# Patient Record
Sex: Male | Born: 1935
Health system: Southern US, Community
[De-identification: ages and names within clinical notes are randomized; demographics above are authoritative.]

## PROBLEM LIST (undated history)

## (undated) ENCOUNTER — Emergency Department (HOSPITAL_COMMUNITY): Payer: Medicare Other | Source: Home / Self Care

## (undated) DIAGNOSIS — R42 Dizziness and giddiness: Secondary | ICD-10-CM

## (undated) DIAGNOSIS — I2510 Atherosclerotic heart disease of native coronary artery without angina pectoris: Secondary | ICD-10-CM

## (undated) DIAGNOSIS — K219 Gastro-esophageal reflux disease without esophagitis: Secondary | ICD-10-CM

## (undated) DIAGNOSIS — R0600 Dyspnea, unspecified: Secondary | ICD-10-CM

## (undated) DIAGNOSIS — N529 Male erectile dysfunction, unspecified: Secondary | ICD-10-CM

## (undated) DIAGNOSIS — T4145XA Adverse effect of unspecified anesthetic, initial encounter: Secondary | ICD-10-CM

## (undated) DIAGNOSIS — Z86718 Personal history of other venous thrombosis and embolism: Secondary | ICD-10-CM

## (undated) DIAGNOSIS — I2699 Other pulmonary embolism without acute cor pulmonale: Secondary | ICD-10-CM

## (undated) DIAGNOSIS — Z9289 Personal history of other medical treatment: Secondary | ICD-10-CM

## (undated) DIAGNOSIS — J449 Chronic obstructive pulmonary disease, unspecified: Secondary | ICD-10-CM

## (undated) DIAGNOSIS — M1990 Unspecified osteoarthritis, unspecified site: Secondary | ICD-10-CM

## (undated) DIAGNOSIS — I714 Abdominal aortic aneurysm, without rupture, unspecified: Secondary | ICD-10-CM

## (undated) DIAGNOSIS — J189 Pneumonia, unspecified organism: Secondary | ICD-10-CM

## (undated) DIAGNOSIS — E785 Hyperlipidemia, unspecified: Secondary | ICD-10-CM

## (undated) DIAGNOSIS — T8859XA Other complications of anesthesia, initial encounter: Secondary | ICD-10-CM

## (undated) DIAGNOSIS — I219 Acute myocardial infarction, unspecified: Secondary | ICD-10-CM

## (undated) DIAGNOSIS — I10 Essential (primary) hypertension: Secondary | ICD-10-CM

## (undated) DIAGNOSIS — M549 Dorsalgia, unspecified: Secondary | ICD-10-CM

## (undated) HISTORY — DX: Other pulmonary embolism without acute cor pulmonale: I26.99

## (undated) HISTORY — DX: Hyperlipidemia, unspecified: E78.5

## (undated) HISTORY — DX: Dorsalgia, unspecified: M54.9

## (undated) HISTORY — DX: Male erectile dysfunction, unspecified: N52.9

## (undated) HISTORY — PX: OTHER SURGICAL HISTORY: SHX169

## (undated) HISTORY — DX: Personal history of other medical treatment: Z92.89

## (undated) HISTORY — DX: Atherosclerotic heart disease of native coronary artery without angina pectoris: I25.10

## (undated) HISTORY — DX: Abdominal aortic aneurysm, without rupture: I71.4

## (undated) HISTORY — DX: Personal history of other venous thrombosis and embolism: Z86.718

## (undated) HISTORY — PX: TONSILLECTOMY: SUR1361

## (undated) HISTORY — DX: Essential (primary) hypertension: I10

## (undated) HISTORY — DX: Chronic obstructive pulmonary disease, unspecified: J44.9

## (undated) HISTORY — DX: Abdominal aortic aneurysm, without rupture, unspecified: I71.40

---

## 1996-02-25 HISTORY — PX: CORONARY ARTERY BYPASS GRAFT: SHX141

## 1997-08-24 ENCOUNTER — Ambulatory Visit (HOSPITAL_COMMUNITY): Admission: RE | Admit: 1997-08-24 | Discharge: 1997-08-24 | Payer: Self-pay | Admitting: *Deleted

## 1998-07-19 ENCOUNTER — Ambulatory Visit (HOSPITAL_BASED_OUTPATIENT_CLINIC_OR_DEPARTMENT_OTHER): Admission: RE | Admit: 1998-07-19 | Discharge: 1998-07-19 | Payer: Self-pay | Admitting: General Surgery

## 1999-05-27 ENCOUNTER — Emergency Department (HOSPITAL_COMMUNITY): Admission: EM | Admit: 1999-05-27 | Discharge: 1999-05-27 | Payer: Self-pay | Admitting: Internal Medicine

## 2002-06-27 ENCOUNTER — Encounter: Payer: Self-pay | Admitting: Family Medicine

## 2002-06-27 ENCOUNTER — Ambulatory Visit (HOSPITAL_COMMUNITY): Admission: RE | Admit: 2002-06-27 | Discharge: 2002-06-27 | Payer: Self-pay | Admitting: Family Medicine

## 2003-09-07 ENCOUNTER — Ambulatory Visit (HOSPITAL_COMMUNITY): Admission: RE | Admit: 2003-09-07 | Discharge: 2003-09-07 | Payer: Self-pay | Admitting: Gastroenterology

## 2003-09-07 ENCOUNTER — Encounter (INDEPENDENT_AMBULATORY_CARE_PROVIDER_SITE_OTHER): Payer: Self-pay | Admitting: Specialist

## 2005-04-28 ENCOUNTER — Encounter: Payer: Self-pay | Admitting: Cardiology

## 2005-04-28 HISTORY — PX: CARDIOVASCULAR STRESS TEST: SHX262

## 2007-05-20 ENCOUNTER — Ambulatory Visit: Payer: Self-pay | Admitting: Surgery

## 2008-07-14 ENCOUNTER — Emergency Department (HOSPITAL_COMMUNITY): Admission: EM | Admit: 2008-07-14 | Discharge: 2008-07-14 | Payer: Self-pay | Admitting: Emergency Medicine

## 2008-10-19 ENCOUNTER — Encounter: Admission: RE | Admit: 2008-10-19 | Discharge: 2008-11-23 | Payer: Self-pay | Admitting: Orthopedic Surgery

## 2009-10-16 ENCOUNTER — Ambulatory Visit: Payer: Self-pay | Admitting: Cardiovascular Disease

## 2010-03-05 ENCOUNTER — Ambulatory Visit: Payer: Self-pay | Admitting: Cardiovascular Disease

## 2010-06-03 ENCOUNTER — Telehealth: Payer: Self-pay | Admitting: Cardiovascular Disease

## 2010-06-03 NOTE — Telephone Encounter (Signed)
Called for samples, samples provided of micardis hct 80mg /12.5mg .Alfonso Ramus RN
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
## 2010-06-04 LAB — BASIC METABOLIC PANEL
BUN: 23 mg/dL (ref 6–23)
Calcium: 8.8 mg/dL (ref 8.4–10.5)
GFR calc non Af Amer: 52 mL/min — ABNORMAL LOW (ref >60)
Potassium: 3.8 mEq/L (ref 3.5–5.1)
Sodium: 137 mEq/L (ref 135–145)

## 2010-06-04 LAB — DIFFERENTIAL
Eosinophils Absolute: 0.2 10*3/uL (ref 0.0–0.7)
Lymphocytes Relative: 26 % (ref 12–46)
Lymphs Abs: 1.4 10*3/uL (ref 0.7–4.0)
Monocytes Relative: 8 % (ref 3–12)
Neutrophils Relative %: 61 % (ref 43–77)

## 2010-06-04 LAB — CBC
HCT: 40.2 % (ref 39.0–52.0)
Hemoglobin: 13.1 g/dL (ref 13.0–17.0)
Platelets: 212 10*3/uL (ref 150–400)
RDW: 13.4 % (ref 11.5–15.5)
WBC: 5.4 10*3/uL (ref 4.0–10.5)

## 2010-07-09 NOTE — Procedures (Signed)
LOWER EXTREMITY ARTERIAL EVALUATION-EXERCISE WORKSHEET   INDICATION:  Bilateral claudication left greater than right.   HISTORY:  Diabetes:  No.  Cardiac:  CABG in 1998.  Hypertension:  Yes.  Smoking:  No, quit in 1979.  Previous Surgery:   RESTING SYSTOLIC PRESSURES: (ABI)                                RIGHT       LEFT  Brachial:                     140         140  Thigh:  Anterior tibial at ankle:     144         134  Posterior tibial at ankle:    160 (>1.0)  140 (1.0)  Peroneal at ankle:  DOPPLER WAVEFORM ANALYSIS:  Common femoral:  Popliteal:  Anterior tibial:              Triphasic   Triphasic  Posterior tibial:             Triphasic   Triphasic  Peroneal:   EXERCISE  Walking Time:  5 minutes  Speed:  Incline:  Ambulated In Hallway:  X   SYMPTOMS:  Mild bilateral groin discomfort at 4 minutes  IMPRESSION:  1.  No evidence of lower extremity arterial occlusive  disease at rest or with exercise on the right.  2.  Mild drop in left ankle pressures that returns to baseline within 2  minutes, suggesting possible mild left iliac arterial occlusive disease.   A preliminary copy was faxed to Dr. Harvie Bridge office.   POST-EXERCISE  RIGHT                      LEFT  164        immediately     124             2 min.          138             4 min.             6 min.             8 min.             10 min.     ___________________________________________  Di Kindle. Edilia Bo, M.D.   DP/MEDQ  D:  05/20/2007  T:  05/20/2007  Job:  [id number]

## 2010-07-12 NOTE — Op Note (Signed)
NAME:  Brent [name], Brent [name]                           ACCOUNT NO.:  [id number]   MEDICAL RECORD NO.:  [id number]                   PATIENT TYPE:  AMB   LOCATION:  [facility]                                 FACILITY:  [facility]   PHYSICIAN:  Graylin Shiver, M.D.                DATE OF BIRTH:  [birth year]   DATE OF PROCEDURE:  09/07/2003  DATE OF DISCHARGE:                                 OPERATIVE REPORT   PROCEDURE:  Colonoscopy with polypectomy and biopsy.,   INDICATIONS:  Rectal bleeding, family history of colon polyps.   Informed consent was obtained after explanation of the risks of bleeding,  infection, and perforation.   PREMEDICATION:  Fentanyl 60 mcg IV, Versed 6 mg IV.   PROCEDURE:  With the patient in the left lateral decubitus position, a  rectal exam was performed.  No masses were felt.  The Olympus colonoscope  was inserted into the rectum and advanced around the colon to the cecum.  Cecal landmarks were identified.  The cecum and ascending colon were normal.  In the transverse colon there was a small 3 mm sessile polyp biopsied off  with cold forceps.  The descending colon looked normal.  The sigmoid  revealed diverticulosis.  Also in the sigmoid was a 5 mm sessile polyp.  This was snared with a mini-snare and removed by snare cautery technique.  The cautery site looked good and the polyp was retrieved.  The rectum looked  normal.  He tolerated the procedure well without complications.   IMPRESSION:  1. Diverticulosis of the sigmoid colon.  2. Two small colon polyps as described above.   PLAN:  The pathology will be checked.                                               Graylin Shiver, M.D.    SFG/MEDQ  D:  09/07/2003  T:  09/07/2003  Job:  [id number]   cc:   Meredith Staggers, M.D.  [address]  [place]  [place] [address]  Fax: [phone]

## 2010-09-24 ENCOUNTER — Other Ambulatory Visit: Payer: Self-pay | Admitting: Surgery

## 2010-10-25 ENCOUNTER — Other Ambulatory Visit: Payer: Self-pay | Admitting: Cardiovascular Disease

## 2010-11-08 ENCOUNTER — Other Ambulatory Visit: Payer: Self-pay | Admitting: Cardiovascular Disease

## 2010-12-04 ENCOUNTER — Other Ambulatory Visit: Payer: Self-pay | Admitting: Family Medicine

## 2010-12-04 DIAGNOSIS — N5089 Other specified disorders of the male genital organs: Secondary | ICD-10-CM

## 2010-12-16 ENCOUNTER — Ambulatory Visit
Admission: RE | Admit: 2010-12-16 | Discharge: 2010-12-16 | Disposition: A | Discharge status: Home / Self Care | Payer: Medicare Other | Source: Ambulatory Visit | Attending: Family Medicine | Admitting: Family Medicine

## 2010-12-16 DIAGNOSIS — N5089 Other specified disorders of the male genital organs: Secondary | ICD-10-CM

## 2011-01-24 ENCOUNTER — Other Ambulatory Visit: Payer: Self-pay | Admitting: Cardiovascular Disease

## 2011-02-11 ENCOUNTER — Telehealth: Payer: Self-pay | Admitting: Cardiovascular Disease

## 2011-02-11 NOTE — Telephone Encounter (Signed)
Called requesting samples of Crestor 20 mg. He is in donut hole. Have available. Will leave at front desk.

## 2011-02-11 NOTE — Telephone Encounter (Signed)
New message:  Pt needs samples of Crestor to get him thru the doughnut hole.  Please call her and let her know if there are any samples.  [phone]

## 2011-02-26 ENCOUNTER — Other Ambulatory Visit: Payer: Self-pay | Admitting: *Deleted

## 2011-02-26 NOTE — Telephone Encounter (Signed)
Opened in Error.

## 2011-03-10 ENCOUNTER — Encounter: Payer: Self-pay | Admitting: Cardiovascular Disease

## 2011-03-10 ENCOUNTER — Encounter: Payer: Self-pay | Admitting: *Deleted

## 2011-03-17 ENCOUNTER — Ambulatory Visit (INDEPENDENT_AMBULATORY_CARE_PROVIDER_SITE_OTHER): Payer: Medicare Other | Admitting: Cardiovascular Disease

## 2011-03-17 ENCOUNTER — Encounter: Payer: Self-pay | Admitting: Cardiovascular Disease

## 2011-03-17 DIAGNOSIS — I10 Essential (primary) hypertension: Secondary | ICD-10-CM

## 2011-03-17 DIAGNOSIS — E785 Hyperlipidemia, unspecified: Secondary | ICD-10-CM

## 2011-03-17 DIAGNOSIS — I2510 Atherosclerotic heart disease of native coronary artery without angina pectoris: Secondary | ICD-10-CM | POA: Insufficient documentation

## 2011-03-17 LAB — BASIC METABOLIC PANEL
CO2: 27 mEq/L (ref 19–32)
Calcium: 8.9 mg/dL (ref 8.4–10.5)
Chloride: 102 mEq/L (ref 96–112)
Glucose, Bld: 92 mg/dL (ref 70–99)
Sodium: 139 mEq/L (ref 135–145)

## 2011-03-17 LAB — HEPATIC FUNCTION PANEL
Albumin: 3.8 g/dL (ref 3.5–5.2)
Total Bilirubin: 0.5 mg/dL (ref 0.3–1.2)

## 2011-03-17 LAB — LIPID PANEL
HDL: 47 mg/dL (ref >39.00)
LDL Cholesterol: 98 mg/dL (ref 0–99)
Total CHOL/HDL Ratio: 3
VLDL: 15.6 mg/dL (ref 0.0–40.0)

## 2011-03-17 MED ORDER — NITROGLYCERIN 0.4 MG SL SUBL: 0.4000 mg | SUBLINGUAL_TABLET | SUBLINGUAL | Status: DC | PRN

## 2011-03-17 NOTE — Assessment & Plan Note (Signed)
Old Inferior wall MI and severe 3 vessel CAD diagnosed 06/17/1996 by cath.  He had coronary artery bypass grafting shortly after that.  He is done very well. He's not had any episodes of chest pain or shortness of breath. I've asked him to continue to get regular exercise. CABG

## 2011-03-17 NOTE — Progress Notes (Signed)
Brent [name] Date of Birth  January 31, 1932 [facility]     [place] Office  [address]    [address]   [address] [place], [place]  [address]    [place], [place]  [address] [phone]  Fax  [phone]  [phone]  Fax [phone]   History of Present Illness:  Brent [name] is 78 y.o. gentleman with a hx of CAD/CABG, hyperlipidemia, HTN.  He has not had any chest pain.  He has been having lots of left hip pain. He had a steroid injection which helped quite a bit but it appears that the injection has now worn off.  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  Current Outpatient Prescriptions on File Prior to Visit  Medication Sig Dispense Refill  . Acetaminophen (TYLENOL PO) Take by mouth as needed.       Marland Kitchen amLODipine (NORVASC) 5 MG tablet TAKE ONE (1) TABLET EACH DAY  30 tablet  4  . Ascorbic Acid (VITAMIN C PO) Take by mouth daily.      . ASPIRIN PO Take by mouth daily.      . famotidine (PEPCID) 10 MG tablet Take 10 mg by mouth daily.      . Glucosamine-Chondroitin (COSAMIN DS PO) Take by mouth daily.      Marland Kitchen MICARDIS HCT 80-25 MG per tablet TAKE 1 TABLET BY MOUTH ONCE DAILY  30 tablet  1  . Multiple Vitamins-Minerals (MULTIVITAMIN PO) Take by mouth daily.      . Omega-3 Fatty Acids (FISH OIL PO) Take by mouth 2 (two) times daily.      . rosuvastatin (CRESTOR) 20 MG tablet Take 20 mg by mouth daily.          Allergies  Allergen Reactions  . Morphine And Related     Itch     Past Medical History  Diagnosis Date  . HTN (hypertension)   . CAD (coronary artery disease)     Status post CABG  . Hyperlipidemia   . Back pain   . Erectile dysfunction   . History of blood clots     to the lungs   . Pulmonary embolism     Past Surgical History  Procedure Date  . Coronary artery bypass graft 1998    there are sequential 90% stenosis in the proximal LAD, and the mid and distal LAD is a moderate sized vessel. The mid and first diagonal branch has a  50-60% stenosis.   . Cardiovascular stress test 04-28-2005    EF 53%  . Broken jaw     20+ years ago  . Broken ankle     History  Smoking status  . Former Smoker  Smokeless tobacco  . Not on file    History  Alcohol Use     No family history on file.  Reviw of Systems:  Reviewed in the HPI.  All other systems are negative.  Physical Exam: BP 141/79  Pulse 61  Ht 6\' 2"  (1.88 m)  Wt 253 lb 12.8 oz (115.123 kg)  BMI 32.59 kg/m2 The patient is alert and oriented x 3.  The mood and affect are normal.   Skin: warm and dry.  Color is normal.    HEENT:   Normocephalic/atraumatic. His normal carotids. There is no JVD. His neck is supple.  Lungs: Lungs are clear.   Heart: Regular rate S1-S2    Abdomen: Good bowel sounds. No hepatosplenomegaly  Extremities:  No clubbing cyanosis  or edema.  Neuro:  Neuro exam is nonfocal.    ECG: Sinus bradycardia. He has a first degree AV block. There is an old inferior wall myocardial infarction.    Assessment / Plan:

## 2011-03-17 NOTE — Patient Instructions (Signed)
Please have fasting lab work today.  The current medical regimen is effective;  continue present plan and medications.  Follow up in 6 months with Dr Elease Hashimoto.  You will receive a letter in the mail 2 months before you are due.  Please call us when you receive this letter to schedule your follow up appointment.  Your physician recommends that you return for lab work in: 6 months (fasting Lipid, hepatic and BMP)

## 2011-03-17 NOTE — Assessment & Plan Note (Signed)
BP has been fairly well controlled.  

## 2011-03-17 NOTE — Assessment & Plan Note (Signed)
We will draw fasting labs today. I'll see him again in 6 months and we'll draw fasting labs at that time as well.

## 2011-03-18 ENCOUNTER — Telehealth: Payer: Self-pay | Admitting: *Deleted

## 2011-03-18 DIAGNOSIS — E785 Hyperlipidemia, unspecified: Secondary | ICD-10-CM

## 2011-03-18 MED ORDER — ROSUVASTATIN CALCIUM 40 MG PO TABS: 40.0000 mg | ORAL_TABLET | Freq: Every day | ORAL | Status: DC

## 2011-03-18 NOTE — Telephone Encounter (Signed)
Message copied by Antony Odea on Tue Mar 18, 2011  2:51 PM ------      Message from: Vesta Mixer      Created: Mon Mar 17, 2011  5:09 PM       His LDL is too high - goal is < 70.  Please have him increase crestor to 40 a day.            Creatinine is slightly higher, have him drink more water

## 2011-03-18 NOTE — Telephone Encounter (Signed)
Patient called with lab results. meds increased, 3 month fasting lab date set, Pt verbalized understanding. Alfonso Ramus RN

## 2011-04-25 ENCOUNTER — Other Ambulatory Visit: Payer: Self-pay | Admitting: Cardiovascular Disease

## 2011-04-25 NOTE — Telephone Encounter (Signed)
Fax Received. Refill Completed. Brent [name] (R.M.A)   

## 2011-05-28 ENCOUNTER — Other Ambulatory Visit: Payer: Self-pay | Admitting: Family Medicine

## 2011-05-28 ENCOUNTER — Ambulatory Visit
Admission: RE | Admit: 2011-05-28 | Discharge: 2011-05-28 | Disposition: A | Discharge status: Home / Self Care | Payer: Medicare Other | Source: Ambulatory Visit | Attending: Family Medicine | Admitting: Family Medicine

## 2011-05-30 ENCOUNTER — Encounter: Payer: Self-pay | Admitting: *Deleted

## 2011-06-17 ENCOUNTER — Telehealth: Payer: Self-pay | Admitting: *Deleted

## 2011-06-17 ENCOUNTER — Other Ambulatory Visit (INDEPENDENT_AMBULATORY_CARE_PROVIDER_SITE_OTHER): Payer: Medicare Other

## 2011-06-17 ENCOUNTER — Encounter: Payer: Self-pay | Admitting: *Deleted

## 2011-06-17 DIAGNOSIS — E785 Hyperlipidemia, unspecified: Secondary | ICD-10-CM

## 2011-06-17 LAB — LIPID PANEL
HDL: 49.9 mg/dL (ref >39.00)
Total CHOL/HDL Ratio: 3
Triglycerides: 89 mg/dL (ref 0.0–149.0)
VLDL: 17.8 mg/dL (ref 0.0–40.0)

## 2011-06-17 LAB — BASIC METABOLIC PANEL
CO2: 27 mEq/L (ref 19–32)
Chloride: 105 mEq/L (ref 96–112)
Glucose, Bld: 101 mg/dL — ABNORMAL HIGH (ref 70–99)
Potassium: 4.2 mEq/L (ref 3.5–5.1)
Sodium: 140 mEq/L (ref 135–145)

## 2011-06-17 LAB — HEPATIC FUNCTION PANEL
Albumin: 4 g/dL (ref 3.5–5.2)
Total Bilirubin: 0.5 mg/dL (ref 0.3–1.2)

## 2011-06-17 NOTE — Telephone Encounter (Signed)
Brent [name] was seen in January for follow up of CAD.  He has not had any angina.  He should be at low risk for his upcoming endoscopic procedure.  I would prefer that he continue aspirin 81 mg a day if that is possible.    Vesta Mixer, Montez Hageman., MD, [facility] 06/17/2011, 11:24 AM

## 2011-06-17 NOTE — Telephone Encounter (Signed)
Noted, pt informed, letter sent.

## 2011-06-17 NOTE — Telephone Encounter (Signed)
Pt walked in, needs cardiac clearance for general anesthesia/ over night stay for Endoscopic Zenkers diverticulectomy. Dr Pollyann Kennedy needs the clearance. Does he need an app, last ov 03/17/11, please review/advise.

## 2011-06-23 ENCOUNTER — Encounter (HOSPITAL_COMMUNITY): Payer: Self-pay | Admitting: Pharmacy Technician

## 2011-06-24 ENCOUNTER — Encounter (HOSPITAL_COMMUNITY)
Admission: RE | Admit: 2011-06-24 | Discharge: 2011-06-24 | Disposition: A | Discharge status: Home / Self Care | Payer: Medicare Other | Source: Ambulatory Visit | Attending: Otolaryngology | Admitting: Otolaryngology

## 2011-06-24 ENCOUNTER — Encounter (HOSPITAL_COMMUNITY): Payer: Self-pay

## 2011-06-24 ENCOUNTER — Encounter (HOSPITAL_COMMUNITY)
Admission: RE | Admit: 2011-06-24 | Discharge: 2011-06-24 | Disposition: A | Discharge status: Home / Self Care | Payer: Medicare Other | Source: Ambulatory Visit | Attending: Anesthesiology | Admitting: Anesthesiology

## 2011-06-24 LAB — CBC
HCT: 39.3 % (ref 39.0–52.0)
Platelets: 195 10*3/uL (ref 150–400)
RBC: 4.27 MIL/uL (ref 4.22–5.81)
RDW: 13.1 % (ref 11.5–15.5)
WBC: 5.3 10*3/uL (ref 4.0–10.5)

## 2011-06-24 LAB — SURGICAL PCR SCREEN
MRSA, PCR: NEGATIVE
Staphylococcus aureus: NEGATIVE

## 2011-06-24 LAB — BASIC METABOLIC PANEL
CO2: 24 mEq/L (ref 19–32)
Chloride: 102 mEq/L (ref 96–112)
Creatinine, Ser: 1.52 mg/dL — ABNORMAL HIGH (ref 0.50–1.35)
GFR calc Af Amer: 49 mL/min — ABNORMAL LOW (ref >90)
Sodium: 139 mEq/L (ref 135–145)

## 2011-06-24 MED ORDER — CEFAZOLIN SODIUM 1-5 GM-% IV SOLN: 1.0000 g | INTRAVENOUS | Status: DC

## 2011-06-24 MED ORDER — CEFAZOLIN SODIUM-DEXTROSE 2-3 GM-% IV SOLR
2.0000 g | INTRAVENOUS | Status: AC
  Administered 2011-06-25: 2 g via INTRAVENOUS
  Filled 2011-06-24: qty 50

## 2011-06-24 NOTE — H&P (Signed)
  EJ PINSON is an 78 y.o. male.   Chief Complaint: Dysphagia HPI: One year history of dysphagia, Zenker's found on barium swallow.  Past Medical History  Diagnosis Date  . HTN (hypertension)   . CAD (coronary artery disease)     Status post CABG  . Hyperlipidemia   . Back pain   . Erectile dysfunction   . History of blood clots     to the lungs   . Pulmonary embolism     Past Surgical History  Procedure Date  . Coronary artery bypass graft 1998    there are sequential 90% stenosis in the proximal LAD, and the mid and distal LAD is a moderate sized vessel. The mid and first diagonal branch has a 50-60% stenosis.   . Cardiovascular stress test 04-28-2005    EF 53%  . Broken jaw     20+ years ago  . Broken ankle     No family history on file. Social History:  reports that he has quit smoking. He does not have any smokeless tobacco history on file. His alcohol and drug histories not on file.  Allergies:  Allergies  Allergen Reactions  . Morphine And Related     Itch     No prescriptions prior to admission    No results found for this or any previous visit (from the past 48 hour(s)). No results found.  ROS: otherwise negative  There were no vitals taken for this visit.  PHYSICAL EXAM: Overall appearance:  Healthy appearing, in no distress Head:  Normocephalic, atraumatic. Ears: External auditory canals are clear; tympanic membranes are intact and the middle ears are free of any effusion. Nose: External nose is healthy in appearance. Internal nasal exam free of any lesions or obstruction. Oral Cavity:  There are no mucosal lesions or masses identified. Small mouth, slightly retrognathic. Oral Pharynx/Hypopharynx/Larynx: no signs of any mucosal lesions or masses identified. Vocal cords move normally. Neuro:  No identifiable neurologic deficits. Neck: No palpable neck masses.  Studies Reviewed: none    Assessment/Plan Endoscopic Zenker's  diverticulectomy.  Miaa Latterell 06/24/2011, 9:01 AM

## 2011-06-25 ENCOUNTER — Encounter (HOSPITAL_COMMUNITY): Payer: Self-pay | Admitting: Anesthesiology

## 2011-06-25 ENCOUNTER — Ambulatory Visit (HOSPITAL_COMMUNITY): Payer: Medicare Other | Admitting: Anesthesiology

## 2011-06-25 ENCOUNTER — Encounter (HOSPITAL_COMMUNITY)
Admission: RE | Disposition: A | Discharge status: Home / Self Care | Payer: Self-pay | Source: Ambulatory Visit | Attending: Otolaryngology

## 2011-06-25 ENCOUNTER — Ambulatory Visit (HOSPITAL_COMMUNITY)
Admission: RE | Admit: 2011-06-25 | Discharge: 2011-06-25 | Disposition: A | Discharge status: Home / Self Care | Payer: Medicare Other | Source: Ambulatory Visit | Attending: Otolaryngology | Admitting: Otolaryngology

## 2011-06-25 ENCOUNTER — Encounter (HOSPITAL_COMMUNITY): Payer: Self-pay | Admitting: *Deleted

## 2011-06-25 DIAGNOSIS — R0602 Shortness of breath: Secondary | ICD-10-CM | POA: Insufficient documentation

## 2011-06-25 DIAGNOSIS — Z01812 Encounter for preprocedural laboratory examination: Secondary | ICD-10-CM | POA: Insufficient documentation

## 2011-06-25 DIAGNOSIS — I2510 Atherosclerotic heart disease of native coronary artery without angina pectoris: Secondary | ICD-10-CM | POA: Insufficient documentation

## 2011-06-25 DIAGNOSIS — Z5309 Procedure and treatment not carried out because of other contraindication: Secondary | ICD-10-CM | POA: Insufficient documentation

## 2011-06-25 DIAGNOSIS — K225 Diverticulum of esophagus, acquired: Secondary | ICD-10-CM

## 2011-06-25 DIAGNOSIS — E119 Type 2 diabetes mellitus without complications: Secondary | ICD-10-CM | POA: Insufficient documentation

## 2011-06-25 DIAGNOSIS — I10 Essential (primary) hypertension: Secondary | ICD-10-CM | POA: Insufficient documentation

## 2011-06-25 DIAGNOSIS — N289 Disorder of kidney and ureter, unspecified: Secondary | ICD-10-CM | POA: Insufficient documentation

## 2011-06-25 HISTORY — PX: ESOPHAGOSCOPY: SHX5534

## 2011-06-25 SURGERY — ESOPHAGOSCOPY
Anesthesia: General | Site: Mouth | Wound class: Clean Contaminated

## 2011-06-25 MED ORDER — NEOSTIGMINE METHYLSULFATE 1 MG/ML IJ SOLN
INTRAMUSCULAR | Status: DC | PRN
  Administered 2011-06-25: 5 mg via INTRAVENOUS

## 2011-06-25 MED ORDER — PROPOFOL 10 MG/ML IV EMUL
INTRAVENOUS | Status: DC | PRN
  Administered 2011-06-25: 30 mg via INTRAVENOUS
  Administered 2011-06-25: 150 mg via INTRAVENOUS

## 2011-06-25 MED ORDER — GLYCOPYRROLATE 0.2 MG/ML IJ SOLN
INTRAMUSCULAR | Status: DC | PRN
  Administered 2011-06-25: .8 mg via INTRAVENOUS

## 2011-06-25 MED ORDER — DROPERIDOL 2.5 MG/ML IJ SOLN: 0.6250 mg | INTRAMUSCULAR | Status: DC | PRN

## 2011-06-25 MED ORDER — ROCURONIUM BROMIDE 100 MG/10ML IV SOLN
INTRAVENOUS | Status: DC | PRN
  Administered 2011-06-25: 10 mg via INTRAVENOUS
  Administered 2011-06-25: 50 mg via INTRAVENOUS

## 2011-06-25 MED ORDER — ONDANSETRON HCL 4 MG/2ML IJ SOLN
INTRAMUSCULAR | Status: DC | PRN
  Administered 2011-06-25: 4 mg via INTRAVENOUS

## 2011-06-25 MED ORDER — HYDROMORPHONE HCL PF 1 MG/ML IJ SOLN: 0.2500 mg | INTRAMUSCULAR | Status: DC | PRN

## 2011-06-25 MED ORDER — SUCCINYLCHOLINE CHLORIDE 20 MG/ML IJ SOLN
INTRAMUSCULAR | Status: DC | PRN
  Administered 2011-06-25: 120 mg via INTRAVENOUS

## 2011-06-25 MED ORDER — MIDAZOLAM HCL 5 MG/5ML IJ SOLN
INTRAMUSCULAR | Status: DC | PRN
  Administered 2011-06-25: 1 mg via INTRAVENOUS

## 2011-06-25 MED ORDER — LACTATED RINGERS IV SOLN
INTRAVENOUS | Status: DC | PRN
  Administered 2011-06-25: 08:00:00 via INTRAVENOUS

## 2011-06-25 MED ORDER — FENTANYL CITRATE 0.05 MG/ML IJ SOLN
INTRAMUSCULAR | Status: DC | PRN
  Administered 2011-06-25 (×3): 50 ug via INTRAVENOUS

## 2011-06-25 MED ORDER — EPHEDRINE SULFATE 50 MG/ML IJ SOLN
INTRAMUSCULAR | Status: DC | PRN
  Administered 2011-06-25 (×2): 5 mg via INTRAVENOUS

## 2011-06-25 MED ORDER — 0.9 % SODIUM CHLORIDE (POUR BTL) OPTIME
TOPICAL | Status: DC | PRN
  Administered 2011-06-25: 1000 mL

## 2011-06-25 SURGICAL SUPPLY — 26 items
CATH ROBINSON RED A/P 18FR (CATHETERS) ×2 IMPLANT
CLOTH BEACON ORANGE TIMEOUT ST (SAFETY) ×3 IMPLANT
COVER SURGICAL LIGHT HANDLE (MISCELLANEOUS) ×3 IMPLANT
COVER TABLE BACK 60X90 (DRAPES) ×3 IMPLANT
CUTTER LINEAR ENDO 35 ETS (STAPLE) ×1 IMPLANT
DRAPE PROXIMA HALF (DRAPES) ×3 IMPLANT
ELECT PAD GROUND ADT 9 (MISCELLANEOUS) ×2 IMPLANT
GLOVE BIO SURGEON STRL SZ 6.5 (GLOVE) ×2 IMPLANT
GLOVE BIOGEL PI IND STRL 6.5 (GLOVE) ×2 IMPLANT
GLOVE BIOGEL PI IND STRL 8 (GLOVE) ×1 IMPLANT
GLOVE BIOGEL PI INDICATOR 6.5 (GLOVE) ×1
GLOVE BIOGEL PI INDICATOR 8 (GLOVE) ×1
GLOVE ECLIPSE 7.5 STRL STRAW (GLOVE) ×3 IMPLANT
GOWN STRL NON-REIN LRG LVL3 (GOWN DISPOSABLE) ×8 IMPLANT
GUARD TEETH (MISCELLANEOUS) ×5 IMPLANT
KIT ROOM TURNOVER OR (KITS) ×3 IMPLANT
NS IRRIG 1000ML POUR BTL (IV SOLUTION) ×3 IMPLANT
PAD ARMBOARD 7.5X6 YLW CONV (MISCELLANEOUS) ×6 IMPLANT
RELOAD CUTTER ETS 35MM STAND (ENDOMECHANICALS) IMPLANT
SOLUTION ANTI FOG 6CC (MISCELLANEOUS) ×3 IMPLANT
SPONGE GAUZE 4X4 12PLY (GAUZE/BANDAGES/DRESSINGS) ×3 IMPLANT
SURGILUBE 2OZ TUBE FLIPTOP (MISCELLANEOUS) ×3 IMPLANT
TOWEL OR 17X24 6PK STRL BLUE (TOWEL DISPOSABLE) ×3 IMPLANT
TOWEL OR 17X26 10 PK STRL BLUE (TOWEL DISPOSABLE) ×2 IMPLANT
TUBE CONNECTING 12X1/4 (SUCTIONS) ×3 IMPLANT
WATER STERILE IRR 1000ML POUR (IV SOLUTION) ×3 IMPLANT

## 2011-06-25 NOTE — Anesthesia Postprocedure Evaluation (Signed)
  Anesthesia Post-op Note  Patient: Brent [name]  Procedure(s) Performed: Procedure(s) (LRB): ESOPHAGOSCOPY (N/A)  Patient Location: PACU  Anesthesia Type: General  Level of Consciousness: awake, alert  and oriented  Airway and Oxygen Therapy: Patient Spontanous Breathing  Post-op Pain: none  Post-op Assessment: Post-op Vital signs reviewed, Patient's Cardiovascular Status Stable, Respiratory Function Stable, Patent Airway, No signs of Nausea or vomiting, Adequate PO intake, Pain level controlled and No headache  Post-op Vital Signs: Reviewed and stable  Complications: No apparent anesthesia complications

## 2011-06-25 NOTE — Interval H&P Note (Signed)
History and Physical Interval Note:  06/25/2011 8:25 AM  Brent [name]  has presented today for surgery, with the diagnosis of ZINKER'S DIVERTICULUM  The various methods of treatment have been discussed with the patient and family. After consideration of risks, benefits and other options for treatment, the patient has consented to  Procedure(s) (LRB): ZENKER'S DIVERTICULECTOMY ENDOSCOPIC (N/A) as a surgical intervention .  The patients' history has been reviewed, patient examined, no change in status, stable for surgery.  I have reviewed the patients' chart and labs.  Questions were answered to the patient's satisfaction.     Selen Smucker

## 2011-06-25 NOTE — Transfer of Care (Signed)
Immediate Anesthesia Transfer of Care Note  Patient: Brent [name]  Procedure(s) Performed: Procedure(s) (LRB): ESOPHAGOSCOPY (N/A)  Patient Location: PACU  Anesthesia Type: General  Level of Consciousness: awake  Airway & Oxygen Therapy: Patient Spontanous Breathing and Patient connected to nasal cannula oxygen  Post-op Assessment: Report given to PACU RN and Post -op Vital signs reviewed and stable  Post vital signs: Reviewed and stable  Complications: No apparent anesthesia complications

## 2011-06-25 NOTE — Preoperative (Signed)
Beta Blockers   Reason not to administer Beta Blockers:Not Applicable 

## 2011-06-25 NOTE — Addendum Note (Signed)
Addendum  created 06/25/11 1059 by Remonia Richter, MD   Modules edited:Anesthesia Attestations

## 2011-06-25 NOTE — Op Note (Signed)
OPERATIVE REPORT  DATE OF SURGERY: 06/25/2011  PATIENT:  Brent [name],  78 y.o. male  PRE-OPERATIVE DIAGNOSIS:  ZINKER'S DIVERTICULUM  POST-OPERATIVE DIAGNOSIS:  ZINKER'S DIVERTICULUM  PROCEDURE:  Procedure(s): ZENKER'S DIVERTICULECTOMY ENDOSCOPIC  SURGEON:  Susy Frizzle, MD  ASSISTANTS: none   ANESTHESIA:   general  EBL:  0 ml  DRAINS: none   LOCAL MEDICATIONS USED:  NONE  SPECIMEN:  No Specimen  COUNTS:  YES  PROCEDURE DETAILS: The patient was taken to the operating room and placed on the operating table in the supine position. Following induction of general endotracheal anesthesia, the table was turned 90. The patient was draped in a standard fashion with head drape and sterile drapes. Maxillary and mandibular tooth protector were used throughout the case. The Wierda diverticuloscope was entered into the oral cavity and multiple attempts were made to position the blades of the scope around the cricopharyngeus. The patient had a small mouth, full dentition, with slightly retrognathic, and neck mobility was limited, thus making it very difficult to position the scope properly to visualize the diverticulum and the cricopharyngeus. Attempts were made with a red rubber catheter, 18-gauge, passing down the esophagus, as well as using a Jako laryngoscope and a cervical esophagoscope to help confirm the positioning of the esophagus, but all these efforts were still unsuccessful. The case was aborted. The scope was removed and the patient was awakened, extubated and transferred to recovery in stable condition.   PATIENT DISPOSITION:  PACU - hemodynamically stable.

## 2011-06-25 NOTE — Anesthesia Preprocedure Evaluation (Addendum)
Anesthesia Evaluation  Patient identified by MRN, date of birth, ID band Patient awake    Reviewed: Allergy & Precautions, H&P , NPO status , Patient's Chart, lab work & pertinent test results  Airway Mallampati: II TM Distance: >3 FB     Dental  (+) Dental Advisory Given   Pulmonary shortness of breath and with exertion,  breath sounds clear to auscultation  Pulmonary exam normal       Cardiovascular hypertension, Pt. on medications + CAD Rhythm:Regular Rate:Normal  S/p CABG 13 years ago denies chest pain does not use Nitro saw cardiologist last month    Neuro/Psych    GI/Hepatic negative GI ROS, Neg liver ROS, Zenkers Diverticulum    Endo/Other  Diabetes mellitus-, Oral Hypoglycemic Agents  Renal/GU Renal InsufficiencyRenal disease     Musculoskeletal   Abdominal   Peds  Hematology   Anesthesia Other Findings   Reproductive/Obstetrics                         Anesthesia Physical Anesthesia Plan  ASA: III  Anesthesia Plan: General   Post-op Pain Management:    Induction: Intravenous  Airway Management Planned: Oral ETT  Additional Equipment:   Intra-op Plan:   Post-operative Plan: Extubation in OR  Informed Consent:   Dental advisory given  Plan Discussed with: Anesthesiologist, CRNA and Surgeon  Anesthesia Plan Comments:        Anesthesia Quick Evaluation

## 2011-06-25 NOTE — Anesthesia Procedure Notes (Signed)
Procedure Name: Intubation Date/Time: 06/25/2011 8:34 AM Performed by: Sherie Don Pre-anesthesia Checklist: Patient identified, Emergency Drugs available, Suction available, Patient being monitored and Timeout performed Patient Re-evaluated:Patient Re-evaluated prior to inductionOxygen Delivery Method: Circle system utilized Preoxygenation: Pre-oxygenation with 100% oxygen Intubation Type: Rapid sequence, Cricoid Pressure applied and IV induction Laryngoscope Size: Miller and 2 Grade View: Grade III Tube type: Oral Tube size: 7.5 mm Number of attempts: 1 Airway Equipment and Method: Stylet Placement Confirmation: ETT inserted through vocal cords under direct vision,  positive ETCO2 and breath sounds checked- equal and bilateral Secured at: 24 cm Tube secured with: Tape Dental Injury: Teeth and Oropharynx as per pre-operative assessment and Injury to lip

## 2011-06-25 NOTE — Discharge Instructions (Signed)
Resume a regular diet as tolerated. I will make arrangements for consultation at one of the local universities.

## 2011-06-26 ENCOUNTER — Encounter (HOSPITAL_COMMUNITY): Payer: Self-pay | Admitting: Otolaryngology

## 2011-08-20 ENCOUNTER — Other Ambulatory Visit: Payer: Self-pay | Admitting: *Deleted

## 2011-08-20 MED ORDER — AMLODIPINE BESYLATE 5 MG PO TABS: 5.0000 mg | ORAL_TABLET | Freq: Every day | ORAL | Status: DC

## 2011-08-20 NOTE — Telephone Encounter (Signed)
Opened in Error.

## 2011-08-20 NOTE — Telephone Encounter (Signed)
Fax Received. Refill Completed. Dameon Soltis Chowoe (R.M.A)   

## 2011-09-18 ENCOUNTER — Other Ambulatory Visit: Payer: Self-pay | Admitting: Cardiovascular Disease

## 2011-09-18 NOTE — Telephone Encounter (Signed)
..   Requested Prescriptions   Pending Prescriptions Disp Refills  . CRESTOR 40 MG tablet [Pharmacy Med Name: rosuvastatin (CRESTOR) 40 MG tablet] 30 tablet 3    Sig: TAKE 1 TABLET BY MOUTH DAILY  Please call the office to make appointment

## 2011-10-22 ENCOUNTER — Other Ambulatory Visit: Payer: Self-pay | Admitting: Cardiovascular Disease

## 2011-10-22 MED ORDER — TELMISARTAN 80 MG PO TABS: 80.0000 mg | ORAL_TABLET | Freq: Every day | ORAL | Status: DC

## 2011-10-22 NOTE — Telephone Encounter (Signed)
Fax Received. Refill Completed. Brent [name] (R.M.A)   

## 2011-11-26 ENCOUNTER — Encounter: Payer: Self-pay | Admitting: Cardiovascular Disease

## 2012-01-20 ENCOUNTER — Other Ambulatory Visit: Payer: Self-pay | Admitting: *Deleted

## 2012-01-20 MED ORDER — ROSUVASTATIN CALCIUM 40 MG PO TABS: 40.0000 mg | ORAL_TABLET | Freq: Every day | ORAL | Status: DC

## 2012-01-20 NOTE — Telephone Encounter (Signed)
Pt needs appointment then refill can be made Fax Received. Refill Completed. Epiphany Seltzer Chowoe (R.M.A)   

## 2012-03-17 ENCOUNTER — Other Ambulatory Visit: Payer: Self-pay | Admitting: *Deleted

## 2012-03-17 MED ORDER — AMLODIPINE BESYLATE 5 MG PO TABS: 5.0000 mg | ORAL_TABLET | Freq: Every day | ORAL | Status: DC

## 2012-03-17 NOTE — Telephone Encounter (Signed)
Fax Received. Refill Completed. Trystin Hargrove Chowoe (R.M.A)   

## 2012-04-01 ENCOUNTER — Telehealth: Payer: Self-pay | Admitting: Cardiovascular Disease

## 2012-04-01 DIAGNOSIS — E785 Hyperlipidemia, unspecified: Secondary | ICD-10-CM

## 2012-04-01 NOTE — Telephone Encounter (Signed)
LAB DATE SET/ ORDERS SENT

## 2012-04-01 NOTE — Telephone Encounter (Signed)
Pt wants to know if he needs blood work at next visit? pls call  [phone]

## 2012-04-16 ENCOUNTER — Other Ambulatory Visit (INDEPENDENT_AMBULATORY_CARE_PROVIDER_SITE_OTHER): Payer: Medicare Other

## 2012-04-16 DIAGNOSIS — E785 Hyperlipidemia, unspecified: Secondary | ICD-10-CM

## 2012-04-16 LAB — BASIC METABOLIC PANEL
CO2: 27 mEq/L (ref 19–32)
Calcium: 9.1 mg/dL (ref 8.4–10.5)
Creatinine, Ser: 1.5 mg/dL (ref 0.4–1.5)
GFR: 49.97 mL/min — ABNORMAL LOW (ref >60.00)

## 2012-04-16 LAB — LIPID PANEL
Cholesterol: 146 mg/dL (ref 0–200)
Total CHOL/HDL Ratio: 3
Triglycerides: 107 mg/dL (ref 0.0–149.0)

## 2012-04-16 LAB — HEPATIC FUNCTION PANEL
AST: 23 U/L (ref 0–37)
Albumin: 3.7 g/dL (ref 3.5–5.2)
Alkaline Phosphatase: 76 U/L (ref 39–117)

## 2012-04-19 ENCOUNTER — Ambulatory Visit (INDEPENDENT_AMBULATORY_CARE_PROVIDER_SITE_OTHER): Payer: Medicare Other | Admitting: Cardiovascular Disease

## 2012-04-19 ENCOUNTER — Encounter: Payer: Self-pay | Admitting: Cardiovascular Disease

## 2012-04-19 VITALS — BP 142/84 | HR 61 | Ht 74.0 in | Wt 256.4 lb

## 2012-04-19 DIAGNOSIS — I2510 Atherosclerotic heart disease of native coronary artery without angina pectoris: Secondary | ICD-10-CM

## 2012-04-19 DIAGNOSIS — R0609 Other forms of dyspnea: Secondary | ICD-10-CM

## 2012-04-19 DIAGNOSIS — R0600 Dyspnea, unspecified: Secondary | ICD-10-CM

## 2012-04-19 DIAGNOSIS — Z951 Presence of aortocoronary bypass graft: Secondary | ICD-10-CM

## 2012-04-19 MED ORDER — POTASSIUM CHLORIDE ER 10 MEQ PO TBCR: EXTENDED_RELEASE_TABLET | ORAL | Status: DC

## 2012-04-19 MED ORDER — FUROSEMIDE 40 MG PO TABS: ORAL_TABLET | ORAL | Status: DC

## 2012-04-19 NOTE — Progress Notes (Signed)
Brent [name] Date of Birth  November 24, 1931 [facility]     [place] Office  [address]    [address]   [address] [place], [place]  [address]    [place], [place]  [address] [phone]  Fax  [phone]  [phone]  Fax [phone]   Problem List 1. CAD / CABG - 2000 2. HTN 3. Hyperlipidemia  History of Present Illness:  Brent [name] is 79 y.o. gentleman with a hx of CAD/CABG, hyperlipidemia, HTN.  He has not had any chest pain.  He has been having lots of left hip pain. He had a steroid injection which helped quite a bit but it appears that the injection has now worn off.  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  Feb. 24, 2014:  He has been having some dyspnea with exercise.  He has lots of orthopedic problems.  He is exercising some some but not as much as he would like.   Current Outpatient Prescriptions on File Prior to Visit  Medication Sig Dispense Refill  . amLODipine (NORVASC) 5 MG tablet Take 1 tablet (5 mg total) by mouth daily.  30 tablet  5  . aspirin EC 81 MG tablet Take 81 mg by mouth daily.      . diphenhydrAMINE (ALLERGY) 25 MG tablet Take 25 mg by mouth 2 (two) times daily.      . Glucosamine-Chondroit-Vit C-Mn (GLUCOSAMINE 1500 COMPLEX PO) Take 1 tablet by mouth 2 (two) times daily.      . meclizine (ANTIVERT) 25 MG tablet Take 25 mg by mouth every morning.      . Multiple Vitamins-Minerals (MENS 50+ ADVANCED PO) Take 1 tablet by mouth daily.      . nitroGLYCERIN (NITROSTAT) 0.4 MG SL tablet Place 0.4 mg under the tongue every 5 (five) minutes as needed. For chest pain      . Omega-3 Fatty Acids (FISH OIL PO) Take 1 capsule by mouth 2 (two) times daily.       Marland Kitchen omeprazole (PRILOSEC) 20 MG capsule Take 20 mg by mouth 2 (two) times daily.      . rosuvastatin (CRESTOR) 40 MG tablet Take 1 tablet (40 mg total) by mouth daily.  30 tablet  3  . telmisartan (MICARDIS) 80 MG tablet Take 1 tablet (80 mg total) by mouth daily.  30 tablet  5   . traMADol (ULTRAM) 50 MG tablet Take 50 mg by mouth every 6 (six) hours as needed. pain      . vitamin C (ASCORBIC ACID) 500 MG tablet Take 1,000 mg by mouth daily.       No current facility-administered medications on file prior to visit.    Allergies  Allergen Reactions  . Morphine And Related     Itch     Past Medical History  Diagnosis Date  . HTN (hypertension)   . CAD (coronary artery disease)     Status post CABG  . Hyperlipidemia   . Back pain   . Erectile dysfunction   . History of blood clots     to the lungs   . Pulmonary embolism     Past Surgical History  Procedure Laterality Date  . Coronary artery bypass graft  1998    there are sequential 90% stenosis in the proximal LAD, and the mid and distal LAD is a moderate sized vessel. The mid and first diagonal branch has a 50-60% stenosis.   . Cardiovascular stress test  04-28-2005    EF 53%  . Broken jaw      20+ years ago  . Broken ankle    . Esophagoscopy  06/25/2011    Procedure: ESOPHAGOSCOPY;  Surgeon: Serena Colonel, MD;  Location: [facility] OR;  Service: ENT;  Laterality: N/A;  Direct Esophagoscopy    History  Smoking status  . Former Smoker  Smokeless tobacco  . Not on file    History  Alcohol Use  . 0.6 oz/week  . 1 Glasses of wine per week    No family history on file.  Reviw of Systems:  Reviewed in the HPI.  All other systems are negative.  Physical Exam: BP 142/84  Pulse 61  Ht 6\' 2"  (1.88 m)  Wt 256 lb 6.4 oz (116.302 kg)  BMI 32.91 kg/m2 The patient is alert and oriented x 3.  The mood and affect are normal.   Skin: warm and dry.  Color is normal.    HEENT:   Normocephalic/atraumatic. His normal carotids. There is no JVD. His neck is supple.  Lungs: fine rales in the bases bilaterally   Heart: Regular rate S1-S2    Abdomen: Good bowel sounds. No hepatosplenomegaly  Extremities:  No clubbing cyanosis or edema.  Neuro:  Neuro exam is nonfocal.    ECG: Feb. 24, 2014:  Sinus  bradycardia at 53.  Marland Kitchen He has a first degree AV block. There is an old inferior wall myocardial infarction.    Assessment / Plan:

## 2012-04-19 NOTE — Assessment & Plan Note (Signed)
Brent [name] presents today for followup visit. He has been having some shortness breath particularly with exertion. He also has bilateral rales on exam.  His original presenting symptoms approximately 15 years ago included shortness breath. He never had any significant episodes of chest pain. I worry that this may be an angina equivalent.  Will get an echocardiogram for further evaluation of his left ventricular function. We will also schedule him for a stress Myoview study to rule out ischemia. He has had an inferior wall myocardial infarction so we may expect an inferior scar on Myoview study.  Will start him on Lasix 40 mg and potassium chloride 10 mEq on Mondays, Wednesdays, and Fridays. I'll see him back in 3 months for followup visit. We'll check a basic metabolic profile on him during that time.

## 2012-04-19 NOTE — Patient Instructions (Addendum)
Your physician has requested that you have en exercise stress myoview. For further information please visit [email or web address] Please follow instruction sheet, as given.  Your physician has requested that you have an echocardiogram. Echocardiography is a painless test that uses sound waves to create images of your heart. It provides your doctor with information about the size and shape of your heart and how well your heart's chambers and valves are working. This procedure takes approximately one hour. There are no restrictions for this procedure.  Your physician recommends that you schedule a follow-up appointment in: 3 MONTH WITH BMP   Your physician has recommended you make the following change in your medication:   START LASIX 40 MG ON Monday, Wednesday, Friday MORNING START POTASSIUM (K-DUR) 10 MEQ ON Monday, Wednesday, Friday MORNING

## 2012-04-27 ENCOUNTER — Other Ambulatory Visit (HOSPITAL_COMMUNITY): Payer: Medicare Other

## 2012-04-29 ENCOUNTER — Ambulatory Visit (HOSPITAL_COMMUNITY): Payer: Medicare Other | Attending: Cardiology | Admitting: Radiology

## 2012-04-29 ENCOUNTER — Other Ambulatory Visit: Payer: Self-pay | Admitting: *Deleted

## 2012-04-29 VITALS — Ht 74.0 in | Wt 252.0 lb

## 2012-04-29 DIAGNOSIS — R0989 Other specified symptoms and signs involving the circulatory and respiratory systems: Secondary | ICD-10-CM | POA: Insufficient documentation

## 2012-04-29 DIAGNOSIS — R0600 Dyspnea, unspecified: Secondary | ICD-10-CM

## 2012-04-29 DIAGNOSIS — R0602 Shortness of breath: Secondary | ICD-10-CM

## 2012-04-29 DIAGNOSIS — Z951 Presence of aortocoronary bypass graft: Secondary | ICD-10-CM

## 2012-04-29 DIAGNOSIS — I2510 Atherosclerotic heart disease of native coronary artery without angina pectoris: Secondary | ICD-10-CM | POA: Insufficient documentation

## 2012-04-29 DIAGNOSIS — R0609 Other forms of dyspnea: Secondary | ICD-10-CM | POA: Insufficient documentation

## 2012-04-29 MED ORDER — TECHNETIUM TC 99M SESTAMIBI GENERIC - CARDIOLITE
11.0000 | Freq: Once | INTRAVENOUS | Status: AC | PRN
  Administered 2012-04-29: 11 via INTRAVENOUS

## 2012-04-29 MED ORDER — TELMISARTAN 80 MG PO TABS: 80.0000 mg | ORAL_TABLET | Freq: Every day | ORAL | Status: DC

## 2012-04-29 MED ORDER — TECHNETIUM TC 99M SESTAMIBI GENERIC - CARDIOLITE
33.0000 | Freq: Once | INTRAVENOUS | Status: AC | PRN
  Administered 2012-04-29: 33 via INTRAVENOUS

## 2012-04-29 NOTE — Telephone Encounter (Signed)
Fax Received. Refill Completed. Marvie Brevik Chowoe (R.M.A)   

## 2012-04-29 NOTE — Progress Notes (Signed)
[facility] SITE 3 NUCLEAR MED [address] [place], [place] [address] [phone]    Cardiology Nuclear Med Study  Brent [name] is a 79 y.o. male     MRN : [id number]     DOB: 03-19-1931  Procedure Date: 04/29/2012  Nuclear Med Background Indication for Stress Test:  Evaluation for Ischemia and Graft Patency History:  '98 Heart Catheterization> CABG, 03-07 Myocardial Perfusion Study-No ischemia, inferior scar EF=53% Cardiac Risk Factors: Family History - CAD, History of Smoking, Hypertension and Lipids  Symptoms:  DOE, Fatigue, Fatigue with Exertion and SOB   Nuclear Pre-Procedure Caffeine/Decaff Intake:  None NPO After: 7:30pm   Lungs:  clear O2 Sat: 95% on room air. IV 0.9% NS with Angio Cath:  22g  IV Site: R Hand  IV Started by:  Bonnita Levan, RN  Chest Size (in):  48 Cup Size: n/a  Height: 6\' 2"  (1.88 m)  Weight:  252 lb (114.306 kg)  BMI:  Body mass index is 32.34 kg/(m^2). Tech Comments:  N/A    Nuclear Med Study 1 or 2 day study: 1 day  Stress Test Type:  Stress  Reading MD: Cassell Clement, MD  Order Authorizing Provider:  Kristeen Miss, MD  Resting Radionuclide: Technetium 104m Sestamibi  Resting Radionuclide Dose: 11.0 mCi   Stress Radionuclide:  Technetium 1m Sestamibi  Stress Radionuclide Dose: 33.0 mCi           Stress Protocol Rest HR: 58 Stress HR: 134  Rest BP: 142/79 Stress BP: 210/65  Exercise Time (min): 5:16 METS: 7.0   Predicted Max HR: 142 bpm % Max HR: 94.37 bpm Rate Pressure Product: 21308   Dose of Adenosine (mg):  n/a Dose of Lexiscan: n/a mg  Dose of Atropine (mg): n/a Dose of Dobutamine: n/a mcg/kg/min (at max HR)  Stress Test Technologist: Irean Hong, RN  Nuclear Technologist:  Domenic Polite, CNMT     Rest Procedure:  Myocardial perfusion imaging was performed at rest 45 minutes following the intravenous administration of Technetium 77m Sestamibi. Rest ECG: NSR with non-specific ST-T wave changes  Stress  Procedure:  The patient exercised on the treadmill utilizing the Bruce Protocol for 5:16 minutes, RPE=15. The patient stopped due to DOE,(L) hip pain10/10, and denied any chest pain. The patient had a hypertensive response to exercise. Technetium 73m Sestamibi was injected at peak exercise and myocardial perfusion imaging was performed after a brief delay. Stress ECG: No significant change from baseline ECG  QPS Raw Data Images:  Normal; no motion artifact; normal heart/lung ratio. Stress Images:  Normal homogeneous uptake in all areas of the myocardium. Normal apical thinning. Rest Images:  Normal homogeneous uptake in all areas of the myocardium. Subtraction (SDS):  No evidence of ischemia. Transient Ischemic Dilatation (Normal <1.22):  1.00 Lung/Heart Ratio (Normal <0.45):  0.47  Quantitative Gated Spect Images QGS EDV:  145 ml QGS ESV:  62 ml  Impression Exercise Capacity:  Good exercise capacity. BP Response:  Normal blood pressure response. Clinical Symptoms:  No chest pain. ECG Impression:  No significant ST segment change suggestive of ischemia. Comparison with Prior Nuclear Study: No images to compare  Overall Impression:  Normal stress nuclear study.  LV Ejection Fraction: 57%.  LV Wall Motion:  NL LV Function; NL Wall Motion  [employer]

## 2012-05-05 ENCOUNTER — Ambulatory Visit (HOSPITAL_COMMUNITY): Payer: Medicare Other | Attending: Cardiovascular Disease | Admitting: Radiology

## 2012-05-05 DIAGNOSIS — I079 Rheumatic tricuspid valve disease, unspecified: Secondary | ICD-10-CM | POA: Insufficient documentation

## 2012-05-05 DIAGNOSIS — E669 Obesity, unspecified: Secondary | ICD-10-CM | POA: Insufficient documentation

## 2012-05-05 DIAGNOSIS — R0989 Other specified symptoms and signs involving the circulatory and respiratory systems: Secondary | ICD-10-CM | POA: Insufficient documentation

## 2012-05-05 DIAGNOSIS — R0600 Dyspnea, unspecified: Secondary | ICD-10-CM

## 2012-05-05 DIAGNOSIS — R0609 Other forms of dyspnea: Secondary | ICD-10-CM | POA: Insufficient documentation

## 2012-05-05 DIAGNOSIS — E785 Hyperlipidemia, unspecified: Secondary | ICD-10-CM | POA: Insufficient documentation

## 2012-05-05 DIAGNOSIS — I2510 Atherosclerotic heart disease of native coronary artery without angina pectoris: Secondary | ICD-10-CM

## 2012-05-05 DIAGNOSIS — Z951 Presence of aortocoronary bypass graft: Secondary | ICD-10-CM

## 2012-05-05 NOTE — Progress Notes (Signed)
Echocardiogram performed.  

## 2012-05-07 ENCOUNTER — Telehealth: Payer: Self-pay | Admitting: Cardiovascular Disease

## 2012-05-07 NOTE — Telephone Encounter (Signed)
done

## 2012-05-07 NOTE — Telephone Encounter (Signed)
New Problem:     Patient called in wanting to know the results of his latest ECHO/ Stress Test.  Please call back.

## 2012-05-31 MED ORDER — ROSUVASTATIN CALCIUM 40 MG PO TABS: 40.0000 mg | ORAL_TABLET | Freq: Every day | ORAL | Status: DC

## 2012-05-31 NOTE — Telephone Encounter (Signed)
msg left that the refill was sent to pharmacy and can contact them for pick up, told to call with concerns,

## 2012-05-31 NOTE — Telephone Encounter (Signed)
New problem    Pt is going out of town and will be gone for several days pt only has 2 Crestor pills left with no refills pt want to know what is the fastest option  if they can pick up 1 pack of Crestor for the week or if a prescription was called in can they pick it up today by 5pm. Please call pt concerning this matter.

## 2012-07-16 ENCOUNTER — Other Ambulatory Visit: Payer: Medicare Other

## 2012-07-20 ENCOUNTER — Ambulatory Visit: Payer: Medicare Other | Admitting: Cardiovascular Disease

## 2012-08-18 ENCOUNTER — Other Ambulatory Visit: Payer: Self-pay | Admitting: Otolaryngology

## 2012-08-18 DIAGNOSIS — R1310 Dysphagia, unspecified: Secondary | ICD-10-CM

## 2012-08-25 ENCOUNTER — Ambulatory Visit
Admission: RE | Admit: 2012-08-25 | Discharge: 2012-08-25 | Disposition: A | Discharge status: Home / Self Care | Payer: Medicare Other | Source: Ambulatory Visit | Attending: Otolaryngology | Admitting: Otolaryngology

## 2012-08-25 DIAGNOSIS — R1310 Dysphagia, unspecified: Secondary | ICD-10-CM

## 2012-09-20 ENCOUNTER — Other Ambulatory Visit: Payer: Self-pay | Admitting: *Deleted

## 2012-09-20 MED ORDER — AMLODIPINE BESYLATE 5 MG PO TABS: 5.0000 mg | ORAL_TABLET | Freq: Every day | ORAL | Status: DC

## 2012-09-24 ENCOUNTER — Other Ambulatory Visit: Payer: Self-pay | Admitting: *Deleted

## 2012-09-24 MED ORDER — TELMISARTAN 80 MG PO TABS: 80.0000 mg | ORAL_TABLET | Freq: Every day | ORAL | Status: DC

## 2012-10-29 ENCOUNTER — Ambulatory Visit: Payer: Medicare Other | Admitting: Cardiovascular Disease

## 2012-11-08 ENCOUNTER — Telehealth: Payer: Self-pay | Admitting: Cardiovascular Disease

## 2012-11-08 NOTE — Telephone Encounter (Signed)
New problem   Pt is having a Endoscopy procedure  and need to know if he can be off of Ecotrin for 10 days and he need to know by tomorrow.

## 2012-11-08 NOTE — Telephone Encounter (Signed)
Pt was told he can be off asa for 5-7 days, asked him to have his Surgeon send  Request if anything further is needed. He agreed to plan.

## 2012-11-09 NOTE — Telephone Encounter (Signed)
New problem   Brent [name]/Dr Kristine Garbe  Pt will be having sx on 11/16/12 and they need cardiac clearance and how long can pt stop Asprin. Please fax to [phone]

## 2012-11-09 NOTE — Telephone Encounter (Signed)
LM for Brent [name] [phone] pt was told he could hold ASA 5-7 days prior to procedure, she can fax request for cardiac clearance form to Dr Elease Hashimoto [phone] if more information needed.

## 2012-11-16 ENCOUNTER — Telehealth: Payer: Self-pay | Admitting: *Deleted

## 2012-11-16 NOTE — Telephone Encounter (Signed)
Received request from [facility] // Dr Kristine Garbe for cardiac clearance, left a msg on their triage line requesting to know what procedure he is having and if done under general. Requested a call back.

## 2012-11-26 ENCOUNTER — Other Ambulatory Visit: Payer: Self-pay

## 2012-11-26 MED ORDER — AMLODIPINE BESYLATE 5 MG PO TABS: 5.0000 mg | ORAL_TABLET | Freq: Every day | ORAL | Status: DC

## 2012-12-30 ENCOUNTER — Ambulatory Visit (INDEPENDENT_AMBULATORY_CARE_PROVIDER_SITE_OTHER): Payer: Medicare Other | Admitting: Cardiovascular Disease

## 2012-12-30 ENCOUNTER — Encounter: Payer: Self-pay | Admitting: Cardiovascular Disease

## 2012-12-30 VITALS — BP 110/70 | HR 62 | Ht 74.0 in | Wt 243.0 lb

## 2012-12-30 DIAGNOSIS — I2510 Atherosclerotic heart disease of native coronary artery without angina pectoris: Secondary | ICD-10-CM

## 2012-12-30 DIAGNOSIS — E785 Hyperlipidemia, unspecified: Secondary | ICD-10-CM

## 2012-12-30 NOTE — Assessment & Plan Note (Signed)
His blood pressure stable. Continue same medications.

## 2012-12-30 NOTE — Progress Notes (Signed)
Brent [name] Date of Birth  1931/06/20 [facility]     [place] Office  [address]    [address]   [address] [place], [place]  [address]    [place], [place]  [address] [phone]  Fax  [phone]  [phone]  Fax [phone]   Problem List 1. CAD / CABG - 2000 2. HTN 3. Hyperlipidemia  History of Present Illness:  Brent [name] is 79 y.o. gentleman with a hx of CAD/CABG, hyperlipidemia, HTN.  He has not had any chest pain.  He has been having lots of left hip pain. He had a steroid injection which helped quite a bit but it appears that the injection has now worn off.  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  Feb. 24, 2014:  He has been having some dyspnea with exercise.  He has lots of orthopedic problems.  He is exercising some some but not as much as he would like.  Nov. 6 ,2014:  Brent [name] is doing well.  He recently had a laser treatment for an esophageal issue.  No cardiac issues.    Feeling well.   Still working on a 1942 car.   He has lost 10 lbs.     Current Outpatient Prescriptions on File Prior to Visit  Medication Sig Dispense Refill  . amLODipine (NORVASC) 5 MG tablet Take 1 tablet (5 mg total) by mouth daily.  30 tablet  1  . aspirin EC 81 MG tablet Take 81 mg by mouth daily.      . diphenhydrAMINE (ALLERGY) 25 MG tablet Take 25 mg by mouth 2 (two) times daily.      . furosemide (LASIX) 40 MG tablet 1 TABLET ON Monday, Wednesday, FRIDAY  15 tablet  6  . Glucosamine-Chondroit-Vit C-Mn (GLUCOSAMINE 1500 COMPLEX PO) Take 1 tablet by mouth 2 (two) times daily.      . meclizine (ANTIVERT) 25 MG tablet Take 25 mg by mouth every morning.      . Multiple Vitamins-Minerals (MENS 50+ ADVANCED PO) Take 1 tablet by mouth daily.      . nitroGLYCERIN (NITROSTAT) 0.4 MG SL tablet Place 0.4 mg under the tongue every 5 (five) minutes as needed. For chest pain      . Omega-3 Fatty Acids (FISH OIL PO) Take 1 capsule by mouth 2 (two) times daily.        Marland Kitchen omeprazole (PRILOSEC) 20 MG capsule Take 20 mg by mouth 2 (two) times daily.      . potassium chloride (K-DUR) 10 MEQ tablet 1 TABLET Monday, Wednesday, FRIDAY  15 tablet  6  . rosuvastatin (CRESTOR) 40 MG tablet Take 1 tablet (40 mg total) by mouth daily.  30 tablet  11  . telmisartan (MICARDIS) 80 MG tablet Take 1 tablet (80 mg total) by mouth daily.  30 tablet  5  . traMADol (ULTRAM) 50 MG tablet Take 50 mg by mouth every 6 (six) hours as needed. pain      . vitamin C (ASCORBIC ACID) 500 MG tablet Take 1,000 mg by mouth daily.       No current facility-administered medications on file prior to visit.    Allergies  Allergen Reactions  . Morphine And Related     Itch     Past Medical History  Diagnosis Date  . HTN (hypertension)   . CAD (coronary artery disease)     Status post CABG  . Hyperlipidemia   . Back pain   .  Erectile dysfunction   . History of blood clots     to the lungs   . Pulmonary embolism     Past Surgical History  Procedure Laterality Date  . Coronary artery bypass graft  1998    there are sequential 90% stenosis in the proximal LAD, and the mid and distal LAD is a moderate sized vessel. The mid and first diagonal branch has a 50-60% stenosis.   . Cardiovascular stress test  04-28-2005    EF 53%  . Broken jaw      20+ years ago  . Broken ankle    . Esophagoscopy  06/25/2011    Procedure: ESOPHAGOSCOPY;  Surgeon: Serena Colonel, MD;  Location: [facility] OR;  Service: ENT;  Laterality: N/A;  Direct Esophagoscopy    History  Smoking status  . Former Smoker  Smokeless tobacco  . Not on file    History  Alcohol Use  . 0.6 oz/week  . 1 Glasses of wine per week    No family history on file.  Reviw of Systems:  Reviewed in the HPI.  All other systems are negative.  Physical Exam: BP 110/70  Pulse 62  Ht 6\' 2"  (1.88 m)  Wt 243 lb (110.224 kg)  BMI 31.19 kg/m2 The patient is alert and oriented x 3.  The mood and affect are normal.   Skin: warm and dry.   Color is normal.    HEENT:   Normocephalic/atraumatic. His normal carotids. There is no JVD. His neck is supple.  Lungs: fine rales in the bases bilaterally   Heart: Regular rate S1-S2    Abdomen: Good bowel sounds. No hepatosplenomegaly  Extremities:  No clubbing cyanosis or edema.  Neuro:  Neuro exam is nonfocal.    ECG: Feb. 24, 2014:  Sinus bradycardia at 53.  Marland Kitchen He has a first degree AV block. There is an old inferior wall myocardial infarction.    Assessment / Plan:

## 2012-12-30 NOTE — Patient Instructions (Signed)
Your physician wants you to follow-up in: 6 months with EKG  You will receive a reminder letter in the mail two months in advance. If you don't receive a letter, please call our office to schedule the follow-up appointment.   Your physician recommends that you return for a FASTING lipid profile: today

## 2012-12-30 NOTE — Assessment & Plan Note (Signed)
Brent [name] is doing well. He's not having episodes of chest pain. He has been walking more recently. He's lost 10 pounds. We'll check his fasting lipids / liver/ bmp today. I'll see him again in 6 months for followup office visit, EKG, and fasting labs.

## 2012-12-31 LAB — LIPID PANEL
HDL: 38.3 mg/dL — ABNORMAL LOW (ref >39.00)
Total CHOL/HDL Ratio: 4

## 2012-12-31 LAB — BASIC METABOLIC PANEL
BUN: 20 mg/dL (ref 6–23)
CO2: 26 mEq/L (ref 19–32)
Calcium: 9 mg/dL (ref 8.4–10.5)
Creatinine, Ser: 1.3 mg/dL (ref 0.4–1.5)
GFR: 57.6 mL/min — ABNORMAL LOW (ref >60.00)
Glucose, Bld: 99 mg/dL (ref 70–99)

## 2012-12-31 LAB — HEPATIC FUNCTION PANEL
AST: 27 U/L (ref 0–37)
Alkaline Phosphatase: 80 U/L (ref 39–117)
Bilirubin, Direct: 0.1 mg/dL (ref 0.0–0.3)
Total Bilirubin: 0.5 mg/dL (ref 0.3–1.2)

## 2013-01-28 ENCOUNTER — Other Ambulatory Visit: Payer: Self-pay

## 2013-01-28 DIAGNOSIS — I2510 Atherosclerotic heart disease of native coronary artery without angina pectoris: Secondary | ICD-10-CM

## 2013-01-28 DIAGNOSIS — Z951 Presence of aortocoronary bypass graft: Secondary | ICD-10-CM

## 2013-01-28 DIAGNOSIS — R0600 Dyspnea, unspecified: Secondary | ICD-10-CM

## 2013-01-28 MED ORDER — FUROSEMIDE 40 MG PO TABS: ORAL_TABLET | ORAL | Status: DC

## 2013-01-28 MED ORDER — POTASSIUM CHLORIDE ER 10 MEQ PO TBCR: EXTENDED_RELEASE_TABLET | ORAL | Status: DC

## 2013-01-28 MED ORDER — AMLODIPINE BESYLATE 5 MG PO TABS: 5.0000 mg | ORAL_TABLET | Freq: Every day | ORAL | Status: DC

## 2013-04-29 ENCOUNTER — Other Ambulatory Visit: Payer: Self-pay

## 2013-04-29 MED ORDER — TELMISARTAN 80 MG PO TABS: 80.0000 mg | ORAL_TABLET | Freq: Every day | ORAL | Status: DC

## 2013-05-17 ENCOUNTER — Telehealth: Payer: Self-pay | Admitting: Cardiovascular Disease

## 2013-05-17 NOTE — Telephone Encounter (Signed)
New message     FYI Dr Onnie Graham will be faxing to [place] a clearance for shoulder replacement.  Surgery is pending Dr [name] clearance

## 2013-05-17 NOTE — Telephone Encounter (Signed)
Noted/// will watch for clearance request.

## 2013-05-18 ENCOUNTER — Telehealth: Payer: Self-pay | Admitting: Cardiovascular Disease

## 2013-05-18 NOTE — Telephone Encounter (Signed)
ARRIVED/ TAKEN TO MR TO BE FAXED.

## 2013-05-18 NOTE — Telephone Encounter (Signed)
Received request from Nurse fax box, documents faxed for surgical clearance. To: [employer] Fax number: [phone] Attention: [phone]/[phone]

## 2013-05-20 ENCOUNTER — Encounter (HOSPITAL_COMMUNITY): Payer: Self-pay | Admitting: Pharmacy Technician

## 2013-05-25 NOTE — Pre-Procedure Instructions (Signed)
Brent [name]  05/25/2013   Your procedure is scheduled on:  Thurs, April 9 @ 7:30 AM  Report to [facility]  at 5:30 AM.  Call this number if you have problems the morning of surgery: [phone]   Remember:   Do not eat food or drink liquids after midnight.   Take these medicines the morning of surgery with A SIP OF WATER: Amlodipine(Norvasc),Antivert(Meclizine),Omeprazole(Prilosec),and Tramadol(Ultram-if needed)               Stop taking your Aspirin and Fish Oil. No Goody's,BC's,Aleve,Ibuprofen,or any Herbal Medications   Do not wear jewelry  Do not wear lotions, powders, or colognes You may wear deodorant.  Men may shave face and neck.  Do not bring valuables to the hospital.  [facility] is not responsible                  for any belongings or valuables.               Contacts, dentures or bridgework may not be worn into surgery.  Leave suitcase in the car. After surgery it may be brought to your room.  For patients admitted to the hospital, discharge time is determined by your                treatment team.                Special Instructions:  [place] - Preparing for Surgery  Before surgery, you can play an important role.  Because skin is not sterile, your skin needs to be as free of germs as possible.  You can reduce the number of germs on you skin by washing with CHG (chlorahexidine gluconate) soap before surgery.  CHG is an antiseptic cleaner which kills germs and bonds with the skin to continue killing germs even after washing.  Please DO NOT use if you have an allergy to CHG or antibacterial soaps.  If your skin becomes reddened/irritated stop using the CHG and inform your nurse when you arrive at Short Stay.  Do not shave (including legs and underarms) for at least 48 hours prior to the first CHG shower.  You may shave your face.  Please follow these instructions carefully:   1.  Shower with CHG Soap the night before surgery and the                                 morning of Surgery.  2.  If you choose to wash your hair, wash your hair first as usual with your       normal shampoo.  3.  After you shampoo, rinse your hair and body thoroughly to remove the                      Shampoo.  4.  Use CHG as you would any other liquid soap.  You can apply chg directly       to the skin and wash gently with scrungie or a clean washcloth.  5.  Apply the CHG Soap to your body ONLY FROM THE NECK DOWN.        Do not use on open wounds or open sores.  Avoid contact with your eyes,       ears, mouth and genitals (private parts).  Wash genitals (private parts)       with your normal soap.  6.  Wash thoroughly, paying special attention to the area where your surgery        will be performed.  7.  Thoroughly rinse your body with warm water from the neck down.  8.  DO NOT shower/wash with your normal soap after using and rinsing off       the CHG Soap.  9.  Pat yourself dry with a clean towel.            10.  Wear clean pajamas.            11.  Place clean sheets on your bed the night of your first shower and do not        sleep with pets.  Day of Surgery  Do not apply any lotions/deoderants the morning of surgery.  Please wear clean clothes to the hospital/surgery center.     Please read over the following fact sheets that you were given: Pain Booklet, Coughing and Deep Breathing, Blood Transfusion Information and Surgical Site Infection Prevention

## 2013-05-26 ENCOUNTER — Encounter (HOSPITAL_COMMUNITY): Payer: Self-pay

## 2013-05-26 ENCOUNTER — Inpatient Hospital Stay (HOSPITAL_COMMUNITY)
Admission: RE | Admit: 2013-05-26 | Discharge: 2013-05-26 | Disposition: A | Discharge status: Home / Self Care | Payer: Medicare Other | Source: Ambulatory Visit

## 2013-05-26 HISTORY — DX: Dizziness and giddiness: R42

## 2013-05-26 HISTORY — DX: Gastro-esophageal reflux disease without esophagitis: K21.9

## 2013-05-26 NOTE — Progress Notes (Addendum)
Cardiologist is Dr.Nahser-clearance in hard chart    Echo in epic from 2014  Stress test in epic from 2007 and 2014

## 2013-05-27 ENCOUNTER — Other Ambulatory Visit: Payer: Self-pay

## 2013-05-27 MED ORDER — ROSUVASTATIN CALCIUM 40 MG PO TABS: 40.0000 mg | ORAL_TABLET | Freq: Every day | ORAL | Status: DC

## 2013-05-31 ENCOUNTER — Encounter (HOSPITAL_COMMUNITY)
Admission: RE | Admit: 2013-05-31 | Discharge: 2013-05-31 | Disposition: A | Discharge status: Home / Self Care | Payer: Medicare HMO | Source: Ambulatory Visit | Attending: Orthopedic Surgery | Admitting: Orthopedic Surgery

## 2013-05-31 ENCOUNTER — Encounter (HOSPITAL_COMMUNITY)
Admission: RE | Admit: 2013-05-31 | Discharge: 2013-05-31 | Disposition: A | Discharge status: Home / Self Care | Payer: Medicare HMO | Source: Ambulatory Visit | Attending: Anesthesiology | Admitting: Anesthesiology

## 2013-05-31 ENCOUNTER — Encounter (HOSPITAL_COMMUNITY): Payer: Self-pay

## 2013-05-31 HISTORY — DX: Unspecified osteoarthritis, unspecified site: M19.90

## 2013-05-31 HISTORY — DX: Acute myocardial infarction, unspecified: I21.9

## 2013-05-31 LAB — COMPREHENSIVE METABOLIC PANEL
ALT: 12 U/L (ref 0–53)
AST: 19 U/L (ref 0–37)
Albumin: 3.6 g/dL (ref 3.5–5.2)
Alkaline Phosphatase: 88 U/L (ref 39–117)
BUN: 24 mg/dL — AB (ref 6–23)
CALCIUM: 9.2 mg/dL (ref 8.4–10.5)
CO2: 27 mEq/L (ref 19–32)
Chloride: 103 mEq/L (ref 96–112)
Creatinine, Ser: 1.31 mg/dL (ref 0.50–1.35)
GFR calc non Af Amer: 50 mL/min — ABNORMAL LOW (ref >90)
GFR, EST AFRICAN AMERICAN: 58 mL/min — AB (ref >90)
GLUCOSE: 106 mg/dL — AB (ref 70–99)
Potassium: 4.6 mEq/L (ref 3.7–5.3)
Sodium: 144 mEq/L (ref 137–147)
TOTAL PROTEIN: 7.4 g/dL (ref 6.0–8.3)
Total Bilirubin: 0.4 mg/dL (ref 0.3–1.2)

## 2013-05-31 LAB — CBC WITH DIFFERENTIAL/PLATELET
Basophils Absolute: 0 10*3/uL (ref 0.0–0.1)
Basophils Relative: 1 % (ref 0–1)
EOS ABS: 0.3 10*3/uL (ref 0.0–0.7)
EOS PCT: 5 % (ref 0–5)
HCT: 40.4 % (ref 39.0–52.0)
HEMOGLOBIN: 13.2 g/dL (ref 13.0–17.0)
LYMPHS ABS: 2.2 10*3/uL (ref 0.7–4.0)
Lymphocytes Relative: 34 % (ref 12–46)
MCH: 30.4 pg (ref 26.0–34.0)
MCHC: 32.7 g/dL (ref 30.0–36.0)
MCV: 93.1 fL (ref 78.0–100.0)
MONOS PCT: 7 % (ref 3–12)
Monocytes Absolute: 0.5 10*3/uL (ref 0.1–1.0)
Neutro Abs: 3.6 10*3/uL (ref 1.7–7.7)
Neutrophils Relative %: 53 % (ref 43–77)
Platelets: 242 10*3/uL (ref 150–400)
RBC: 4.34 MIL/uL (ref 4.22–5.81)
RDW: 13.5 % (ref 11.5–15.5)
WBC: 6.6 10*3/uL (ref 4.0–10.5)

## 2013-05-31 LAB — PROTIME-INR
INR: 1.06 (ref 0.00–1.49)
Prothrombin Time: 13.6 seconds (ref 11.6–15.2)

## 2013-05-31 LAB — APTT: aPTT: 25 seconds (ref 24–37)

## 2013-05-31 LAB — ABO/RH: ABO/RH(D): A NEG

## 2013-05-31 NOTE — Progress Notes (Signed)
Anesthesia Chart Review:  Patient is a 80 year old males scheduled for right total shoulder on 06/02/13 by Dr. Onnie Graham.  History includes former smoker, PE (date unknown, but prior to 05/2011), HTN, GERD, CAD/MI s/p CABG '98, arthritis, tonsillectomy, ED, jaw fracture > 20 years ago, post-operative N/V. BMI is 33.3 consistent with obesity. PCP is listed as [name]. Cardiologist is Dr. Acie Fredrickson who cleared him with low for this procedure. He did recommend that patient continue ASA.   Echo on 05/05/12 showed: - Left ventricle: The cavity size was mildly dilated. Wall thickness was increased in a pattern of mild LVH. Systolic function was mildly to moderately reduced. The estimated ejection fraction was in the range of 40% to 45%. - Left atrium: The atrium was moderately dilated. - Right ventricle: Systolic function was mildly reduced. - Right atrium: The atrium was mildly to moderately dilated. - Pulmonary arteries: PA peak pressure: 46mm Hg (S). - Tricuspid valve: Trivial regurgitation.  Nuclear stress test on 04/29/12 showed: Overall Impression: Normal stress nuclear study. LV Ejection Fraction: 57%. LV Wall Motion: NL LV Function; NL Wall Motion.  EKG on 05/31/13 showed SR with first degree AVB, inferior infarct (age undetermined), cannot rule out anterior infarct (age undetermined).   CXR on 05/31/13 showed: Cardiac shadow is at the upper limits of normal in size. Postsurgical changes are again seen. Mild interstitial changes are noted throughout both lungs without focal confluent infiltrate or sizable effusion. No acute bony abnormality is seen. IMPRESSION: Chronic changes without acute abnormality.  Preoperative labs noted.  If no acute changes then I anticipate that he can proceed as planned.  [facility] Short Stay Center/Anesthesiology Phone [phone] 05/31/2013 4:13 PM

## 2013-05-31 NOTE — Pre-Procedure Instructions (Addendum)
Brent [name]  05/31/2013   Your procedure is scheduled on:  Thurs, April 9 @ 7:30 AM  Report to [facility]  at 5:30 AM.  Call this number if you have problems the morning of surgery: [phone]   Remember:   Do not eat food or drink liquids after midnight.   Take these medicines the morning of surgery with A SIP OF WATER: Amlodipine(Norvasc),Antivert(Meclizine if needed),Omeprazole(Prilosec),and Tramadol ,allergy med, nitro if needed                Stop taking your   Fish Oil. No Goody's,BC's,Aleve,Ibuprofen,or any Herbal Medications,glucosamine,vitamins   Do not wear jewelry  Do not wear lotions, powders, or colognes You may wear deodorant.  Men may shave face and neck.  Do not bring valuables to the hospital.  [facility] is not responsible                  for any belongings or valuables.               Contacts, dentures or bridgework may not be worn into surgery.  Leave suitcase in the car. After surgery it may be brought to your room.  For patients admitted to the hospital, discharge time is determined by your                treatment team.                Special Instructions:  [place] - Preparing for Surgery  Before surgery, you can play an important role.  Because skin is not sterile, your skin needs to be as free of germs as possible.  You can reduce the number of germs on you skin by washing with CHG (chlorahexidine gluconate) soap before surgery.  CHG is an antiseptic cleaner which kills germs and bonds with the skin to continue killing germs even after washing.  Please DO NOT use if you have an allergy to CHG or antibacterial soaps.  If your skin becomes reddened/irritated stop using the CHG and inform your nurse when you arrive at Short Stay.  Do not shave (including legs and underarms) for at least 48 hours prior to the first CHG shower.  You may shave your face.  Please follow these instructions carefully:   1.  Shower with CHG Soap the night before  surgery and the                                morning of Surgery.  2.  If you choose to wash your hair, wash your hair first as usual with your       normal shampoo.  3.  After you shampoo, rinse your hair and body thoroughly to remove the                      Shampoo.  4.  Use CHG as you would any other liquid soap.  You can apply chg directly       to the skin and wash gently with scrungie or a clean washcloth.  5.  Apply the CHG Soap to your body ONLY FROM THE NECK DOWN.        Do not use on open wounds or open sores.  Avoid contact with your eyes,       ears, mouth and genitals (private parts).  Wash genitals (private parts)  with your normal soap.  6.  Wash thoroughly, paying special attention to the area where your surgery        will be performed.  7.  Thoroughly rinse your body with warm water from the neck down.  8.  DO NOT shower/wash with your normal soap after using and rinsing off       the CHG Soap.  9.  Pat yourself dry with a clean towel.            10.  Wear clean pajamas.            11.  Place clean sheets on your bed the night of your first shower and do not        sleep with pets.  Day of Surgery  Do not apply any lotions/deoderants the morning of surgery.  Please wear clean clothes to the hospital/surgery center.     Please read over the following fact sheets that you were given: Pain Booklet, Coughing and Deep Breathing, Blood Transfusion Information and Surgical Site Infection Prevention

## 2013-06-01 LAB — TYPE AND SCREEN
ABO/RH(D): A NEG
ANTIBODY SCREEN: NEGATIVE

## 2013-06-01 MED ORDER — CEFAZOLIN SODIUM-DEXTROSE 2-3 GM-% IV SOLR
2.0000 g | INTRAVENOUS | Status: AC
  Administered 2013-06-02: 2 g via INTRAVENOUS
  Filled 2013-06-01: qty 50

## 2013-06-01 MED ORDER — CHLORHEXIDINE GLUCONATE 4 % EX LIQD
60.0000 mL | Freq: Once | CUTANEOUS | Status: DC
  Filled 2013-06-01: qty 60

## 2013-06-02 ENCOUNTER — Inpatient Hospital Stay (HOSPITAL_COMMUNITY): Payer: Medicare HMO | Admitting: Certified Registered Nurse Anesthetist

## 2013-06-02 ENCOUNTER — Inpatient Hospital Stay (HOSPITAL_COMMUNITY): Payer: Medicare HMO

## 2013-06-02 ENCOUNTER — Encounter (HOSPITAL_COMMUNITY): Payer: Medicare HMO | Admitting: Vascular Surgery

## 2013-06-02 ENCOUNTER — Encounter (HOSPITAL_COMMUNITY)
Admission: RE | Disposition: A | Discharge status: Home / Self Care | Payer: Self-pay | Source: Ambulatory Visit | Attending: Orthopedic Surgery

## 2013-06-02 ENCOUNTER — Inpatient Hospital Stay (HOSPITAL_COMMUNITY)
Admission: RE | Admit: 2013-06-02 | Discharge: 2013-06-03 | DRG: 483 | Disposition: A | Discharge status: Home / Self Care | Payer: Medicare HMO | Source: Ambulatory Visit | Attending: Orthopedic Surgery | Admitting: Orthopedic Surgery

## 2013-06-02 ENCOUNTER — Encounter (HOSPITAL_COMMUNITY): Payer: Self-pay | Admitting: *Deleted

## 2013-06-02 DIAGNOSIS — Z951 Presence of aortocoronary bypass graft: Secondary | ICD-10-CM

## 2013-06-02 DIAGNOSIS — K219 Gastro-esophageal reflux disease without esophagitis: Secondary | ICD-10-CM | POA: Diagnosis present

## 2013-06-02 DIAGNOSIS — Z01810 Encounter for preprocedural cardiovascular examination: Secondary | ICD-10-CM

## 2013-06-02 DIAGNOSIS — Z96619 Presence of unspecified artificial shoulder joint: Secondary | ICD-10-CM

## 2013-06-02 DIAGNOSIS — Z01812 Encounter for preprocedural laboratory examination: Secondary | ICD-10-CM

## 2013-06-02 DIAGNOSIS — Z7982 Long term (current) use of aspirin: Secondary | ICD-10-CM

## 2013-06-02 DIAGNOSIS — E785 Hyperlipidemia, unspecified: Secondary | ICD-10-CM | POA: Diagnosis present

## 2013-06-02 DIAGNOSIS — Z23 Encounter for immunization: Secondary | ICD-10-CM

## 2013-06-02 DIAGNOSIS — Z79899 Other long term (current) drug therapy: Secondary | ICD-10-CM

## 2013-06-02 DIAGNOSIS — Z86711 Personal history of pulmonary embolism: Secondary | ICD-10-CM

## 2013-06-02 DIAGNOSIS — M19019 Primary osteoarthritis, unspecified shoulder: Principal | ICD-10-CM | POA: Diagnosis present

## 2013-06-02 DIAGNOSIS — I2510 Atherosclerotic heart disease of native coronary artery without angina pectoris: Secondary | ICD-10-CM | POA: Diagnosis present

## 2013-06-02 DIAGNOSIS — I10 Essential (primary) hypertension: Secondary | ICD-10-CM | POA: Diagnosis present

## 2013-06-02 DIAGNOSIS — Z87891 Personal history of nicotine dependence: Secondary | ICD-10-CM

## 2013-06-02 DIAGNOSIS — Z01818 Encounter for other preprocedural examination: Secondary | ICD-10-CM

## 2013-06-02 DIAGNOSIS — I252 Old myocardial infarction: Secondary | ICD-10-CM

## 2013-06-02 HISTORY — PX: TOTAL SHOULDER ARTHROPLASTY: SHX126

## 2013-06-02 SURGERY — ARTHROPLASTY, SHOULDER, TOTAL
Anesthesia: Regional | Site: Shoulder | Laterality: Right

## 2013-06-02 MED ORDER — EPHEDRINE SULFATE 50 MG/ML IJ SOLN
INTRAMUSCULAR | Status: DC | PRN
  Administered 2013-06-02: 10 mg via INTRAVENOUS

## 2013-06-02 MED ORDER — DEXAMETHASONE SODIUM PHOSPHATE 4 MG/ML IJ SOLN
INTRAMUSCULAR | Status: DC | PRN
  Administered 2013-06-02: 8 mg via INTRAVENOUS

## 2013-06-02 MED ORDER — ALBUTEROL SULFATE HFA 108 (90 BASE) MCG/ACT IN AERS
INHALATION_SPRAY | RESPIRATORY_TRACT | Status: DC | PRN
  Administered 2013-06-02 (×2): 2 via RESPIRATORY_TRACT

## 2013-06-02 MED ORDER — FENTANYL CITRATE 0.05 MG/ML IJ SOLN
INTRAMUSCULAR | Status: AC
  Filled 2013-06-02: qty 5

## 2013-06-02 MED ORDER — NITROGLYCERIN 0.4 MG SL SUBL: 0.4000 mg | SUBLINGUAL_TABLET | SUBLINGUAL | Status: DC | PRN

## 2013-06-02 MED ORDER — ALUM & MAG HYDROXIDE-SIMETH 200-200-20 MG/5ML PO SUSP: 30.0000 mL | ORAL | Status: DC | PRN

## 2013-06-02 MED ORDER — METOCLOPRAMIDE HCL 10 MG PO TABS
5.0000 mg | ORAL_TABLET | Freq: Three times a day (TID) | ORAL | Status: DC | PRN
Start: 2013-06-02 — End: 2013-06-03

## 2013-06-02 MED ORDER — BUPIVACAINE-EPINEPHRINE PF 0.5-1:200000 % IJ SOLN: INTRAMUSCULAR | Status: DC | PRN

## 2013-06-02 MED ORDER — SUCCINYLCHOLINE CHLORIDE 20 MG/ML IJ SOLN
INTRAMUSCULAR | Status: AC
  Filled 2013-06-02: qty 1

## 2013-06-02 MED ORDER — HYDROMORPHONE HCL PF 1 MG/ML IJ SOLN: 0.2500 mg | INTRAMUSCULAR | Status: DC | PRN

## 2013-06-02 MED ORDER — FENTANYL CITRATE 0.05 MG/ML IJ SOLN
INTRAMUSCULAR | Status: AC
  Filled 2013-06-02: qty 2

## 2013-06-02 MED ORDER — KETOROLAC TROMETHAMINE 30 MG/ML IJ SOLN
INTRAMUSCULAR | Status: AC
  Administered 2013-06-02: 15 mg
  Filled 2013-06-02: qty 1

## 2013-06-02 MED ORDER — FUROSEMIDE 40 MG PO TABS
40.0000 mg | ORAL_TABLET | ORAL | Status: DC
  Administered 2013-06-03: 40 mg via ORAL
  Filled 2013-06-02: qty 1

## 2013-06-02 MED ORDER — SODIUM CHLORIDE 0.9 % IR SOLN
Status: DC | PRN
  Administered 2013-06-02: 1

## 2013-06-02 MED ORDER — MENTHOL 3 MG MT LOZG: 1.0000 | LOZENGE | OROMUCOSAL | Status: DC | PRN

## 2013-06-02 MED ORDER — HYDROCHLOROTHIAZIDE 25 MG PO TABS
25.0000 mg | ORAL_TABLET | Freq: Every day | ORAL | Status: DC
  Administered 2013-06-03: 25 mg via ORAL
  Filled 2013-06-02: qty 1

## 2013-06-02 MED ORDER — LIDOCAINE HCL (CARDIAC) 20 MG/ML IV SOLN
INTRAVENOUS | Status: AC
  Filled 2013-06-02: qty 5

## 2013-06-02 MED ORDER — GLYCOPYRROLATE 0.2 MG/ML IJ SOLN
INTRAMUSCULAR | Status: AC
  Filled 2013-06-02: qty 4

## 2013-06-02 MED ORDER — ATORVASTATIN CALCIUM 80 MG PO TABS
80.0000 mg | ORAL_TABLET | Freq: Every day | ORAL | Status: DC
Start: 2013-06-03 — End: 2013-06-03
  Filled 2013-06-02: qty 1

## 2013-06-02 MED ORDER — PROPOFOL 10 MG/ML IV BOLUS
INTRAVENOUS | Status: DC | PRN
  Administered 2013-06-02: 30 mg via INTRAVENOUS
  Administered 2013-06-02: 120 mg via INTRAVENOUS

## 2013-06-02 MED ORDER — ARTIFICIAL TEARS OP OINT
TOPICAL_OINTMENT | OPHTHALMIC | Status: AC
  Filled 2013-06-02: qty 3.5

## 2013-06-02 MED ORDER — ONDANSETRON HCL 4 MG/2ML IJ SOLN: 4.0000 mg | Freq: Four times a day (QID) | INTRAMUSCULAR | Status: DC | PRN

## 2013-06-02 MED ORDER — ASPIRIN EC 81 MG PO TBEC
81.0000 mg | DELAYED_RELEASE_TABLET | Freq: Every day | ORAL | Status: DC
  Administered 2013-06-02 – 2013-06-03 (×2): 81 mg via ORAL
  Filled 2013-06-02 (×2): qty 1

## 2013-06-02 MED ORDER — LACTATED RINGERS IV SOLN
INTRAVENOUS | Status: DC
  Administered 2013-06-02: 15:00:00 via INTRAVENOUS

## 2013-06-02 MED ORDER — PHENYLEPHRINE HCL 10 MG/ML IJ SOLN
10.0000 mg | INTRAVENOUS | Status: DC | PRN
  Administered 2013-06-02: 20 ug/min via INTRAVENOUS

## 2013-06-02 MED ORDER — LACTATED RINGERS IV SOLN
INTRAVENOUS | Status: DC | PRN
  Administered 2013-06-02: 07:00:00 via INTRAVENOUS

## 2013-06-02 MED ORDER — ROCURONIUM BROMIDE 100 MG/10ML IV SOLN
INTRAVENOUS | Status: DC | PRN
  Administered 2013-06-02: 50 mg via INTRAVENOUS

## 2013-06-02 MED ORDER — STERILE WATER FOR INJECTION IJ SOLN
INTRAMUSCULAR | Status: AC
  Filled 2013-06-02: qty 10

## 2013-06-02 MED ORDER — ONDANSETRON HCL 4 MG/2ML IJ SOLN
INTRAMUSCULAR | Status: AC
  Filled 2013-06-02: qty 2

## 2013-06-02 MED ORDER — ALBUTEROL SULFATE HFA 108 (90 BASE) MCG/ACT IN AERS
INHALATION_SPRAY | RESPIRATORY_TRACT | Status: AC
  Filled 2013-06-02: qty 6.7

## 2013-06-02 MED ORDER — DIPHENHYDRAMINE HCL 25 MG PO TABS
25.0000 mg | ORAL_TABLET | Freq: Two times a day (BID) | ORAL | Status: DC
  Administered 2013-06-02 – 2013-06-03 (×2): 25 mg via ORAL
  Filled 2013-06-02 (×4): qty 1

## 2013-06-02 MED ORDER — PANTOPRAZOLE SODIUM 40 MG PO TBEC
40.0000 mg | DELAYED_RELEASE_TABLET | Freq: Every day | ORAL | Status: DC
  Administered 2013-06-02 – 2013-06-03 (×2): 40 mg via ORAL
  Filled 2013-06-02 (×2): qty 1

## 2013-06-02 MED ORDER — TELMISARTAN-HCTZ 80-25 MG PO TABS: 1.0000 | ORAL_TABLET | Freq: Every day | ORAL | Status: DC

## 2013-06-02 MED ORDER — PNEUMOCOCCAL VAC POLYVALENT 25 MCG/0.5ML IJ INJ
0.5000 mL | INJECTION | INTRAMUSCULAR | Status: AC
  Administered 2013-06-03: 0.5 mL via INTRAMUSCULAR
  Filled 2013-06-02: qty 0.5

## 2013-06-02 MED ORDER — POLYETHYLENE GLYCOL 3350 17 G PO PACK: 17.0000 g | PACK | Freq: Every day | ORAL | Status: DC | PRN

## 2013-06-02 MED ORDER — FLEET ENEMA 7-19 GM/118ML RE ENEM: 1.0000 | ENEMA | Freq: Once | RECTAL | Status: AC | PRN

## 2013-06-02 MED ORDER — GLYCOPYRROLATE 0.2 MG/ML IJ SOLN
INTRAMUSCULAR | Status: DC | PRN
  Administered 2013-06-02: 0.2 mg via INTRAVENOUS
  Administered 2013-06-02: .6 mg via INTRAVENOUS

## 2013-06-02 MED ORDER — PHENOL 1.4 % MT LIQD: 1.0000 | OROMUCOSAL | Status: DC | PRN

## 2013-06-02 MED ORDER — LIDOCAINE HCL (CARDIAC) 20 MG/ML IV SOLN
INTRAVENOUS | Status: DC | PRN
  Administered 2013-06-02: 80 mg via INTRAVENOUS

## 2013-06-02 MED ORDER — OXYCODONE HCL 5 MG PO TABS
ORAL_TABLET | ORAL | Status: AC
  Filled 2013-06-02: qty 1

## 2013-06-02 MED ORDER — PROPOFOL 10 MG/ML IV BOLUS
INTRAVENOUS | Status: AC
  Filled 2013-06-02: qty 20

## 2013-06-02 MED ORDER — EPHEDRINE SULFATE 50 MG/ML IJ SOLN
INTRAMUSCULAR | Status: AC
  Filled 2013-06-02: qty 1

## 2013-06-02 MED ORDER — AMLODIPINE BESYLATE 5 MG PO TABS
5.0000 mg | ORAL_TABLET | Freq: Every day | ORAL | Status: DC
  Administered 2013-06-03: 5 mg via ORAL
  Filled 2013-06-02: qty 1

## 2013-06-02 MED ORDER — GLYCOPYRROLATE 0.2 MG/ML IJ SOLN
INTRAMUSCULAR | Status: AC
  Filled 2013-06-02: qty 1

## 2013-06-02 MED ORDER — OXYCODONE-ACETAMINOPHEN 5-325 MG PO TABS
1.0000 | ORAL_TABLET | ORAL | Status: DC | PRN
  Administered 2013-06-03: 2 via ORAL
  Filled 2013-06-02: qty 2

## 2013-06-02 MED ORDER — ONDANSETRON HCL 4 MG/2ML IJ SOLN
INTRAMUSCULAR | Status: DC | PRN
  Administered 2013-06-02: 4 mg via INTRAVENOUS

## 2013-06-02 MED ORDER — NEOSTIGMINE METHYLSULFATE 1 MG/ML IJ SOLN
INTRAMUSCULAR | Status: DC | PRN
  Administered 2013-06-02: 4 mg via INTRAVENOUS

## 2013-06-02 MED ORDER — KETOROLAC TROMETHAMINE 15 MG/ML IJ SOLN
15.0000 mg | Freq: Four times a day (QID) | INTRAMUSCULAR | Status: DC
  Administered 2013-06-02 – 2013-06-03 (×4): 15 mg via INTRAVENOUS
  Filled 2013-06-02 (×9): qty 1

## 2013-06-02 MED ORDER — MIDAZOLAM HCL 5 MG/5ML IJ SOLN
INTRAMUSCULAR | Status: DC | PRN
  Administered 2013-06-02 (×2): 1 mg via INTRAVENOUS

## 2013-06-02 MED ORDER — POTASSIUM CHLORIDE ER 10 MEQ PO TBCR
10.0000 meq | EXTENDED_RELEASE_TABLET | ORAL | Status: DC
  Administered 2013-06-03: 10 meq via ORAL
  Filled 2013-06-02: qty 1

## 2013-06-02 MED ORDER — MIDAZOLAM HCL 2 MG/2ML IJ SOLN
INTRAMUSCULAR | Status: AC
  Filled 2013-06-02: qty 2

## 2013-06-02 MED ORDER — BUPIVACAINE-EPINEPHRINE PF 0.5-1:200000 % IJ SOLN
INTRAMUSCULAR | Status: DC | PRN
  Administered 2013-06-02: 30 mL via PERINEURAL

## 2013-06-02 MED ORDER — NEOSTIGMINE METHYLSULFATE 1 MG/ML IJ SOLN
INTRAMUSCULAR | Status: AC
  Filled 2013-06-02: qty 20

## 2013-06-02 MED ORDER — LACTATED RINGERS IV SOLN: INTRAVENOUS | Status: DC

## 2013-06-02 MED ORDER — FENTANYL CITRATE 0.05 MG/ML IJ SOLN: 25.0000 ug | INTRAMUSCULAR | Status: DC | PRN

## 2013-06-02 MED ORDER — ACETAMINOPHEN 325 MG PO TABS: 650.0000 mg | ORAL_TABLET | Freq: Four times a day (QID) | ORAL | Status: DC | PRN

## 2013-06-02 MED ORDER — OXYCODONE HCL 5 MG PO TABS
5.0000 mg | ORAL_TABLET | Freq: Once | ORAL | Status: AC
  Administered 2013-06-02: 5 mg via ORAL

## 2013-06-02 MED ORDER — BISACODYL 5 MG PO TBEC: 5.0000 mg | DELAYED_RELEASE_TABLET | Freq: Every day | ORAL | Status: DC | PRN

## 2013-06-02 MED ORDER — DEXAMETHASONE SODIUM PHOSPHATE 4 MG/ML IJ SOLN
INTRAMUSCULAR | Status: AC
  Filled 2013-06-02: qty 2

## 2013-06-02 MED ORDER — DOCUSATE SODIUM 100 MG PO CAPS
100.0000 mg | ORAL_CAPSULE | Freq: Two times a day (BID) | ORAL | Status: DC
  Administered 2013-06-02 – 2013-06-03 (×3): 100 mg via ORAL
  Filled 2013-06-02 (×4): qty 1

## 2013-06-02 MED ORDER — ALBUTEROL SULFATE (2.5 MG/3ML) 0.083% IN NEBU
2.5000 mg | INHALATION_SOLUTION | Freq: Four times a day (QID) | RESPIRATORY_TRACT | Status: DC | PRN
  Filled 2013-06-02: qty 3

## 2013-06-02 MED ORDER — METOCLOPRAMIDE HCL 5 MG/ML IJ SOLN
5.0000 mg | Freq: Three times a day (TID) | INTRAMUSCULAR | Status: DC | PRN
Start: 2013-06-02 — End: 2013-06-03

## 2013-06-02 MED ORDER — ACETAMINOPHEN 650 MG RE SUPP: 650.0000 mg | Freq: Four times a day (QID) | RECTAL | Status: DC | PRN

## 2013-06-02 MED ORDER — IRBESARTAN 300 MG PO TABS
300.0000 mg | ORAL_TABLET | Freq: Every day | ORAL | Status: DC
  Administered 2013-06-03: 300 mg via ORAL
  Filled 2013-06-02: qty 1

## 2013-06-02 MED ORDER — DIAZEPAM 5 MG PO TABS: 5.0000 mg | ORAL_TABLET | Freq: Four times a day (QID) | ORAL | Status: DC | PRN

## 2013-06-02 MED ORDER — MECLIZINE HCL 25 MG PO TABS
25.0000 mg | ORAL_TABLET | Freq: Every morning | ORAL | Status: DC
  Administered 2013-06-02 – 2013-06-03 (×2): 25 mg via ORAL
  Filled 2013-06-02 (×2): qty 1

## 2013-06-02 MED ORDER — PHENYLEPHRINE HCL 10 MG/ML IJ SOLN
INTRAMUSCULAR | Status: DC | PRN
  Administered 2013-06-02 (×4): 80 ug via INTRAVENOUS

## 2013-06-02 MED ORDER — DIPHENHYDRAMINE HCL 12.5 MG/5ML PO ELIX: 12.5000 mg | ORAL_SOLUTION | ORAL | Status: DC | PRN

## 2013-06-02 MED ORDER — ROCURONIUM BROMIDE 50 MG/5ML IV SOLN
INTRAVENOUS | Status: AC
  Filled 2013-06-02: qty 1

## 2013-06-02 MED ORDER — NEOSTIGMINE METHYLSULFATE 1 MG/ML IJ SOLN
INTRAMUSCULAR | Status: AC
  Filled 2013-06-02: qty 10

## 2013-06-02 MED ORDER — PROMETHAZINE HCL 25 MG/ML IJ SOLN: 6.2500 mg | INTRAMUSCULAR | Status: DC | PRN

## 2013-06-02 MED ORDER — ONDANSETRON HCL 4 MG PO TABS: 4.0000 mg | ORAL_TABLET | Freq: Four times a day (QID) | ORAL | Status: DC | PRN

## 2013-06-02 MED ORDER — MEPERIDINE HCL 25 MG/ML IJ SOLN: 6.2500 mg | INTRAMUSCULAR | Status: DC | PRN

## 2013-06-02 MED ORDER — FENTANYL CITRATE 0.05 MG/ML IJ SOLN
INTRAMUSCULAR | Status: DC | PRN
  Administered 2013-06-02: 50 ug via INTRAVENOUS
  Administered 2013-06-02: 100 ug via INTRAVENOUS

## 2013-06-02 MED ORDER — CEFAZOLIN SODIUM-DEXTROSE 2-3 GM-% IV SOLR
2.0000 g | Freq: Four times a day (QID) | INTRAVENOUS | Status: AC
  Administered 2013-06-02 – 2013-06-03 (×3): 2 g via INTRAVENOUS
  Filled 2013-06-02 (×3): qty 50

## 2013-06-02 SURGICAL SUPPLY — 68 items
BLADE SAW SGTL 83.5X18.5 (BLADE) ×3 IMPLANT
CEMENT BONE DEPUY (Cement) ×2 IMPLANT
CLOSURE STERI-STRIP 1/2X4 (GAUZE/BANDAGES/DRESSINGS) ×1
CLOSURE WOUND 1/2 X4 (GAUZE/BANDAGES/DRESSINGS) ×1
CLSR STERI-STRIP ANTIMIC 1/2X4 (GAUZE/BANDAGES/DRESSINGS) ×1 IMPLANT
COVER SURGICAL LIGHT HANDLE (MISCELLANEOUS) ×3 IMPLANT
DRAPE INCISE IOBAN 66X45 STRL (DRAPES) ×3 IMPLANT
DRAPE SURG 17X11 SM STRL (DRAPES) ×3 IMPLANT
DRAPE SURG 17X23 STRL (DRAPES) ×3 IMPLANT
DRAPE U-SHAPE 47X51 STRL (DRAPES) ×3 IMPLANT
DRILL BIT 7/64X5 (BIT) IMPLANT
DRSG AQUACEL AG ADV 3.5X10 (GAUZE/BANDAGES/DRESSINGS) ×3 IMPLANT
DRSG MEPILEX BORDER 4X4 (GAUZE/BANDAGES/DRESSINGS) ×3 IMPLANT
DRSG MEPILEX BORDER 4X8 (GAUZE/BANDAGES/DRESSINGS) ×3 IMPLANT
DURAPREP 26ML APPLICATOR (WOUND CARE) ×6 IMPLANT
ELECT BLADE 4.0 EZ CLEAN MEGAD (MISCELLANEOUS) ×3
ELECT CAUTERY BLADE 6.4 (BLADE) ×3 IMPLANT
ELECT REM PT RETURN 9FT ADLT (ELECTROSURGICAL) ×3
ELECTRODE BLDE 4.0 EZ CLN MEGD (MISCELLANEOUS) ×1 IMPLANT
ELECTRODE REM PT RTRN 9FT ADLT (ELECTROSURGICAL) ×1 IMPLANT
FACESHIELD STD STERILE (MASK) ×2 IMPLANT
FACESHIELD WRAPAROUND (MASK) ×9 IMPLANT
FACESHIELD WRAPAROUND OR TEAM (MASK) ×3 IMPLANT
GLENOID ANCHOR PEG CROSSLK 48 (Orthopedic Implant) ×2 IMPLANT
GLOVE BIO SURGEON STRL SZ7.5 (GLOVE) ×3 IMPLANT
GLOVE BIO SURGEON STRL SZ8 (GLOVE) ×3 IMPLANT
GLOVE EUDERMIC 7 POWDERFREE (GLOVE) ×3 IMPLANT
GLOVE SS BIOGEL STRL SZ 7.5 (GLOVE) ×1 IMPLANT
GLOVE SUPERSENSE BIOGEL SZ 7.5 (GLOVE) ×2
GOWN STRL REUS W/ TWL LRG LVL3 (GOWN DISPOSABLE) ×1 IMPLANT
GOWN STRL REUS W/ TWL XL LVL3 (GOWN DISPOSABLE) ×2 IMPLANT
GOWN STRL REUS W/TWL LRG LVL3 (GOWN DISPOSABLE) ×3
GOWN STRL REUS W/TWL XL LVL3 (GOWN DISPOSABLE) ×6
HEAD HUMERAL 52MMX18MM (Trauma) ×1 IMPLANT
HUMERAL HEAD 52MMX18MM (Trauma) ×3 IMPLANT
HUMERAL STEM 14MM (Trauma) ×2 IMPLANT
KIT BASIN OR (CUSTOM PROCEDURE TRAY) ×3 IMPLANT
KIT ROOM TURNOVER OR (KITS) ×3 IMPLANT
MANIFOLD NEPTUNE II (INSTRUMENTS) ×3 IMPLANT
NDL HYPO 25GX1X1/2 BEV (NEEDLE) IMPLANT
NDL SUT 6 .5 CRC .975X.05 MAYO (NEEDLE) ×1 IMPLANT
NEEDLE HYPO 25GX1X1/2 BEV (NEEDLE) ×3 IMPLANT
NEEDLE MAYO TAPER (NEEDLE) ×3
NS IRRIG 1000ML POUR BTL (IV SOLUTION) ×3 IMPLANT
PACK SHOULDER (CUSTOM PROCEDURE TRAY) ×3 IMPLANT
PAD ARMBOARD 7.5X6 YLW CONV (MISCELLANEOUS) ×6 IMPLANT
PASSER SUT SWANSON 36MM LOOP (INSTRUMENTS) ×2 IMPLANT
PIN METAGLENE 2.5 (PIN) ×2 IMPLANT
SLING ARM LRG ADULT FOAM STRAP (SOFTGOODS) IMPLANT
SLING ARM XL FOAM STRAP (SOFTGOODS) ×3 IMPLANT
SMARTMIX MINI TOWER (MISCELLANEOUS) ×3
SPONGE LAP 18X18 X RAY DECT (DISPOSABLE) ×3 IMPLANT
SPONGE LAP 4X18 X RAY DECT (DISPOSABLE) ×3 IMPLANT
STRIP CLOSURE SKIN 1/2X4 (GAUZE/BANDAGES/DRESSINGS) ×2 IMPLANT
SUCTION FRAZIER TIP 10 FR DISP (SUCTIONS) ×3 IMPLANT
SUT BONE WAX W31G (SUTURE) ×3 IMPLANT
SUT FIBERWIRE #2 38 T-5 BLUE (SUTURE) ×9
SUT MNCRL AB 3-0 PS2 18 (SUTURE) ×3 IMPLANT
SUT VIC AB 1 CT1 27 (SUTURE) ×9
SUT VIC AB 1 CT1 27XBRD ANBCTR (SUTURE) ×3 IMPLANT
SUT VIC AB 2-0 CT1 27 (SUTURE) ×6
SUT VIC AB 2-0 CT1 TAPERPNT 27 (SUTURE) ×2 IMPLANT
SUTURE FIBERWR #2 38 T-5 BLUE (SUTURE) ×2 IMPLANT
SYR CONTROL 10ML LL (SYRINGE) IMPLANT
TOWEL OR 17X24 6PK STRL BLUE (TOWEL DISPOSABLE) ×3 IMPLANT
TOWEL OR 17X26 10 PK STRL BLUE (TOWEL DISPOSABLE) ×3 IMPLANT
TOWER SMARTMIX MINI (MISCELLANEOUS) ×1 IMPLANT
WATER STERILE IRR 1000ML POUR (IV SOLUTION) ×3 IMPLANT

## 2013-06-02 NOTE — Op Note (Signed)
Brent [name], Brent [name]                 ACCOUNT NO.:  [id number]  MEDICAL RECORD NO.:  [id number]  LOCATION:  5N28C                        FACILITY:  [facility]  PHYSICIAN:  Metta Clines. Loring Liskey, M.D.  DATE OF BIRTH:  Jul 19, 1931  DATE OF PROCEDURE:  06/02/2013 DATE OF DISCHARGE:                              OPERATIVE REPORT   PREOPERATIVE DIAGNOSIS:  End-stage osteoarthritis of the right shoulder.  POSTOPERATIVE DIAGNOSIS:  End-stage osteoarthritis of the right shoulder.  PROCEDURE:  Right total shoulder arthroplasty utilizing a Press-Fit size 14, DePuy global stem with a 52 x 18 eccentric head and a cemented pegged 48 glenoid.  SURGEON:  Metta Clines. Negin Hegg, M.D.  Terrence DupontOlivia Mackie A. Shuford, P.A.-C.  ANESTHESIA:  General endotracheal as well as an interscalene block.  ESTIMATED BLOOD LOSS:  150 mL.  DRAINS:  None.  HISTORY:  Brent [name] is a 81 year old gentleman who has had chronic and progressive increasing right shoulder pain related to end-stage osteoarthritis.  Due to his increasing functional limitations, pain and deteriorated quality of life, he was brought to the operating room at this time for planned right total shoulder arthroplasty.  Preoperatively, I counseled Brent [name] regarding treatment options and risks versus benefits thereof.  Possible surgical complications were all reviewed including potential for bleeding, infection, neurovascular injury, persistent pain, loss of motion, failure of the implant and potential for anesthetic complication, possible need for additional surgery.  He understands and accepts and agrees with our planned procedure.  PROCEDURE IN DETAIL:  After undergoing routine preop evaluation, the patient received prophylactic antibiotics.  Interscalene block was established in the holding area by the Anesthesia Department.  The patient was brought to the operating room, placed supine on the operating table.  At this point, the patient was noted to have  developed some wheezing and some reactive airway disease.  He was given an albuterol nebulizer treatment and maintained stable hemodynamic status throughout.  At this point, he was intubated, which did require the use of the GlideScope.  Upon in sedation, then it was concerned that he had decreased breath sounds in the left lung fields.  Had also been complaining of some shortness of breath prior to intubation, which was thought to be related to the interscalene block and phrenic nerve paresis.  Once intubated, he did remain hemodynamically stable.  We obtained an intraoperative chest x-ray, which did not show any evidence for pneumothorax or acute change in his overall lung status and remained stable, and after 15 or 20 minutes, indeed, the breath sounds on the left side returned and became equalized and given that he was hemodynamically stable throughout this entire event, it was felt that he perhaps had a mucus plug of some sort and given that his parameters had all normalized.  We elected to proceed with the surgery.  At this point, he was then placed in the beach-chair position with the right shoulder girdle region was sterilely prepped and draped in standard fashion. Time-out was called.  An anterior approach to the right shoulder was made through a 12-cm deltopectoral incision.  Skin flaps were elevated and electrocautery was used for hemostasis.  The deltopectoral interval was then developed from  proximal to distal.  Cephalic vein retracted laterally of the deltoid.  The upper centimeter and half of the pectoralis major was tenotomized to enhance exposure.  The divided adhesions beneath the deltoid and the conjoined tendon was identified, mobilized, and retracted medially with several retaining retractors placed.  The biceps tendon was identified and roofed from the bicipital groove, Tenotomized for later tenodesis.  We then split the rotator cuff along the rotator interval from  the apex of the bicipital groove to the base of the coracoid and then subscapularis was divided away from its lesser tuberosity attachment leaving a cm and a half cuff of tissue for later repair.  The free margin was tagged with a series of FiberWire sutures.  Subscapularis was then reflected medially and the capsular attachments were divided from the anterior-inferior and posterior- inferior aspect of the humeral neck and a large osteophyte was identified.  At this point, the humeral head was delivered through the wound.  The extramedullary guide was then used to outline our proposed humeral head resection, this was then performed with an oscillating saw at approximately 30 degrees of retroversion to protect the rotator cuff. At this point, we then reamed the canal with hand reamers up to size 14. The cookie cutter broach was then utilized to broach the humerus and we then broached it up to size 14 and then carefully removed all osteophytes on the humeral head and neck.  At this point, the metal cap was placed over the metaphyseal surface of the proximal humerus and we exposed the glenoid with combination of Fukuda, pitchfork, and snake tongue retractors.  I performed a circumferential capsular release and removed the labrum from the margins of the glenoid and there was significant osteophytic spurring at the margins of the glenoid particularly anteriorly and inferiorly and these were all removed. Guidepin was then placed in the center of the glenoid and reamed with a 48 reamer to a stable subchondral bony bed.  The central drill hole was then made followed by the peripheral peg holes.  The trial 48 glenoid showed good fit and fixation.  We then completed the removal of residual bone, debride the margins of the glenoid, it was irrigated and dried. Cement was mixed in the appropriate consistency.  We introduced cement into the peripheral peg holes.  The 48 glenoid was then impacted  into position and allowed to harden.  At this point, we then returned our attention to the proximal humerus where we irrigated the canal, we implanted our stem, and used abundant bone graft, harvested from the resected head to bone graft the metaphyseal segment of the stem and the stem was then terminally seated with excellent fit and fixation.  We then performed some trial reductions and ultimately the 52 x 18 eccentric head had the best soft tissue balance with good coverage of the proximal humerus, and 50% posterior translation over the glenoid. At this point, the trial was removed.  The final head was impacted after the [facility] taper was meticulously cleaned and dried.  Final reduction was performed showing excellent fit and fixation with good soft tissue balance and excellent stability.  At this point, the subscapularis was then repaired back to the cuff of tissue on the lesser tuberosity with a #2 Fiber wires and performed some additional oversewing of this repair with #2 Fiber wires inferiorly, allowing excellent reapposition and also closed the rotator interval with a pair of figure-of-eight #2 Fiber wires superiorly.  This allowed easily 30 degrees of  external rotation without excessive tension on the repair.  The wound was then irrigated. Hemostasis was obtained.  The deltopectoral interval was then reapproximated with 0 Vicryl figure-of-eight sutures.  2-0 Vicryl was used for the subcu layer and intracuticular 3-0 Monocryl for the skin followed by Steri-Strips.  Dry dressings were applied.  Right arm was placed in a sling.  The patient was awakened, extubated, and taken to the recovery room in stable condition.  Jenetta Loges, PA-C was used as an Environmental consultant throughout this case essential for help with positioning of the patient, positioning of the extremity, management of the retractors, tissue manipulation, implantation of the prosthesis, wound closer, and  intraoperative decision making.     Metta Clines. Tytan Sandate, M.D.     KMS/MEDQ  D:  06/02/2013  T:  06/02/2013  Job:  [id number]

## 2013-06-02 NOTE — Transfer of Care (Signed)
Immediate Anesthesia Transfer of Care Note  Patient: Brent [name]  Procedure(s) Performed: Procedure(s): RIGHT TOTAL SHOULDER ARTHROPLASTY (Right)  Patient Location: PACU  Anesthesia Type:General and Regional  Level of Consciousness: awake and alert   Airway & Oxygen Therapy: Patient Spontanous Breathing and Patient connected to nasal cannula oxygen  Post-op Assessment: Report given to PACU RN and Post -op Vital signs reviewed and stable  Post vital signs: Reviewed and stable  Complications: No apparent anesthesia complications

## 2013-06-02 NOTE — Anesthesia Preprocedure Evaluation (Addendum)
Anesthesia Evaluation  Patient identified by MRN, date of birth, ID band Patient awake    Reviewed: Allergy & Precautions, H&P , NPO status , Patient's Chart, lab work & pertinent test results  History of Anesthesia Complications (+) PONV and history of anesthetic complications  Airway Mallampati: III TM Distance: >3 FB Neck ROM: Full    Dental  (+) Dental Advisory Given, Teeth Intact   Pulmonary former smoker, PE breath sounds clear to auscultation  Pulmonary exam normal       Cardiovascular hypertension, Pt. on medications + CAD, + Past MI and + CABG Rhythm:Regular Rate:Bradycardia   05/05/12 2D Echo:  Study Conclusions  - Left ventricle: The cavity size was mildly dilated. Wall   thickness was increased in a pattern of mild LVH. Systolic function was mildly to moderately reduced. The estimated ejection fraction was in the range of 40% to 45%. - Left atrium: The atrium was moderately dilated. - Right ventricle: Systolic function was mildly reduced. - Right atrium: The atrium was mildly to moderately dilated. - Pulmonary arteries: PA peak pressure: 47mm Hg (S).   Neuro/Psych negative neurological ROS  negative psych ROS   GI/Hepatic Neg liver ROS, GERD-  Medicated and Controlled,  Endo/Other  Morbid obesityHyperlipidemia  Renal/GU negative Renal ROS  negative genitourinary   Musculoskeletal  (+) Arthritis -,   Abdominal   Peds  Hematology negative hematology ROS (+)   Anesthesia Other Findings   Reproductive/Obstetrics                       Anesthesia Physical Anesthesia Plan  ASA: III  Anesthesia Plan: General and Regional   Post-op Pain Management: MAC Combined w/ Regional for Post-op pain   Induction: Intravenous  Airway Management Planned: Oral ETT  Additional Equipment:   Intra-op Plan:   Post-operative Plan: Extubation in OR  Informed Consent: I have reviewed the  patients History and Physical, chart, labs and discussed the procedure including the risks, benefits and alternatives for the proposed anesthesia with the patient or authorized representative who has indicated his/her understanding and acceptance.   Dental advisory given  Plan Discussed with: CRNA, Anesthesiologist and Surgeon  Anesthesia Plan Comments:        Anesthesia Quick Evaluation

## 2013-06-02 NOTE — Anesthesia Postprocedure Evaluation (Signed)
  Anesthesia Post-op Note  Patient: Brent [name]  Procedure(s) Performed: Procedure(s): RIGHT TOTAL SHOULDER ARTHROPLASTY (Right)  Patient Location: PACU  Anesthesia Type:General and Regional  Level of Consciousness: awake, alert  and oriented  Airway and Oxygen Therapy: Patient Spontanous Breathing and Patient connected to nasal cannula oxygen  Post-op Pain: mild  Post-op Assessment: Post-op Vital signs reviewed, Patient's Cardiovascular Status Stable, Respiratory Function Stable, Patent Airway, No signs of Nausea or vomiting and Pain level controlled  Post-op Vital Signs: Reviewed and stable  Last Vitals:  Filed Vitals:   06/02/13 1145  BP:   Pulse: 55  Temp:   Resp: 14    Complications: No apparent anesthesia complications

## 2013-06-02 NOTE — Discharge Instructions (Signed)
° °  Kevin M. Supple, M.D., F.A.A.O.S. °Orthopaedic Surgery °Specializing in Arthroscopic and Reconstructive °Surgery of the Shoulder and Knee °[phone] °[address] - [place],  [address] - Fax [phone] ° ° °POST-OP TOTAL SHOULDER REPLACEMENT/SHOULDER HEMIARTHROPLASTY INSTRUCTIONS ° °1. Call the office at [phone] to schedule your first post-op appointment 10-14 days from the date of your surgery. ° °2. The bandage over your incision is waterproof. You may begin showering with this dressing on. You may leave this dressing on until first follow up appointment within 2 weeks. If you would like to remove it you may do so after the 5th day. Go slow and tug at the borders gently to break the bond the dressing has with the skin. The steri strips may come off with the dressing. At this point if there is no drainage it is okay to go without a bandage or you may cover it with a light guaze and tape. Leave the steri-strips in place over your incision. You can expect drainage that is bloody or yellow in nature that should gradually decrease from day of surgery. Change your dressing daily until drainage is completely resolved, then you may feel free to go without a bandage. You can also expect significant bruising around your shoulder that will drift down your arm and into your chest wall. This is very normal and should resolve over several days. ° ° 3. Wear your sling/immobilizer at all times except to perform the exercises below or to occasionally let your arm dangle by your side to stretch your elbow. You also need to sleep in your sling immobilizer until instructed otherwise. ° °4. Range of motion to your elbow, wrist, and hand are encouraged 3-5 times daily. Exercise to your hand and fingers helps to reduce swelling you may experience. ° °5. Utilize ice to the shoulder 3-5 times minimum a day and additionally if you are experiencing pain. ° °6. Prescriptions for a pain medication and a muscle  relaxant are provided for you. It is recommended that if you are experiencing pain that you pain medication alone is not controlling, add the muscle relaxant along with the pain medication which can give additional pain relief. The first 1-2 days is generally the most severe of your pain and then should gradually decrease. As your pain lessens it is recommended that you decrease your use of the pain medications to an "as needed basis'" only and to always comply with the recommended dosages of the pain medications. ° °7. Pain medications can produce constipation along with their use. If you experience this, the use of an over the counter stool softener or laxative daily is recommended.  ° °8. For most patients, if insurance allows, home health services to include therapy has been arranged. ° °9. For additional questions or concerns, please do not hesitate to call the office. If after hours there is an answering service to forward your concerns to the physician on call. ° °POST-OP EXERCISES ° °Pendulum Exercises ° °Perform pendulum exercises while standing and bending at the waist. Support your uninvolved arm on a table or chair and allow your operated arm to hang freely. Make sure to do these exercises passively - not using you shoulder muscles. ° °Repeat 20 times. Do 3 sessions per day. ° ° ° ° °

## 2013-06-02 NOTE — Op Note (Signed)
06/02/2013  10:33 AM  PATIENT:   Brent [name]  80 y.o. male  PRE-OPERATIVE DIAGNOSIS:  OA RIGHT SHOULDER  POST-OPERATIVE DIAGNOSIS:  same  PROCEDURE:  R TSA #14 stem, 52x18 eccentric head, 48 glenoid  SURGEON:  Gavriella Hearst, Metta Clines M.D.  ASSISTANTS: Shuford pac   ANESTHESIA:   GET + ISB  EBL: 250  SPECIMEN:  none  Drains: none   PATIENT DISPOSITION:  PACU - hemodynamically stable.    PLAN OF CARE: Admit to inpatient   Dictation# [id number]

## 2013-06-02 NOTE — H&P (Signed)
Brent [name]    Chief Complaint: OA RIGHT SHOULDER HPI: The patient is a 80 y.o. male with end stage right shoulder OA  Past Medical History  Diagnosis Date  . CAD (coronary artery disease)     Status post CABG  . Hyperlipidemia     takes Crestor daily  . Back pain   . Erectile dysfunction   . History of blood clots     to the lungs   . Pulmonary embolism   . GERD (gastroesophageal reflux disease)     takes Omeprazole daily  . Dizziness     takes Antivert daily  . HTN (hypertension)     takes Amlodipine and Micardis daily  . PONV (postoperative nausea and vomiting)     occ  . Myocardial infarction   . Arthritis     Past Surgical History  Procedure Laterality Date  . Coronary artery bypass graft  1998    there are sequential 90% stenosis in the proximal LAD, and the mid and distal LAD is a moderate sized vessel. The mid and first diagonal branch has a 50-60% stenosis.   . Cardiovascular stress test  04-28-2005    EF 53%  . Broken jaw      20+ years ago  . Broken ankle    . Esophagoscopy  06/25/2011    Procedure: ESOPHAGOSCOPY;  Surgeon: Izora Gala, MD;  Location: [facility];  Service: ENT;  Laterality: N/A;  Direct Esophagoscopy  . Tonsillectomy      History reviewed. No pertinent family history.  Social History:  reports that he quit smoking about 30 years ago. His smoking use included Cigarettes. He has a 20 pack-year smoking history. He does not have any smokeless tobacco history on file. He reports that he drinks about .6 ounces of alcohol per week. He reports that he does not use illicit drugs.  Allergies:  Allergies  Allergen Reactions  . Morphine And Related     Itch     Medications Prior to Admission  Medication Sig Dispense Refill  . amLODipine (NORVASC) 5 MG tablet Take 1 tablet (5 mg total) by mouth daily.  30 tablet  4  . aspirin EC 81 MG tablet Take 81 mg by mouth daily.      . diphenhydrAMINE (ALLERGY) 25 MG tablet Take 25 mg by mouth 2 (two) times  daily.      . furosemide (LASIX) 40 MG tablet Take 40 mg by mouth 3 (three) times a week. Monday, Wednesday and friday      . Glucosamine-Chondroit-Vit C-Mn (GLUCOSAMINE 1500 COMPLEX PO) Take 1 tablet by mouth 2 (two) times daily.      . meclizine (ANTIVERT) 25 MG tablet Take 25 mg by mouth every morning.      . Multiple Vitamins-Minerals (MENS 50+ ADVANCED PO) Take 1 tablet by mouth daily.      . Omega-3 Fatty Acids (FISH OIL PO) Take 1 capsule by mouth daily.       Marland Kitchen omeprazole (PRILOSEC) 20 MG capsule Take 20 mg by mouth 2 (two) times daily.      . potassium chloride (K-DUR) 10 MEQ tablet Take 10 mEq by mouth 3 (three) times a week. Monday,wednesday and friday      . rosuvastatin (CRESTOR) 40 MG tablet Take 1 tablet (40 mg total) by mouth daily.  30 tablet  6  . telmisartan-hydrochlorothiazide (MICARDIS HCT) 80-25 MG per tablet Take 1 tablet by mouth daily.      . traMADol Veatrice Bourbon)  50 MG tablet Take 50 mg by mouth every 6 (six) hours as needed. pain      . vitamin C (ASCORBIC ACID) 500 MG tablet Take 1,000 mg by mouth daily.      . nitroGLYCERIN (NITROSTAT) 0.4 MG SL tablet Place 0.4 mg under the tongue every 5 (five) minutes as needed. For chest pain         Physical Exam: right shoulder with painful and restricted motion as noted at recent office visits  Vitals  Temp:  [97.5 F (36.4 C)] 97.5 F (36.4 C) (04/09 0602) Pulse Rate:  [56-62] 58 (04/09 0723) Resp:  [20] 20 (04/09 0602) BP: (158)/(54) 158/54 mmHg (04/09 0602) SpO2:  [99 %] 99 % (04/09 0602) Weight:  [112.946 kg (249 lb)] 112.946 kg (249 lb) (04/09 0602)  Assessment/Plan  Impression: OA RIGHT SHOULDER  Plan of Action: Procedure(s): RIGHT TOTAL SHOULDER ARTHROPLASTY  Brent [name] 06/02/2013, 7:25 AM

## 2013-06-02 NOTE — Anesthesia Procedure Notes (Addendum)
Procedure Name: Intubation Date/Time: 06/02/2013 7:58 AM Performed by: Jacob Moores Pre-anesthesia Checklist: Patient identified, Emergency Drugs available, Suction available and Patient being monitored Patient Re-evaluated:Patient Re-evaluated prior to inductionOxygen Delivery Method: Circle system utilized Preoxygenation: Pre-oxygenation with 100% oxygen Intubation Type: IV induction Ventilation: Mask ventilation without difficulty Laryngoscope Size: Miller and 2 Grade View: Grade III Tube type: Oral Tube size: 7.5 mm Number of attempts: 2 Airway Equipment and Method: Stylet and Video-laryngoscopy Placement Confirmation: ETT inserted through vocal cords under direct vision,  positive ETCO2 and breath sounds checked- equal and bilateral Secured at: 21 cm Tube secured with: Tape Dental Injury: Teeth and Oropharynx as per pre-operative assessment  Difficulty Due To: Difficult Airway- due to reduced neck mobility Comments: DL x 1 with Sabra Heck 2 by CRNA. Grade 3 view obtained. Unable to pass tube. Resumed mask ventilation and intubation attempt x 2 with glidescope to obtain grade 1 view.    Anesthesia Regional Block:  Interscalene brachial plexus block  Pre-Anesthetic Checklist: ,, timeout performed, Correct Patient, Correct Site, Correct Laterality, Correct Procedure, Correct Position, risks and benefits discussed, surgical consent, pre-op evaluation,  At surgeon's request and post-op pain management  Laterality: Right  Prep: chloraprep       Needles:  Injection technique: Single-shot  Needle Type: Echogenic Stimulator Needle          Additional Needles:  Procedures: ultrasound guided (picture in chart) and nerve stimulator Interscalene brachial plexus block  Nerve Stimulator or Paresthesia:  Response: Paresthesia and motor response right shoulder, 0.4 mA, 3.5 cm  Additional Responses:   Narrative:  Start time: 06/02/2013 7:27 AM End time: 06/02/2013 7:38 AM Injection  made incrementally with aspirations every 5 mL.  Performed by: Personally  Anesthesiologist: M. Royce Macadamia, MD, A. Hodierne, MD

## 2013-06-02 NOTE — Progress Notes (Signed)
Utilization review completed.  

## 2013-06-03 MED ORDER — DIAZEPAM 5 MG PO TABS: 2.5000 mg | ORAL_TABLET | Freq: Four times a day (QID) | ORAL | Status: DC | PRN

## 2013-06-03 MED ORDER — OXYCODONE-ACETAMINOPHEN 5-325 MG PO TABS: 1.0000 | ORAL_TABLET | ORAL | Status: DC | PRN

## 2013-06-03 NOTE — Discharge Summary (Signed)
PATIENT ID:      Brent [name]  MRN:     [id number] DOB/AGE:    [birth year] / 80 y.o.     DISCHARGE SUMMARY  ADMISSION DATE:    06/02/2013 DISCHARGE DATE:    ADMISSION DIAGNOSIS: OA RIGHT SHOULDER Past Medical History  Diagnosis Date  . CAD (coronary artery disease)     Status post CABG  . Hyperlipidemia     takes Crestor daily  . Back pain   . Erectile dysfunction   . History of blood clots     to the lungs   . Pulmonary embolism   . GERD (gastroesophageal reflux disease)     takes Omeprazole daily  . Dizziness     takes Antivert daily  . HTN (hypertension)     takes Amlodipine and Micardis daily  . PONV (postoperative nausea and vomiting)     occ  . Myocardial infarction   . Arthritis     DISCHARGE DIAGNOSIS:   Active Problems:   S/P shoulder replacement   PROCEDURE: Procedure(s): RIGHT TOTAL SHOULDER ARTHROPLASTY on 06/02/2013  CONSULTS:   none  HISTORY:  See H&P in chart.  HOSPITAL COURSE:  Brent [name] is a 80 y.o. admitted on 06/02/2013 with a chief complaint of right shoulder pain and dysfunction, and found to have a diagnosis of OA RIGHT SHOULDER.  They were brought to the operating room on 06/02/2013 and underwent Procedure(s): RIGHT TOTAL SHOULDER ARTHROPLASTY.    They were given perioperative antibiotics: Anti-infectives   Start     Dose/Rate Route Frequency Ordered Stop   06/02/13 1400  ceFAZolin (ANCEF) IVPB 2 g/50 mL premix     2 g 100 mL/hr over 30 Minutes Intravenous Every 6 hours 06/02/13 1348 06/03/13 0126   06/02/13 0600  ceFAZolin (ANCEF) IVPB 2 g/50 mL premix     2 g 100 mL/hr over 30 Minutes Intravenous On call to O.R. 06/01/13 1417 06/02/13 0820    .  Patient underwent the above named procedure and tolerated it well. The following day they were hemodynamically stable and pain was controlled on oral analgesics. They were neurovascularly intact to the operative extremity. OT was ordered and worked with patient per protocol. They were medically  and orthopaedically stable for discharge on day 1.  DIAGNOSTIC STUDIES:  RECENT RADIOGRAPHIC STUDIES :  Dg Chest 2 View  05/31/2013   CLINICAL DATA:  Hypertension  EXAM: CHEST  2 VIEW  COMPARISON:  06/24/2011  FINDINGS: Cardiac shadow is at the upper limits of normal in size. Postsurgical changes are again seen. Mild interstitial changes are noted throughout both lungs without focal confluent infiltrate or sizable effusion. No acute bony abnormality is seen.  IMPRESSION: Chronic changes without acute abnormality.   Electronically Signed   By: Inez Catalina M.D.   On: 05/31/2013 10:42   Dg Chest Portable 1 View  06/02/2013   CLINICAL DATA:  Difficulty breathing. Decreased breath sounds on the left.  EXAM: PORTABLE CHEST - 1 VIEW  COMPARISON:  DG CHEST 2 VIEW dated 05/31/2013  FINDINGS: Endotracheal tube as a placed and is 5 cm above the carina. Changes of prior CABG. Decreasing lung volumes with increasing left perihilar and lower lobe airspace opacities. This could reflect atelectasis or infiltrate. Cannot exclude aspiration. No pneumothorax or effusion. Heart is mildly enlarged.  IMPRESSION: Decreasing lung volumes. Increasing left perihilar and lower lobe atelectasis or infiltrate. Cannot exclude aspiration.  Endotracheal tube 5 cm above the carina.  These results were called  by telephone at the time of interpretation on 06/02/2013 at 8:21 AM to Dr. Lennette Bihari SUPPLE , who verbally acknowledged these results.   Electronically Signed   By: Rolm Baptise M.D.   On: 06/02/2013 08:21    RECENT VITAL SIGNS:  Patient Vitals for the past 24 hrs:  BP Temp Temp src Pulse Resp SpO2  06/03/13 0516 122/52 mmHg 97.8 F (36.6 C) Oral 50 18 100 %  06/03/13 0131 144/54 mmHg 98 F (36.7 C) Oral 57 20 97 %  06/02/13 1955 144/52 mmHg 97.8 F (36.6 C) Oral 58 20 98 %  06/02/13 1644 126/52 mmHg 98 F (36.7 C) Oral 58 18 97 %  06/02/13 1340 134/58 mmHg 97.5 F (36.4 C) Oral 53 16 94 %  06/02/13 1315 - 97 F (36.1 C) - 59 17  97 %  06/02/13 1311 130/49 mmHg - - - - -  06/02/13 1230 - - - 51 13 98 %  06/02/13 1226 128/49 mmHg - - - - -  06/02/13 1215 - - - 52 13 98 %  06/02/13 1211 127/53 mmHg - - - - -  06/02/13 1200 - - - 51 14 98 %  06/02/13 1156 122/53 mmHg - - - - -  06/02/13 1145 - - - 55 14 98 %  06/02/13 1141 126/54 mmHg - - - - -  06/02/13 1130 - - - 54 12 98 %  06/02/13 1126 131/53 mmHg - - - - -  06/02/13 1115 - - - 57 15 98 %  06/02/13 1111 122/48 mmHg - - - - -  06/02/13 1100 - - - 56 14 98 %  06/02/13 1056 125/50 mmHg - - - - -  06/02/13 1045 - - - 59 17 98 %  06/02/13 1041 123/46 mmHg 97.6 F (36.4 C) - - - -  .  RECENT EKG RESULTS:    Orders placed during the hospital encounter of 05/31/13  . EKG 12-LEAD  . EKG 12-LEAD    DISCHARGE INSTRUCTIONS:   Future Appointments Provider Department Dept Phone   06/28/2013 2:15 PM Ria Bush, MD [place] [facility] [phone]      DISCHARGE MEDICATIONS:     Medication List         ALLERGY 25 MG tablet  Generic drug:  diphenhydrAMINE  Take 25 mg by mouth 2 (two) times daily.     amLODipine 5 MG tablet  Commonly known as:  NORVASC  Take 1 tablet (5 mg total) by mouth daily.     aspirin EC 81 MG tablet  Take 81 mg by mouth daily.     diazepam 5 MG tablet  Commonly known as:  VALIUM  Take 0.5-1 tablets (2.5-5 mg total) by mouth every 6 (six) hours as needed for muscle spasms or sedation.     FISH OIL PO  Take 1 capsule by mouth daily.     furosemide 40 MG tablet  Commonly known as:  LASIX  Take 40 mg by mouth 3 (three) times a week. Monday, Wednesday and friday     GLUCOSAMINE 1500 COMPLEX PO  Take 1 tablet by mouth 2 (two) times daily.     meclizine 25 MG tablet  Commonly known as:  ANTIVERT  Take 25 mg by mouth every morning.     MENS 50+ ADVANCED PO  Take 1 tablet by mouth daily.     nitroGLYCERIN 0.4 MG SL tablet  Commonly known as:  NITROSTAT  Place 0.4 mg  under the tongue every 5 (five)  minutes as needed. For chest pain     omeprazole 20 MG capsule  Commonly known as:  PRILOSEC  Take 20 mg by mouth 2 (two) times daily.     oxyCODONE-acetaminophen 5-325 MG per tablet  Commonly known as:  PERCOCET  Take 1-2 tablets by mouth every 4 (four) hours as needed.     potassium chloride 10 MEQ tablet  Commonly known as:  K-DUR  Take 10 mEq by mouth 3 (three) times a week. Monday,wednesday and friday     rosuvastatin 40 MG tablet  Commonly known as:  CRESTOR  Take 1 tablet (40 mg total) by mouth daily.     telmisartan-hydrochlorothiazide 80-25 MG per tablet  Commonly known as:  MICARDIS HCT  Take 1 tablet by mouth daily.     traMADol 50 MG tablet  Commonly known as:  ULTRAM  Take 50 mg by mouth every 6 (six) hours as needed. pain     vitamin C 500 MG tablet  Commonly known as:  ASCORBIC ACID  Take 1,000 mg by mouth daily.        FOLLOW UP VISIT:       Follow-up Information   Follow up with Marin Shutter, MD. (call to be seen in 10-14 days)    Specialty:  Orthopedic Surgery   Contact information:   [address] [place] [address] [phone]       DISCHARGE TO: Home   DISPOSITION: Good  DISCHARGE CONDITION:  Thereasa Parkin Amilyah Nack for Dr. Justice Britain 06/03/2013, 8:09 AM

## 2013-06-03 NOTE — Evaluation (Signed)
Occupational Therapy Evaluation Patient Details Name: Brent [name] MRN: [id number] DOB: 23-Mar-1931 Today's Date: 06/03/2013    History of Present Illness  Brent [name] is a 80 y.o. admitted on 06/02/2013 with a chief complaint of right shoulder pain and dysfunction, and found to have a diagnosis of OA RIGHT SHOULDER.  They were brought to the operating room on 06/02/2013 and underwent Procedure(s): R TSA   Clinical Impression   Pt s/p R TSA.  Educated pt on shoulder protocol, ADL compensatory techniques, and HEP. Also provided pt with handout information.  Pt requires min assist for UB ADLs but reports his wife will be present 24/7 and is independent in assisting.  Pt verbalized understanding of all information.  Pt did very well with ROM during RUE exercises.  No further acute OT needs. Recommend progress rehab of shoulder as recommended by MD at f/u visits. OT signing off.     Follow Up Recommendations  No OT follow up;Supervision - Intermittent    Equipment Recommendations  None recommended by OT    Recommendations for Other Services       Precautions / Restrictions Precautions Precautions: Shoulder Type of Shoulder Precautions: Supple protocol for TSA Shoulder Interventions: Shoulder sling/immobilizer;For comfort (when sleeping) Precaution Booklet Issued: Yes (comment) Precaution Comments: Educated pt on shoulder precautions and ADL techniques. Required Braces or Orthoses: Sling Restrictions Weight Bearing Restrictions: Yes RUE Weight Bearing: Non weight bearing      Mobility Bed Mobility                  Transfers Overall transfer level: Modified independent                    Balance                                            ADL Overall ADL's : Needs assistance/impaired     Grooming: Wash/dry hands;Modified independent   Upper Body Bathing: Minimal assitance;Sitting   Lower Body Bathing: Supervison/ safety;Sit to/from stand    Upper Body Dressing : Minimal assistance;Sitting   Lower Body Dressing: Supervision/safety;Sit to/from stand   Toilet Transfer: Modified Independent;Ambulation;Comfort height toilet   Toileting- Clothing Manipulation and Hygiene: Supervision/safety;Sit to/from stand       Functional mobility during ADLs: Modified independent General ADL Comments: Min assist for UB dressing/bathing due to RUE limitations. Pt reports wife is independent in assisting.  Pt able to don undergarment with supervision only to ensure maintenance of R shoulder precautions.  Provided pt with handout information for HEP and shoulder protocol.  Pt reports concern about being able to perform back peri care during toileting since he typically uses RUE (L UE unable to reach behind). Educated pt on use of tongs to complete toileting hygiene tasks and pt verbalized understanding.      Vision                     Perception     Praxis      Pertinent Vitals/Pain Pt reports 1/10 pain. Cold applied following completion of RUE HEP.     Hand Dominance Left (but also uses right hand for many tasks)   Extremity/Trunk Assessment Upper Extremity Assessment Upper Extremity Assessment: RUE deficits/detail RUE Deficits / Details: Elbow, wrist and hand AROM WFL.  ER to 30 degrees, 50 degrees abduction, and 90 degrees FF.  Communication Communication Communication: No difficulties   Cognition Arousal/Alertness: Awake/alert Behavior During Therapy: WFL for tasks assessed/performed Overall Cognitive Status: Within Functional Limits for tasks assessed                     General Comments       Exercises Exercises: Shoulder     Shoulder Instructions Shoulder Instructions Donning/doffing shirt without moving shoulder: Minimal assistance Method for sponge bathing under operated UE: Minimal assistance Donning/doffing sling/immobilizer: Minimal assistance Correct positioning of  sling/immobilizer: Supervision/safety Pendulum exercises (written home exercise program): Supervision/safety ROM for elbow, wrist and digits of operated UE: Independent Sling wearing schedule (on at all times/off for ADL's): Independent Proper positioning of operated UE when showering: Brent [name] expects to be discharged to:: Private residence Living Arrangements: Spouse/significant other Available Help at Discharge: Family;Available 24 hours/day Type of Home: House Home Access: Stairs to enter [employer] of Steps: 4-5         Bathroom Shower/Tub: Teacher, early years/pre: Handicapped height                Prior Functioning/Environment Level of Independence: Independent             OT Diagnosis:     OT Problem List:     OT Treatment/Interventions:      OT Goals(Current goals can be found in the care plan section) Acute Rehab OT Goals Patient Stated Goal: to regain full use of RUE  OT Frequency:     Barriers to D/C:            Co-evaluation              End of Session Nurse Communication: Mobility status  Activity Tolerance: Patient tolerated treatment well Patient left: in chair;with call bell/phone within reach;with nursing/sitter in room   Time: 0925-1010 OT Time Calculation (min): 45 min Charges:  OT General Charges $OT Visit: 1 Procedure OT Evaluation $Initial OT Evaluation Tier I: 1 Procedure OT Treatments $Self Care/Home Management : 23-37 mins $Therapeutic Exercise: 8-22 mins G-Codes:    Luther Bradley 06-14-13, 10:27 AM 06-14-13 Luther Bradley OTR/L Pager [phone] Office [phone]

## 2013-06-06 ENCOUNTER — Encounter (HOSPITAL_COMMUNITY): Payer: Self-pay | Admitting: Orthopedic Surgery

## 2013-06-28 ENCOUNTER — Ambulatory Visit: Payer: Medicare Other | Admitting: Family Medicine

## 2013-06-29 ENCOUNTER — Other Ambulatory Visit: Payer: Self-pay | Admitting: *Deleted

## 2013-06-29 MED ORDER — AMLODIPINE BESYLATE 5 MG PO TABS: 5.0000 mg | ORAL_TABLET | Freq: Every day | ORAL | Status: DC

## 2013-07-13 ENCOUNTER — Other Ambulatory Visit: Payer: Self-pay

## 2013-07-13 MED ORDER — POTASSIUM CHLORIDE ER 10 MEQ PO TBCR: 10.0000 meq | EXTENDED_RELEASE_TABLET | ORAL | Status: DC

## 2013-08-08 ENCOUNTER — Other Ambulatory Visit: Payer: Self-pay

## 2013-08-08 MED ORDER — AMLODIPINE BESYLATE 5 MG PO TABS: 5.0000 mg | ORAL_TABLET | Freq: Every day | ORAL | Status: DC

## 2013-08-08 MED ORDER — FUROSEMIDE 40 MG PO TABS: 40.0000 mg | ORAL_TABLET | ORAL | Status: DC

## 2013-09-16 ENCOUNTER — Other Ambulatory Visit: Payer: Self-pay

## 2013-09-16 MED ORDER — ROSUVASTATIN CALCIUM 40 MG PO TABS: 40.0000 mg | ORAL_TABLET | Freq: Every day | ORAL | Status: DC

## 2013-09-21 ENCOUNTER — Encounter: Payer: Self-pay | Admitting: Cardiovascular Disease

## 2013-09-21 ENCOUNTER — Ambulatory Visit (INDEPENDENT_AMBULATORY_CARE_PROVIDER_SITE_OTHER): Payer: Medicare HMO | Admitting: Cardiovascular Disease

## 2013-09-21 VITALS — BP 109/80 | HR 51 | Ht 72.0 in | Wt 246.0 lb

## 2013-09-21 DIAGNOSIS — I2510 Atherosclerotic heart disease of native coronary artery without angina pectoris: Secondary | ICD-10-CM

## 2013-09-21 DIAGNOSIS — E785 Hyperlipidemia, unspecified: Secondary | ICD-10-CM

## 2013-09-21 DIAGNOSIS — I10 Essential (primary) hypertension: Secondary | ICD-10-CM

## 2013-09-21 LAB — LIPID PANEL
CHOL/HDL RATIO: 3
Cholesterol: 117 mg/dL (ref 0–200)
HDL: 37.7 mg/dL — ABNORMAL LOW (ref >39.00)
LDL Cholesterol: 52 mg/dL (ref 0–99)
NONHDL: 79.3
Triglycerides: 137 mg/dL (ref 0.0–149.0)
VLDL: 27.4 mg/dL (ref 0.0–40.0)

## 2013-09-21 LAB — HEPATIC FUNCTION PANEL
ALT: 15 U/L (ref 0–53)
AST: 22 U/L (ref 0–37)
Albumin: 3.7 g/dL (ref 3.5–5.2)
Alkaline Phosphatase: 81 U/L (ref 39–117)
BILIRUBIN TOTAL: 0.5 mg/dL (ref 0.2–1.2)
Bilirubin, Direct: 0 mg/dL (ref 0.0–0.3)
Total Protein: 7.3 g/dL (ref 6.0–8.3)

## 2013-09-21 LAB — BASIC METABOLIC PANEL
BUN: 27 mg/dL — AB (ref 6–23)
CHLORIDE: 105 meq/L (ref 96–112)
CO2: 31 mEq/L (ref 19–32)
Calcium: 9.1 mg/dL (ref 8.4–10.5)
Creatinine, Ser: 1.5 mg/dL (ref 0.4–1.5)
GFR: 49.79 mL/min — AB (ref >60.00)
Glucose, Bld: 108 mg/dL — ABNORMAL HIGH (ref 70–99)
Potassium: 4.6 mEq/L (ref 3.5–5.1)
Sodium: 138 mEq/L (ref 135–145)

## 2013-09-21 NOTE — Patient Instructions (Signed)
Your physician recommends that you continue on your current medications as directed. Please refer to the Current Medication list given to you today.  Your physician recommends that you have lab work:  TODAY   Your physician wants you to follow-up in: 1 year with Dr. Acie Fredrickson.  You will receive a reminder letter in the mail two months in advance. If you don't receive a letter, please call our office to schedule the follow-up appointment.

## 2013-09-21 NOTE — Assessment & Plan Note (Signed)
Brent [name] is doing very well. He's not had any episodes of angina. We'll check fasting labs today. Continue with current medications. I will again in one year for followup visit. Encouraged him to continue on a good diet and exercise program.

## 2013-09-21 NOTE — Progress Notes (Signed)
Brent [name] Date of Birth  [birth year] [name] [address]    [place]   [place], [place]  [address]    [place], [place]  [address] [phone]  Fax  [phone]  [phone]  Fax [phone]   Problem List 1. CAD / CABG - 2000 2. HTN 3. Hyperlipidemia  History of Present Illness:  Brent [name] is 80 y.o. gentleman with a hx of CAD/CABG, hyperlipidemia, HTN.  He has not had any chest pain.  He has been having lots of left hip pain. He had a steroid injection which helped quite a bit but it appears that the injection has now worn off.  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  Feb. 24, 2014:  He has been having some dyspnea with exercise.  He has lots of orthopedic problems.  He is exercising some some but not as much as he would like.  Nov. 6 ,2014:  Brent [name] is doing well.  He recently had a laser treatment for an esophageal issue.  No cardiac issues.    Feeling well.   Still working on a [name] .   He has lost 10 lbs.    September 21, 2013:  Brent [name] is doing well.  Has had a right shoulder replacement.   Current Outpatient Prescriptions on File Prior to Visit  Medication Sig Dispense Refill  . amLODipine (NORVASC) 5 MG tablet Take 1 tablet (5 mg total) by mouth daily.  30 tablet  3  . aspirin EC 81 MG tablet Take 81 mg by mouth daily.      . diphenhydrAMINE (ALLERGY) 25 MG tablet Take 25 mg by mouth 2 (two) times daily.      . furosemide (LASIX) 40 MG tablet Take 1 tablet (40 mg total) by mouth 3 (three) times a week. Monday, Wednesday and friday  30 tablet  3  . Glucosamine-Chondroit-Vit C-Mn (GLUCOSAMINE 1500 COMPLEX PO) Take 1 tablet by mouth 2 (two) times daily.      . meclizine (ANTIVERT) 25 MG tablet Take 25 mg by mouth every morning.      . Multiple Vitamins-Minerals (MENS 50+ ADVANCED PO) Take 1 tablet by mouth daily.      . nitroGLYCERIN (NITROSTAT) 0.4 MG SL tablet Place 0.4 mg under the tongue every  5 (five) minutes as needed. For chest pain      . Omega-3 Fatty Acids (FISH OIL PO) Take 1 capsule by mouth daily.       Marland Kitchen omeprazole (PRILOSEC) 20 MG capsule Take 20 mg by mouth 2 (two) times daily.      . potassium chloride (K-DUR) 10 MEQ tablet Take 1 tablet (10 mEq total) by mouth 3 (three) times a week. Monday,wednesday and friday  30 tablet  2  . rosuvastatin (CRESTOR) 40 MG tablet Take 1 tablet (40 mg total) by mouth daily.  90 tablet  2  . telmisartan-hydrochlorothiazide (MICARDIS HCT) 80-25 MG per tablet Take 1 tablet by mouth daily.      . traMADol (ULTRAM) 50 MG tablet Take 50 mg by mouth every 6 (six) hours as needed. pain      . vitamin C (ASCORBIC ACID) 500 MG tablet Take 1,000 mg by mouth daily.       No current facility-administered medications on file prior to visit.    Allergies  Allergen Reactions  . Morphine Itching and Hives  . Morphine And Related  Itch     Past Medical History  Diagnosis Date  . CAD (coronary artery disease)     Status post CABG  . Hyperlipidemia     takes Crestor daily  . Back pain   . Erectile dysfunction   . History of blood clots     to the lungs   . Pulmonary embolism   . GERD (gastroesophageal reflux disease)     takes Omeprazole daily  . Dizziness     takes Antivert daily  . HTN (hypertension)     takes Amlodipine and Micardis daily  . PONV (postoperative nausea and vomiting)     occ  . Myocardial infarction   . Arthritis     Past Surgical History  Procedure Laterality Date  . Coronary artery bypass graft  1998    there are sequential 90% stenosis in the proximal LAD, and the mid and distal LAD is a moderate sized vessel. The mid and first diagonal branch has a 50-60% stenosis.   . Cardiovascular stress test  04-28-2005    EF 53%  . Broken jaw      20+ years ago  . Broken ankle    . Esophagoscopy  06/25/2011    Procedure: ESOPHAGOSCOPY;  Surgeon: Izora Gala, MD;  Location: [facility];  Service: ENT;  Laterality: N/A;   Direct Esophagoscopy  . Tonsillectomy    . Total shoulder arthroplasty Right 06/02/2013    Procedure: RIGHT TOTAL SHOULDER ARTHROPLASTY;  Surgeon: Marin Shutter, MD;  Location: [facility];  Service: Orthopedics;  Laterality: Right;    History  Smoking status  . Former Smoker -- 1.00 packs/day for 20 years  . Types: Cigarettes  . Quit date: 06/01/1983  Smokeless tobacco  . Not on file    History  Alcohol Use  . 0.6 oz/week  . 1 Glasses of wine per week    Comment: occ    No family history on file.  Reviw of Systems:  Reviewed in the HPI.  All other systems are negative.  Physical Exam: BP 109/80  Pulse 51  Ht 6' (1.829 m)  Wt 246 lb (111.585 kg)  BMI 33.36 kg/m2 The patient is alert and oriented x 3.  The mood and affect are normal.   Skin: warm and dry.  Color is normal.    HEENT:   Normocephalic/atraumatic. His normal carotids. There is no JVD. His neck is supple.  Lungs: fine rales in the bases bilaterally   Heart: Regular rate S1-S2    Abdomen: Good bowel sounds. No hepatosplenomegaly  Extremities:  No clubbing cyanosis or edema.  Neuro:  Neuro exam is nonfocal.    ECG: September 21, 2013:  Sinus brady at 45 with 1st degree AV block., Inf. MI    Assessment / Plan:

## 2013-11-14 ENCOUNTER — Other Ambulatory Visit: Payer: Self-pay | Admitting: Cardiovascular Disease

## 2013-11-21 ENCOUNTER — Other Ambulatory Visit: Payer: Self-pay

## 2013-11-21 MED ORDER — TELMISARTAN-HCTZ 80-25 MG PO TABS: 1.0000 | ORAL_TABLET | Freq: Every day | ORAL | Status: DC

## 2014-01-30 ENCOUNTER — Other Ambulatory Visit: Payer: Self-pay | Admitting: Physician Assistant

## 2014-01-30 DIAGNOSIS — I714 Abdominal aortic aneurysm, without rupture, unspecified: Secondary | ICD-10-CM

## 2014-01-31 ENCOUNTER — Ambulatory Visit (HOSPITAL_COMMUNITY)
Admission: RE | Admit: 2014-01-31 | Discharge: 2014-01-31 | Disposition: A | Discharge status: Home / Self Care | Payer: Medicare HMO | Source: Ambulatory Visit | Attending: Orthopedic Surgery | Admitting: Orthopedic Surgery

## 2014-01-31 ENCOUNTER — Other Ambulatory Visit (HOSPITAL_COMMUNITY): Payer: Self-pay | Admitting: Orthopedic Surgery

## 2014-01-31 DIAGNOSIS — G8929 Other chronic pain: Secondary | ICD-10-CM

## 2014-01-31 DIAGNOSIS — M545 Low back pain, unspecified: Secondary | ICD-10-CM

## 2014-02-03 ENCOUNTER — Other Ambulatory Visit: Payer: Self-pay | Admitting: Physician Assistant

## 2014-02-03 ENCOUNTER — Ambulatory Visit
Admission: RE | Admit: 2014-02-03 | Discharge: 2014-02-03 | Disposition: A | Discharge status: Home / Self Care | Payer: Medicare HMO | Source: Ambulatory Visit | Attending: Physician Assistant | Admitting: Physician Assistant

## 2014-02-03 DIAGNOSIS — I714 Abdominal aortic aneurysm, without rupture, unspecified: Secondary | ICD-10-CM

## 2014-02-21 ENCOUNTER — Other Ambulatory Visit: Payer: Self-pay | Admitting: Cardiovascular Disease

## 2014-02-21 MED ORDER — POTASSIUM CHLORIDE ER 10 MEQ PO TBCR: 10.0000 meq | EXTENDED_RELEASE_TABLET | ORAL | Status: DC

## 2014-03-20 ENCOUNTER — Encounter: Payer: Self-pay | Admitting: Vascular Surgery

## 2014-03-21 ENCOUNTER — Ambulatory Visit (INDEPENDENT_AMBULATORY_CARE_PROVIDER_SITE_OTHER): Payer: PPO | Admitting: Vascular Surgery

## 2014-03-21 ENCOUNTER — Encounter: Payer: Self-pay | Admitting: Vascular Surgery

## 2014-03-21 VITALS — BP 113/69 | HR 85 | Resp 16 | Ht 73.25 in | Wt 257.6 lb

## 2014-03-21 DIAGNOSIS — I714 Abdominal aortic aneurysm, without rupture, unspecified: Secondary | ICD-10-CM

## 2014-03-21 NOTE — Addendum Note (Signed)
Addended by: Mena Goes on: 03/21/2014 04:58 PM   Modules accepted: Orders

## 2014-03-21 NOTE — Progress Notes (Signed)
Patient name: Brent [name] MRN: [id number] DOB: [birth year] Sex: male   Referred by: Delfino Lovett  Reason for referral:  Chief Complaint  Patient presents with  . New Evaluation    Referred by Maury Dus  (ultrasound at [facility] 02-03-2014)    . AAA    HISTORY OF PRESENT ILLNESS: The patient is seen today for discussion of abdominal aortic aneurysm. He has a very pleasant 81 year old gentleman who underwent plain film of his back for evaluation of back pain. Showed calcification and configuration consistent with aneurysm. He subtotally underwent ultrasound of his aorta on 02/03/2014 and I have reviewed his films of this. This does show a foreign half centimeter infrarenal abdominal aortic aneurysm and also ectasia of both iliac arteries and the 1/2-2 cm range. He had no prior knowledge of this and does not have any history of aneurysm in his past. No family history of aneurysm as well. He does have a history of coronary artery disease undergone prior coronary bypass grafting. He is quite active and in good health for 82.  Past Medical History  Diagnosis Date  . CAD (coronary artery disease)     Status post CABG  . Hyperlipidemia     takes Crestor daily  . Back pain   . Erectile dysfunction   . History of blood clots     to the lungs   . Pulmonary embolism   . GERD (gastroesophageal reflux disease)     takes Omeprazole daily  . Dizziness     takes Antivert daily  . HTN (hypertension)     takes Amlodipine and Micardis daily  . PONV (postoperative nausea and vomiting)     occ  . Myocardial infarction   . Arthritis   . AAA (abdominal aortic aneurysm)     Past Surgical History  Procedure Laterality Date  . Coronary artery bypass graft  1998    there are sequential 90% stenosis in the proximal LAD, and the mid and distal LAD is a moderate sized vessel. The mid and first diagonal branch has a 50-60% stenosis.   . Cardiovascular stress test  04-28-2005    EF 53%  . Broken  jaw      20+ years ago  . Broken ankle    . Esophagoscopy  06/25/2011    Procedure: ESOPHAGOSCOPY;  Surgeon: Izora Gala, MD;  Location: [facility];  Service: ENT;  Laterality: N/A;  Direct Esophagoscopy  . Tonsillectomy    . Total shoulder arthroplasty Right 06/02/2013    Procedure: RIGHT TOTAL SHOULDER ARTHROPLASTY;  Surgeon: Marin Shutter, MD;  Location: [facility];  Service: Orthopedics;  Laterality: Right;    History   Social History  . Marital Status: Married    Spouse Name: N/A    Number of Children: N/A  . Years of Education: N/A   Occupational History  . Not on file.   Social History Main Topics  . Smoking status: Former Smoker -- 1.00 packs/day for 20 years    Types: Cigarettes    Quit date: 02/24/1978  . Smokeless tobacco: Not on file  . Alcohol Use: 0.6 oz/week    1 Glasses of wine per week     Comment: occ  . Drug Use: No  . Sexual Activity: Not on file   Other Topics Concern  . Not on file   Social History Narrative    History reviewed. No pertinent family history.  Allergies as of 03/21/2014 - Review Complete 03/21/2014  Allergen Reaction Noted  . Morphine Itching and Hives 09/21/2013  . Morphine and related  03/10/2011    Current Outpatient Prescriptions on File Prior to Visit  Medication Sig Dispense Refill  . amLODipine (NORVASC) 5 MG tablet TAKE 1 TABLET BY MOUTH DAILY 30 tablet 5  . aspirin EC 81 MG tablet Take 81 mg by mouth daily.    . diphenhydrAMINE (ALLERGY) 25 MG tablet Take 25 mg by mouth 2 (two) times daily.    . furosemide (LASIX) 40 MG tablet Take 1 tablet (40 mg total) by mouth 3 (three) times a week. Monday, Wednesday and friday 30 tablet 3  . Glucosamine-Chondroit-Vit C-Mn (GLUCOSAMINE 1500 COMPLEX PO) Take 1 tablet by mouth 2 (two) times daily.    . meclizine (ANTIVERT) 25 MG tablet Take 25 mg by mouth every morning.    . Multiple Vitamins-Minerals (MENS 50+ ADVANCED PO) Take 1 tablet by mouth daily.    . nitroGLYCERIN (NITROSTAT) 0.4 MG SL  tablet Place 0.4 mg under the tongue every 5 (five) minutes as needed. For chest pain    . Omega-3 Fatty Acids (FISH OIL PO) Take 1 capsule by mouth daily.     Marland Kitchen omeprazole (PRILOSEC) 20 MG capsule Take 20 mg by mouth 2 (two) times daily.    . potassium chloride (K-DUR) 10 MEQ tablet Take 1 tablet (10 mEq total) by mouth 3 (three) times a week. Monday,wednesday and friday 30 tablet 2  . rosuvastatin (CRESTOR) 40 MG tablet Take 1 tablet (40 mg total) by mouth daily. 90 tablet 2  . telmisartan-hydrochlorothiazide (MICARDIS HCT) 80-25 MG per tablet Take 1 tablet by mouth daily. 90 tablet 1  . traMADol (ULTRAM) 50 MG tablet Take 50 mg by mouth every 6 (six) hours as needed. pain    . vitamin C (ASCORBIC ACID) 500 MG tablet Take 1,000 mg by mouth daily.     No current facility-administered medications on file prior to visit.     REVIEW OF SYSTEMS:  Positives indicated with an "X"  CARDIOVASCULAR:  [ ]  chest pain   [ ]  chest pressure   [ ]  palpitations   [ ]  orthopnea   [x ] dyspnea on exertion   [ ]  claudication   [ ]  rest pain   x[ x] DVT   [ ]  phlebitis PULMONARY:   [ ]  productive cough   [ ]  asthma   [x ] wheezing NEUROLOGIC:   [ ]  weakness  [ ]  paresthesias  [ ]  aphasia  [ ]  amaurosis  [ ]  dizziness HEMATOLOGIC:   [ ]  bleeding problems   [ ]  clotting disorders MUSCULOSKELETAL:  [ ]  joint pain   [ ]  joint swelling GASTROINTESTINAL: [ ]   blood in stool  [ ]   hematemesis GENITOURINARY:  [ ]   dysuria  [ ]   hematuria PSYCHIATRIC:  [ ]  history of major depression INTEGUMENTARY:  [ ]  rashes  [ ]  ulcers CONSTITUTIONAL:  [ ]  fever   [ ]  chills  PHYSICAL EXAMINATION:  General: The patient is a well-nourished male, in no acute distress. Vital signs are BP 113/69 mmHg  Pulse 85  Resp 16  Ht 6' 1.25" (1.861 m)  Wt 257 lb 9.6 oz (116.847 kg)  BMI 33.74 kg/m2 Pulmonary: There is a good air exchange bilaterally. He does have some wheezing today. Abdomen: Soft and non-tender with normal pitch  bowel sounds. Moderately obese. No aneurysm palpable. Does have a small umbilical hernia Musculoskeletal: There are no major deformities.  There  is no significant extremity pain. Neurologic: No focal weakness or paresthesias are detected, Skin: There are no ulcer or rashes noted. Psychiatric: The patient has normal affect. Cardiovascular: There is a regular rate and rhythm without significant murmur appreciated. Pulse status: 2+ radial 2+ femoral 2+ popliteal 2+ dorsalis pedis pulses bilaterally with no evidence of peripheral aneurysm Carotid arteries without bruits  Ultrasound at [facility] imaging reviewed 4.5 cm abdominal aortic aneurysm noted  Impression and Plan:  A symptomatic 4.5 cm abdominal aortic aneurysm and moderate iliac ectasia. I discussed this at great length with the patient. I explained the recommendation of observation and serial follow-up. Explained normal size of approximately 2 cm and that we would consider elective repair if he reached the 5-5-1/2 cm range. I did explain symptoms of leaking aneurysm he knows to report immediately to the hospital should this occur. Understands need for routine blood pressure control. We will see him in 6 months with CT scan of his abdomen pelvis that time for further evaluation and assuming this shows no significant change would drop back to 6 month interval ultrasound following this.    EARLY, TODD [facility] Office: [phone]

## 2014-03-28 ENCOUNTER — Ambulatory Visit (INDEPENDENT_AMBULATORY_CARE_PROVIDER_SITE_OTHER): Payer: PPO | Admitting: Physician Assistant

## 2014-03-28 ENCOUNTER — Encounter: Payer: Self-pay | Admitting: Physician Assistant

## 2014-03-28 ENCOUNTER — Telehealth: Payer: Self-pay | Admitting: Cardiovascular Disease

## 2014-03-28 VITALS — BP 110/70 | HR 65 | Ht 73.25 in | Wt 257.0 lb

## 2014-03-28 DIAGNOSIS — I10 Essential (primary) hypertension: Secondary | ICD-10-CM

## 2014-03-28 DIAGNOSIS — R42 Dizziness and giddiness: Secondary | ICD-10-CM

## 2014-03-28 DIAGNOSIS — I2510 Atherosclerotic heart disease of native coronary artery without angina pectoris: Secondary | ICD-10-CM

## 2014-03-28 DIAGNOSIS — R0602 Shortness of breath: Secondary | ICD-10-CM

## 2014-03-28 DIAGNOSIS — E785 Hyperlipidemia, unspecified: Secondary | ICD-10-CM

## 2014-03-28 MED ORDER — AMLODIPINE BESYLATE 2.5 MG PO TABS: 5.0000 mg | ORAL_TABLET | Freq: Every day | ORAL | Status: DC

## 2014-03-28 NOTE — Patient Instructions (Signed)
Increase Lasix.  Take Lasix 40 mg every day for 4 days. Take Potassium every time you take Lasix.  Decrease Norvasc (Amlodipine) to 2.5 mg daily.  A new prescription was sent to your pharmacy.   After 4 days, resume Lasix and Potassium on Monday, Wednesday and Friday.  Go to [facility] on [address] today for a chest Xray.  Labs today:  BMET, BNP, CBC, TSH  Labs in 1 week:  BMET.  Your physician has requested that you have a lexiscan myoview. For further information please visit [email or web address]. Please follow instruction sheet, as given.   Schedule follow up with Dr. Liam Rogers or Richardson Dopp, PA-C in 2 weeks.

## 2014-03-28 NOTE — Progress Notes (Addendum)
Cardiology Office Note   Date:  03/28/2014   ID:  Brent [name], DOB January 11, 1932, MRN [id number]  PCP:  Brent Austria, MD  Cardiologist:  Dr. Liam Rogers     Chief Complaint  Patient presents with  . Shortness of Breath  . Coronary Artery Disease     History of Present Illness: Brent [name] is a 81 y.o. male with a hx of CAD s/p CABG 1998, HTN, HL, AAA.  Lumbar spine xray done to FU on DDD demonstrated a AAA.  Abdominal US in 01/2014 demonstrated AAA 4.5 x 4.1 cm.  He is followed by Dr. Donnetta Hutching.  Plan is to repeat imaging in 6 mos.  Last seen by Dr. Liam Rogers in 08/2013.    Over the last several weeks, he has noticed increasing dyspnea.  He notes DOE with mild to moderate activity. He is NYHA 2b.  He denies orthopnea, PND or edema.  His weight is up almost 10 lbs since last seen.  He denies chest pain.  He has noted some interscapular back pain.  This is mainly positional and improves with positional changes.  He denies any jaw or arm pain.  He denies syncope.  He has experienced dizziness, especially after bending over.  This seemed to be worse yesterday.  He notes he had DOE prior to his CABG.     Studies/Reports Reviewed Today:   - Echocardiogram (04/2012):  Mild LVH, EF 40-45%, mod LAE, mild reduced RVSF, mild to mod RAE, PASP 35 mmHg  - Nuclear Stress Test (04/2012):  Normal stress nuclear study.  EF 57%   - Abdominal US (01/2014):  4.5 x 4.1 cm infrarenal abdominal aortic aneurysm.  Recommend followup by abdomen and pelvis CTA in 6 months, and vascular surgery referral/consultation if not already obtained. This recommendation follows ACR consensus guidelines: White Paper of the ACR Incidental Findings Committee II on Vascular Findings. J Am Coll Radiol 2013; 10:789-794.   Past Medical History  Diagnosis Date  . CAD (coronary artery disease)     Status post CABG  . Hyperlipidemia     takes Crestor daily  . Back pain   . Erectile dysfunction   . History of blood  clots     to the lungs   . Pulmonary embolism   . GERD (gastroesophageal reflux disease)     takes Omeprazole daily  . Dizziness     takes Antivert daily  . HTN (hypertension)     takes Amlodipine and Micardis daily  . PONV (postoperative nausea and vomiting)     occ  . Myocardial infarction   . Arthritis   . AAA (abdominal aortic aneurysm)     Past Surgical History  Procedure Laterality Date  . Coronary artery bypass graft  1998    there are sequential 90% stenosis in the proximal LAD, and the mid and distal LAD is a moderate sized vessel. The mid and first diagonal branch has a 50-60% stenosis.   . Cardiovascular stress test  04-28-2005    EF 53%  . Broken jaw      20+ years ago  . Broken ankle    . Esophagoscopy  06/25/2011    Procedure: ESOPHAGOSCOPY;  Surgeon: Izora Gala, MD;  Location: [facility];  Service: ENT;  Laterality: N/A;  Direct Esophagoscopy  . Tonsillectomy    . Total shoulder arthroplasty Right 06/02/2013    Procedure: RIGHT TOTAL SHOULDER ARTHROPLASTY;  Surgeon: Marin Shutter, MD;  Location: [facility];  Service:  Orthopedics;  Laterality: Right;     Current Outpatient Prescriptions  Medication Sig Dispense Refill  . amLODipine (NORVASC) 5 MG tablet TAKE 1 TABLET BY MOUTH DAILY 30 tablet 5  . aspirin EC 81 MG tablet Take 81 mg by mouth daily.    . diphenhydrAMINE (ALLERGY) 25 MG tablet Take 25 mg by mouth 2 (two) times daily.    . furosemide (LASIX) 40 MG tablet Take 1 tablet (40 mg total) by mouth 3 (three) times a week. Monday, Wednesday and friday 30 tablet 3  . Glucosamine-Chondroit-Vit C-Mn (GLUCOSAMINE 1500 COMPLEX PO) Take 1 tablet by mouth 2 (two) times daily.    . meclizine (ANTIVERT) 25 MG tablet Take 25 mg by mouth every morning.    . Multiple Vitamins-Minerals (MENS 50+ ADVANCED PO) Take 1 tablet by mouth daily.    . nitroGLYCERIN (NITROSTAT) 0.4 MG SL tablet Place 0.4 mg under the tongue every 5 (five) minutes as needed. For chest pain    . Omega-3 Fatty  Acids (FISH OIL PO) Take 1 capsule by mouth daily.     Marland Kitchen omeprazole (PRILOSEC) 20 MG capsule Take 20 mg by mouth 2 (two) times daily.    . potassium chloride (K-DUR) 10 MEQ tablet Take 1 tablet (10 mEq total) by mouth 3 (three) times a week. Monday,wednesday and friday 30 tablet 2  . rosuvastatin (CRESTOR) 40 MG tablet Take 1 tablet (40 mg total) by mouth daily. 90 tablet 2  . telmisartan-hydrochlorothiazide (MICARDIS HCT) 80-25 MG per tablet Take 1 tablet by mouth daily. 90 tablet 1  . traMADol (ULTRAM) 50 MG tablet Take 50 mg by mouth every 6 (six) hours as needed. pain    . vitamin C (ASCORBIC ACID) 500 MG tablet Take 1,000 mg by mouth daily.     No current facility-administered medications for this visit.    Allergies:   Morphine and Morphine and related    Social History:  The patient  reports that he quit smoking about 36 years ago. His smoking use included Cigarettes. He has a 20 pack-year smoking history. He does not have any smokeless tobacco history on file. He reports that he drinks about 0.6 oz of alcohol per week. He reports that he does not use illicit drugs.   Family History:  The patient's family history includes Heart attack in his mother; Stroke in his mother.    ROS:  Please see the history of present illness.   Otherwise, review of systems are positive for fatigue, wheezing, back pain.   All other systems are reviewed and negative.    PHYSICAL EXAM: VS:  BP 110/70 mmHg  Pulse 65  Ht 6' 1.25" (1.861 m)  Wt 257 lb (116.574 kg)  BMI 33.66 kg/m2    Wt Readings from Last 3 Encounters:  03/28/14 257 lb (116.574 kg)  03/21/14 257 lb 9.6 oz (116.847 kg)  09/21/13 246 lb (111.585 kg)     GEN: Well nourished, well developed, in no acute distress HEENT: normal Neck: minimally elevated JVD, no masses Cardiac:  Normal S1/S2, RRR; no murmur, no rubs or gallops, no edema  Respiratory:  Bibasilar crackles, no wheezing. GI: soft, nontender, nondistended, + BS MS: no  deformity or atrophy Skin: warm and dry  Neuro:  CNs II-XII intact, Strength and sensation are intact Psych: Normal affect   EKG:  EKG is ordered today.  It demonstrates:   NSR, HR 65, 1st degree AVB, NSSTTW changes, no change from prior tracing.    Recent Labs:  05/31/2013: Hemoglobin 13.2; Platelets 242 09/21/2013: ALT 15; BUN 27*; Creatinine 1.5; Potassium 4.6; Sodium 138    Lipid Panel    Component Value Date/Time   CHOL 117 09/21/2013 0822   TRIG 137.0 09/21/2013 0822   HDL 37.70* 09/21/2013 0822   CHOLHDL 3 09/21/2013 0822   VLDL 27.4 09/21/2013 0822   LDLCALC 52 09/21/2013 0822      ASSESSMENT AND PLAN:  1.  Dyspnea:  His symptoms are somewhat reminiscent of his prior angina.  However, his weight is up 9 lbs and he has basilar rales on exam.  His EF was 40-45% by echo 2 years ago.  I suspect his symptoms are mainly related to volume excess.  However, I cannot rule out ischemia.    -  Increase Lasix and K+ to QD x 4 days, then resume MWF dosing.    -  Labs today:  BMET, BNP, CBC, TSH.    -  Arrange Lexiscan Myoview (he cannot walk b/c of low back pain and radicular symptoms).  If Myoview abnormal, will need to consider LHC.    -  FU BMET in 1 week. 2.  Coronary Artery Disease s/p CABG:  His anginal equivalent previously was dyspnea.  As noted, a myoview will be obtained. Continue ASA, Amlodipine, Crestor, Micardis. 3.  Hypertension:  He notes his BP is lower recently than it has been in the past.  He has been having some dizziness as well.  Decrease Amlodipine to 2.5 mg QD.  4.  Hyperlipidemia:  LDL optimal 08/2013.  Continue Crestor 40 mg QD.   Current medicines are reviewed at length with the patient today.  The patient has concerns regarding medicines.  The following changes have been made:  Decrease Norvasc to 2.5 mg QD, increase Lasix and K+ to QD x 4 days, then resume MWF dosing.   Labs/ tests ordered today include:   Orders Placed This Encounter  Procedures  . DG  Chest 2 View  . Basic metabolic panel  . Brain natriuretic peptide  . CBC with Differential/Platelet  . TSH  . Basic metabolic panel  . Myocardial Perfusion Imaging  . EKG 12-Lead    Disposition:   FU with me or Dr. Liam Rogers  in 2 weeks.     Signed, Versie Starks, MHS 03/28/2014 3:48 PM    [facility] [address], [place], [place]  [address] Phone: [phone]; Fax: [phone]

## 2014-03-28 NOTE — Telephone Encounter (Signed)
Pt c/o Shortness Of Breath: STAT if SOB developed within the last 24 hours or pt is noticeably SOB on the phone  1. Are you currently SOB (can you hear that pt is SOB on the phone)? No 2. How long have you been experiencing SOB?2-3 weeks  3. Are you SOB when sitting or when up moving around? Depends on what he is doing 4.  Are you currently experiencing any other symptoms? Dizzy and has pain between shoulder pain and in his neck.. Having a hard time bending over and when he raises up he gets very dizzy. When he walks he gets tired very quickly.  Please call to discuss//sr

## 2014-03-28 NOTE — Telephone Encounter (Signed)
Pt called because he has been having SOB with mild activity for a few weeks. Yesterday felt that every time he bend down he felt dizzy, and   was SOB. Pt also felt that  his neck and shoulder were tight.  Today he has been fine he was doing some work  outside , bending, but does not feel dizzy. Pt   Continues  To be  SOB. Pt was seen by a V & V specialist, last week  Because  the artery in his abdomen is getting larger. This was found on a Xray a few weeks ago.  Pt had CABG'S 18 years ago. Pt would like to be seen today if possible. Pt is aware that Dr. Acie Fredrickson has openings  on 04/13/14 . Pt states he can't wait that long. Scott weave PA is aware of pt's symptoms. Pt has an appointment to be see today at 3:00 Pm pt is aware.

## 2014-03-29 ENCOUNTER — Ambulatory Visit
Admission: RE | Admit: 2014-03-29 | Discharge: 2014-03-29 | Disposition: A | Discharge status: Home / Self Care | Payer: PPO | Source: Ambulatory Visit | Attending: Physician Assistant | Admitting: Physician Assistant

## 2014-03-29 ENCOUNTER — Telehealth: Payer: Self-pay | Admitting: *Deleted

## 2014-03-29 DIAGNOSIS — R0602 Shortness of breath: Secondary | ICD-10-CM

## 2014-03-29 DIAGNOSIS — I10 Essential (primary) hypertension: Secondary | ICD-10-CM

## 2014-03-29 LAB — CBC WITH DIFFERENTIAL/PLATELET
BASOS ABS: 0 10*3/uL (ref 0.0–0.1)
Basophils Relative: 0.7 % (ref 0.0–3.0)
Eosinophils Absolute: 0.4 10*3/uL (ref 0.0–0.7)
Eosinophils Relative: 5.6 % — ABNORMAL HIGH (ref 0.0–5.0)
HCT: 37.6 % — ABNORMAL LOW (ref 39.0–52.0)
Hemoglobin: 12.6 g/dL — ABNORMAL LOW (ref 13.0–17.0)
Lymphocytes Relative: 35.3 % (ref 12.0–46.0)
Lymphs Abs: 2.5 10*3/uL (ref 0.7–4.0)
MCHC: 33.6 g/dL (ref 30.0–36.0)
MCV: 90.5 fl (ref 78.0–100.0)
MONO ABS: 0.5 10*3/uL (ref 0.1–1.0)
Monocytes Relative: 7.3 % (ref 3.0–12.0)
NEUTROS PCT: 51.1 % (ref 43.0–77.0)
Neutro Abs: 3.6 10*3/uL (ref 1.4–7.7)
PLATELETS: 285 10*3/uL (ref 150.0–400.0)
RBC: 4.16 Mil/uL — AB (ref 4.22–5.81)
RDW: 13.4 % (ref 11.5–15.5)
WBC: 7 10*3/uL (ref 4.0–10.5)

## 2014-03-29 LAB — BASIC METABOLIC PANEL
BUN: 40 mg/dL — ABNORMAL HIGH (ref 6–23)
CO2: 28 mEq/L (ref 19–32)
CREATININE: 1.69 mg/dL — AB (ref 0.40–1.50)
Calcium: 9.1 mg/dL (ref 8.4–10.5)
Chloride: 101 mEq/L (ref 96–112)
GFR: 41.66 mL/min — ABNORMAL LOW (ref >60.00)
Glucose, Bld: 91 mg/dL (ref 70–99)
POTASSIUM: 4.5 meq/L (ref 3.5–5.1)
Sodium: 136 mEq/L (ref 135–145)

## 2014-03-29 LAB — TSH: TSH: 3.86 u[IU]/mL (ref 0.35–4.50)

## 2014-03-29 LAB — BRAIN NATRIURETIC PEPTIDE: Pro B Natriuretic peptide (BNP): 65 pg/mL (ref 0.0–100.0)

## 2014-03-29 NOTE — Telephone Encounter (Signed)
lmptcb to go over cxr and lab results

## 2014-03-30 MED ORDER — DOXYCYCLINE HYCLATE 100 MG PO CAPS: 100.0000 mg | ORAL_CAPSULE | Freq: Two times a day (BID) | ORAL | Status: DC

## 2014-03-30 MED ORDER — FUROSEMIDE 40 MG PO TABS: 40.0000 mg | ORAL_TABLET | ORAL | Status: DC

## 2014-03-30 NOTE — Telephone Encounter (Signed)
pt notified about cxr and lab results with verbal understanding to plan of care. will HOLD lasix x 3 days, then resume lasix at 2 days a week, w/extra lasix if wt up >3 x 1 day. Doxycycline 100 mg bid sent in as well, pt said ok and thank you.

## 2014-03-30 NOTE — Telephone Encounter (Signed)
Follow Up  Pt returning Carol's phone call; please call back and discuss.

## 2014-04-03 ENCOUNTER — Other Ambulatory Visit (INDEPENDENT_AMBULATORY_CARE_PROVIDER_SITE_OTHER): Payer: PPO | Admitting: *Deleted

## 2014-04-03 ENCOUNTER — Telehealth: Payer: Self-pay | Admitting: *Deleted

## 2014-04-03 ENCOUNTER — Ambulatory Visit (HOSPITAL_COMMUNITY): Payer: PPO | Attending: Cardiovascular Disease | Admitting: Radiology

## 2014-04-03 DIAGNOSIS — R0602 Shortness of breath: Secondary | ICD-10-CM

## 2014-04-03 DIAGNOSIS — I10 Essential (primary) hypertension: Secondary | ICD-10-CM

## 2014-04-03 DIAGNOSIS — I2510 Atherosclerotic heart disease of native coronary artery without angina pectoris: Secondary | ICD-10-CM

## 2014-04-03 LAB — BASIC METABOLIC PANEL
BUN: 26 mg/dL — ABNORMAL HIGH (ref 6–23)
CHLORIDE: 104 meq/L (ref 96–112)
CO2: 29 mEq/L (ref 19–32)
Calcium: 9.6 mg/dL (ref 8.4–10.5)
Creatinine, Ser: 1.51 mg/dL — ABNORMAL HIGH (ref 0.40–1.50)
GFR: 47.45 mL/min — AB (ref >60.00)
GLUCOSE: 111 mg/dL — AB (ref 70–99)
POTASSIUM: 4.6 meq/L (ref 3.5–5.1)
Sodium: 141 mEq/L (ref 135–145)

## 2014-04-03 MED ORDER — TECHNETIUM TC 99M SESTAMIBI GENERIC - CARDIOLITE
30.0000 | Freq: Once | INTRAVENOUS | Status: AC | PRN
  Administered 2014-04-03: 30 via INTRAVENOUS

## 2014-04-03 MED ORDER — REGADENOSON 0.4 MG/5ML IV SOLN
0.4000 mg | Freq: Once | INTRAVENOUS | Status: AC
  Administered 2014-04-03: 0.4 mg via INTRAVENOUS

## 2014-04-03 MED ORDER — AMLODIPINE BESYLATE 2.5 MG PO TABS: 2.5000 mg | ORAL_TABLET | Freq: Every day | ORAL | Status: DC

## 2014-04-03 MED ORDER — TECHNETIUM TC 99M SESTAMIBI GENERIC - CARDIOLITE
10.0000 | Freq: Once | INTRAVENOUS | Status: AC | PRN
  Administered 2014-04-03: 10 via INTRAVENOUS

## 2014-04-03 NOTE — Telephone Encounter (Signed)
pt notified about lab results with verbal understanding  

## 2014-04-03 NOTE — Progress Notes (Signed)
[facility] [address] [place], [place] [address] [phone]    Cardiology Nuclear Med Study  Brent [name] is a 81 y.o. male     MRN : [id number]     DOB: [birth year]  Procedure Date: 04/03/2014  Nuclear Med Background Indication for Stress Test:  Evaluation for Ischemia and Graft Patency History:  CAD, MPI 2014 (normal) EF 57% Cardiac Risk Factors: Hypertension  Symptoms:  DOE   Nuclear Pre-Procedure Caffeine/Decaff Intake:  None> 12 hrs NPO After: 7:00pm   Lungs:  clear O2 Sat: 96% on room air. IV 0.9% NS with Angio Cath:  22g  IV Site: L Antecubital x 1, tolerated well IV Started by:  Irven Baltimore, RN  Chest Size (in):  48 Cup Size: n/a  Height: 6\' 1"  (1.854 m)  Weight:  255 lb (115.667 kg)  BMI:  Body mass index is 33.65 kg/(m^2). Tech Comments:  Patient took morning medications except Lasix per patient. Irven Baltimore, RN.    Nuclear Med Study 1 or 2 day study: 1 day  Stress Test Type:  Lexiscan  Reading MD: N/A  Order Authorizing Provider:  Mertie Moores, MD, and Richardson Dopp, PAC  Resting Radionuclide: Technetium 60m Sestamibi  Resting Radionuclide Dose: 11.0 mCi   Stress Radionuclide:  Technetium 63m Sestamibi  Stress Radionuclide Dose: 33.0 mCi           Stress Protocol Rest HR: 57 Stress HR: 73  Rest BP: 130/66 Stress BP: 123/60  Exercise Time (min): n/a METS: n/a   Predicted Max HR: 140 bpm % Max HR: 52.14 bpm Rate Pressure Product: 10147   Dose of Adenosine (mg):  n/a Dose of Lexiscan: 0.4 mg  Dose of Atropine (mg): n/a Dose of Dobutamine: n/a mcg/kg/min (at max HR)  Stress Test Technologist: Glade Lloyd, BS-ES  Nuclear Technologist:  Earl Many, CNMT     Rest Procedure:  Myocardial perfusion imaging was performed at rest 45 minutes following the intravenous administration of Technetium 20m Sestamibi. Rest ECG: Sinus bradycardia rate 57 with first-degree AV block and nonspecific ST-T wave changes.  Stress  Procedure:  The patient received IV Lexiscan 0.4 mg over 15-seconds.  Technetium 33m Sestamibi injected at 30-seconds.  Quantitative spect images were obtained after a 45 minute delay. Stress ECG: No significant change from baseline ECG  QPS Raw Data Images:  Bowel loop attenuation artifact noted obscuring parts of inferior wall distribution. Stress Images:  There is decreased uptake along the apex, distal lateral wall area cannot exclude old infarct. No ischemia identified. Otherwise, homogeneous radiotracer uptake seen at both rest and stress. There is however bowel loop attenuation affecting basal inferior interpretation of rest images. Rest Images:  As above Subtraction (SDS):  Normal Transient Ischemic Dilatation (Normal <1.22):  1.00 Lung/Heart Ratio (Normal <0.45):  0.42  Quantitative Gated Spect Images QGS EDV:  134 ml QGS ESV:  66 ml  Impression Exercise Capacity:  Lexiscan with no exercise. BP Response:  Normal blood pressure response. Clinical Symptoms:  No significant symptoms noted. ECG Impression:  No significant ST segment change suggestive of ischemia. Comparison with Prior Nuclear Study: No images to compare  Overall Impression:  Low risk stress nuclear study With no ischemia identified. Possible apical infarct pattern. Decreased sensitivity secondary to bowel loop attenuation..  LV Ejection Fraction: 51%.  LV Wall Motion:  Septal wall hypokinesis with otherwise normal LV function  Candee Furbish, MD

## 2014-04-04 ENCOUNTER — Encounter: Payer: Self-pay | Admitting: Physician Assistant

## 2014-04-12 ENCOUNTER — Encounter: Payer: Self-pay | Admitting: Cardiovascular Disease

## 2014-04-12 ENCOUNTER — Ambulatory Visit (INDEPENDENT_AMBULATORY_CARE_PROVIDER_SITE_OTHER): Payer: PPO | Admitting: Cardiovascular Disease

## 2014-04-12 VITALS — BP 120/58 | HR 62 | Ht 73.0 in | Wt 257.6 lb

## 2014-04-12 DIAGNOSIS — R0600 Dyspnea, unspecified: Secondary | ICD-10-CM

## 2014-04-12 DIAGNOSIS — I10 Essential (primary) hypertension: Secondary | ICD-10-CM

## 2014-04-12 DIAGNOSIS — R0609 Other forms of dyspnea: Secondary | ICD-10-CM | POA: Insufficient documentation

## 2014-04-12 DIAGNOSIS — I25810 Atherosclerosis of coronary artery bypass graft(s) without angina pectoris: Secondary | ICD-10-CM

## 2014-04-12 DIAGNOSIS — E785 Hyperlipidemia, unspecified: Secondary | ICD-10-CM

## 2014-04-12 LAB — BASIC METABOLIC PANEL
BUN: 35 mg/dL — ABNORMAL HIGH (ref 6–23)
CALCIUM: 9.7 mg/dL (ref 8.4–10.5)
CO2: 27 mEq/L (ref 19–32)
Chloride: 102 mEq/L (ref 96–112)
Creatinine, Ser: 1.49 mg/dL (ref 0.40–1.50)
GFR: 48.18 mL/min — ABNORMAL LOW (ref >60.00)
GLUCOSE: 101 mg/dL — AB (ref 70–99)
Potassium: 4.6 mEq/L (ref 3.5–5.1)
SODIUM: 137 meq/L (ref 135–145)

## 2014-04-12 NOTE — Progress Notes (Signed)
Cardiology Office Note   Date:  04/12/2014   ID:  Brent [name], DOB 17-Jan-1932, MRN [id number]  PCP:  [name], MD  Cardiologist:   Thayer Headings, MD   Chief Complaint  Patient presents with  . Hypertension   1. CAD / CABG - 2000 2. HTN 3. Hyperlipidemia  History of Present Illness:  Brent [name] is 81 y.o. gentleman with a hx of CAD/CABG, hyperlipidemia, HTN. He has not had any chest pain. He has been having lots of left hip pain. He had a steroid injection which helped quite a bit but it appears that the injection has now worn off.  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  Feb. 24, 2014:  He has been having some dyspnea with exercise. He has lots of orthopedic problems. He is exercising some some but not as much as he would like.  Nov. 6 ,2014:  Brent [name] is doing well. He recently had a laser treatment for an esophageal issue. No cardiac issues. Feeling well. Still working on a [name] . He has lost 10 lbs.   September 21, 2013:  Brent [name] is doing well. Has had a right shoulder replacement.   Feb. 17, 2016:  Brent [name] is a 81 y.o. male who presents for follow up of his dyspnea.  He has been getting more short of breath over the past several weeks.  Was having DOE climbing up an incline.  He was seen by Nicki Reaper in clinic. He was started on Lasix and feels much better.  He has decreased the lasix to twice a week.  .  Follow-up Myoview was low risk .Overall Impression: Low risk stress nuclear study With no ischemia identified. Possible apical infarct pattern. Decreased sensitivity secondary to bowel loop attenuation..  LV Ejection Fraction: 51%. LV Wall Motion: Septal wall hypokinesis with otherwise normal LV function  Not having any chest pain  Past Medical History  Diagnosis Date  . CAD (coronary artery disease)     Status post CABG  . Hyperlipidemia     takes Crestor daily  . Back pain   . Erectile dysfunction   . History  of blood clots     to the lungs   . Pulmonary embolism   . GERD (gastroesophageal reflux disease)     takes Omeprazole daily  . Dizziness     takes Antivert daily  . HTN (hypertension)     takes Amlodipine and Micardis daily  . Myocardial infarction   . Arthritis   . AAA (abdominal aortic aneurysm)   . Hx of cardiovascular stress test     Lexiscan Myoview (2/16):  Possible apical infarct, no ischemia, EF 51%; Low Risk    Past Surgical History  Procedure Laterality Date  . Coronary artery bypass graft  1998    there are sequential 90% stenosis in the proximal LAD, and the mid and distal LAD is a moderate sized vessel. The mid and first diagonal branch has a 50-60% stenosis.   . Cardiovascular stress test  04-28-2005    EF 53%  . Broken jaw      20+ years ago  . Broken ankle    . Esophagoscopy  06/25/2011    Procedure: ESOPHAGOSCOPY;  Surgeon: Izora Gala, MD;  Location: [place];  Service: ENT;  Laterality: N/A;  Direct Esophagoscopy  . Tonsillectomy    . Total shoulder arthroplasty Right 06/02/2013    Procedure: RIGHT TOTAL SHOULDER ARTHROPLASTY;  Surgeon: Marin Shutter, MD;  Location: [place];  Service: Orthopedics;  Laterality: Right;     Current Outpatient Prescriptions  Medication Sig Dispense Refill  . amLODipine (NORVASC) 2.5 MG tablet Take 1 tablet (2.5 mg total) by mouth daily.    Marland Kitchen aspirin EC 81 MG tablet Take 81 mg by mouth daily.    . diphenhydrAMINE (ALLERGY) 25 MG tablet Take 25 mg by mouth 2 (two) times daily.    Marland Kitchen doxycycline (VIBRAMYCIN) 100 MG capsule Take 1 capsule (100 mg total) by mouth 2 (two) times daily. 14 capsule 0  . furosemide (LASIX) 40 MG tablet Take 1 tablet (40 mg total) by mouth as directed. Take 2 days a week; extra if wt up 3 lb's x 1 day    . Glucosamine-Chondroit-Vit C-Mn (GLUCOSAMINE 1500 COMPLEX PO) Take 1 tablet by mouth 2 (two) times daily.    . meclizine (ANTIVERT) 25 MG tablet Take 25 mg by mouth every morning.    . Multiple Vitamins-Minerals  (MENS 50+ ADVANCED PO) Take 1 tablet by mouth daily.    . nitroGLYCERIN (NITROSTAT) 0.4 MG SL tablet Place 0.4 mg under the tongue every 5 (five) minutes as needed. For chest pain    . Omega-3 Fatty Acids (FISH OIL PO) Take 1 capsule by mouth daily.     Marland Kitchen omeprazole (PRILOSEC) 20 MG capsule Take 20 mg by mouth 2 (two) times daily.    . potassium chloride (K-DUR) 10 MEQ tablet Take 1 tablet (10 mEq total) by mouth 3 (three) times a week. Monday,wednesday and friday (Patient taking differently: Take 10 mEq by mouth as directed. Twice a week by mouth...) 30 tablet 2  . rosuvastatin (CRESTOR) 40 MG tablet Take 1 tablet (40 mg total) by mouth daily. 90 tablet 2  . telmisartan-hydrochlorothiazide (MICARDIS HCT) 80-25 MG per tablet Take 1 tablet by mouth daily. 90 tablet 1  . traMADol (ULTRAM) 50 MG tablet Take 50 mg by mouth every 6 (six) hours as needed. pain    . vitamin C (ASCORBIC ACID) 500 MG tablet Take 1,000 mg by mouth daily.     No current facility-administered medications for this visit.    Allergies:   Morphine and Morphine and related    Social History:  The patient  reports that he quit smoking about 36 years ago. His smoking use included Cigarettes. He has a 20 pack-year smoking history. He does not have any smokeless tobacco history on file. He reports that he drinks about 0.6 oz of alcohol per week. He reports that he does not use illicit drugs.   Family History:  The patient's family history includes Heart attack in his mother; Stroke in his mother.    ROS:  Please see the history of present illness.    Review of Systems: Constitutional:  denies fever, chills, diaphoresis, appetite change and fatigue.  HEENT: denies photophobia, eye pain, redness, hearing loss, ear pain, congestion, sore throat, rhinorrhea, sneezing, neck pain, neck stiffness and tinnitus.  Respiratory: admits to SOB, DOE, cough, chest tightness, and wheezing.  Cardiovascular: denies chest pain, palpitations  and leg swelling.  Gastrointestinal: denies nausea, vomiting, abdominal pain, diarrhea, constipation, blood in stool.  Genitourinary: denies dysuria, urgency, frequency, hematuria, flank pain and difficulty urinating.  Musculoskeletal: denies  myalgias, back pain, joint swelling, arthralgias and gait problem.   Skin: denies pallor, rash and wound.  Neurological: denies dizziness, seizures, syncope, weakness, light-headedness, numbness and headaches.   Hematological: denies adenopathy, easy bruising, personal or family bleeding history.  Psychiatric/ Behavioral: denies suicidal  ideation, mood changes, confusion, nervousness, sleep disturbance and agitation.       All other systems are reviewed and negative.    PHYSICAL EXAM: VS:  BP 120/58 mmHg  Pulse 62  Ht 6\' 1"  (1.854 m)  Wt 257 lb 9.6 oz (116.847 kg)  BMI 33.99 kg/m2 , BMI Body mass index is 33.99 kg/(m^2). GEN: Well nourished, well developed, in no acute distress HEENT: normal Neck: no JVD, carotid bruits, or masses Cardiac: RRR; no murmurs, rubs, or gallops,no edema  Respiratory:  Bilateral rales  GI: soft, nontender, nondistended, + BS MS: no deformity or atrophy Skin: warm and dry, no rash Neuro:  Strength and sensation are intact Psych: normal   EKG:  EKG is not ordered today. The ekg ordered today demonstrates    Recent Labs: 09/21/2013: ALT 15 03/28/2014: Hemoglobin 12.6*; Platelets 285.0; Pro B Natriuretic peptide (BNP) 65.0; TSH 3.86 04/03/2014: BUN 26*; Creatinine 1.51*; Potassium 4.6; Sodium 141    Lipid Panel    Component Value Date/Time   CHOL 117 09/21/2013 0822   TRIG 137.0 09/21/2013 0822   HDL 37.70* 09/21/2013 0822   CHOLHDL 3 09/21/2013 0822   VLDL 27.4 09/21/2013 0822   LDLCALC 52 09/21/2013 0822      Wt Readings from Last 3 Encounters:  04/12/14 257 lb 9.6 oz (116.847 kg)  04/03/14 255 lb (115.667 kg)  03/28/14 257 lb (116.574 kg)      Other studies Reviewed: Additional studies/  records that were reviewed today include: . Review of the above records demonstrates:    ASSESSMENT AND PLAN:  1. CAD / CABG - 2000 2. HTN 3. Hyperlipidemia 4. Dyspnea:  He still has rales in the bases.   They seemed to clear partially with deep breath.  I've encouraged him to exercise more regularly.  Check labs today myoview was low risk .    Current medicines are reviewed at length with the patient today.  The patient has concerns regarding medicines.  The following changes have been made:  no change   Disposition:   FU with me in 3 months .     Signed, Nahser, Wonda Cheng, MD  04/12/2014 9:45 AM    [facility] [place], [place], [place]  [address] Phone: [phone]; Fax: [phone]

## 2014-04-12 NOTE — Patient Instructions (Signed)
Your physician recommends that you continue on your current medications as directed. Please refer to the Current Medication list given to you today.  Your physician recommends that you have lab work:  TODAY - BMET  Your physician recommends that you schedule a follow-up appointment in: 3 months with Dr. Acie Fredrickson.

## 2014-04-13 ENCOUNTER — Other Ambulatory Visit: Payer: Self-pay

## 2014-04-13 MED ORDER — NITROGLYCERIN 0.4 MG SL SUBL: 0.4000 mg | SUBLINGUAL_TABLET | SUBLINGUAL | Status: DC | PRN

## 2014-05-25 ENCOUNTER — Other Ambulatory Visit: Payer: Self-pay | Admitting: Cardiovascular Disease

## 2014-07-11 ENCOUNTER — Other Ambulatory Visit: Payer: Self-pay | Admitting: Cardiovascular Disease

## 2014-07-12 ENCOUNTER — Other Ambulatory Visit: Payer: Self-pay | Admitting: Cardiovascular Disease

## 2014-07-14 ENCOUNTER — Encounter: Payer: Self-pay | Admitting: Cardiovascular Disease

## 2014-07-14 ENCOUNTER — Ambulatory Visit (INDEPENDENT_AMBULATORY_CARE_PROVIDER_SITE_OTHER): Payer: PPO | Admitting: Cardiovascular Disease

## 2014-07-14 VITALS — BP 128/72 | HR 62 | Ht 73.0 in | Wt 258.6 lb

## 2014-07-14 DIAGNOSIS — I10 Essential (primary) hypertension: Secondary | ICD-10-CM

## 2014-07-14 DIAGNOSIS — I2510 Atherosclerotic heart disease of native coronary artery without angina pectoris: Secondary | ICD-10-CM | POA: Diagnosis not present

## 2014-07-14 DIAGNOSIS — E785 Hyperlipidemia, unspecified: Secondary | ICD-10-CM | POA: Diagnosis not present

## 2014-07-14 LAB — HEPATIC FUNCTION PANEL
ALT: 14 U/L (ref 0–53)
AST: 20 U/L (ref 0–37)
Albumin: 3.9 g/dL (ref 3.5–5.2)
Alkaline Phosphatase: 81 U/L (ref 39–117)
BILIRUBIN DIRECT: 0.1 mg/dL (ref 0.0–0.3)
BILIRUBIN TOTAL: 0.5 mg/dL (ref 0.2–1.2)
TOTAL PROTEIN: 7.3 g/dL (ref 6.0–8.3)

## 2014-07-14 LAB — BASIC METABOLIC PANEL
BUN: 23 mg/dL (ref 6–23)
CHLORIDE: 104 meq/L (ref 96–112)
CO2: 29 mEq/L (ref 19–32)
CREATININE: 1.37 mg/dL (ref 0.40–1.50)
Calcium: 9.3 mg/dL (ref 8.4–10.5)
GFR: 53.05 mL/min — ABNORMAL LOW (ref >60.00)
Glucose, Bld: 100 mg/dL — ABNORMAL HIGH (ref 70–99)
POTASSIUM: 4.5 meq/L (ref 3.5–5.1)
Sodium: 139 mEq/L (ref 135–145)

## 2014-07-14 LAB — LIPID PANEL
CHOL/HDL RATIO: 3
Cholesterol: 128 mg/dL (ref 0–200)
HDL: 38.9 mg/dL — ABNORMAL LOW (ref >39.00)
LDL Cholesterol: 69 mg/dL (ref 0–99)
NONHDL: 89.1
Triglycerides: 103 mg/dL (ref 0.0–149.0)
VLDL: 20.6 mg/dL (ref 0.0–40.0)

## 2014-07-14 NOTE — Patient Instructions (Signed)

## 2014-07-14 NOTE — Progress Notes (Signed)
Cardiology Office Note   Date:  07/14/2014   ID:  Brent SAMES, DOB 1931/03/31, MRN [id number]  PCP:  [name], MD  Cardiologist:   Thayer Headings, MD   Chief Complaint  Patient presents with  . Coronary Artery Disease   1. CAD / CABG - 2000 2. HTN 3. Hyperlipidemia  History of Present Illness:  Brent [name] is 81 y.o. gentleman with a hx of CAD/CABG, hyperlipidemia, HTN. He has not had any chest pain. He has been having lots of left hip pain. He had a steroid injection which helped quite a bit but it appears that the injection has now worn off.  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  Feb. 24, 2014:  He has been having some dyspnea with exercise. He has lots of orthopedic problems. He is exercising some some but not as much as he would like.  Nov. 6 ,2014:  [name] is doing well. He recently had a laser treatment for an esophageal issue. No cardiac issues. Feeling well. Still working on a [name] . He has lost 10 lbs.   September 21, 2013:  [name] is doing well. Has had a right shoulder replacement.  Feb. 17, 2016:  Brent [name] is a 81 y.o. male who presents for follow up of his dyspnea. He has been getting more short of breath over the past several weeks.  Was having DOE climbing up an incline.  He was seen by Nicki Reaper in clinic. He was started on Lasix and feels much better. He has decreased the lasix to twice a week. .  Follow-up Myoview was low risk .Overall Impression: Low risk stress nuclear study With no ischemia identified. Possible apical infarct pattern. Decreased sensitivity secondary to bowel loop attenuation..  LV Ejection Fraction: 51%. LV Wall Motion: Septal wall hypokinesis with otherwise normal LV function  Not having any chest pain   Jul 14, 2014:  Brent [name] is a 81 y.o. male who presents for follow up of his CAD  Having some bronchitis.  No CP . Still working on his antique car. Not exercising  much     Past Medical History  Diagnosis Date  . CAD (coronary artery disease)     Status post CABG  . Hyperlipidemia     takes Crestor daily  . Back pain   . Erectile dysfunction   . History of blood clots     to the lungs   . Pulmonary embolism   . GERD (gastroesophageal reflux disease)     takes Omeprazole daily  . Dizziness     takes Antivert daily  . HTN (hypertension)     takes Amlodipine and Micardis daily  . Myocardial infarction   . Arthritis   . AAA (abdominal aortic aneurysm)   . Hx of cardiovascular stress test     Lexiscan Myoview (2/16):  Possible apical infarct, no ischemia, EF 51%; Low Risk    Past Surgical History  Procedure Laterality Date  . Coronary artery bypass graft  1998    there are sequential 90% stenosis in the proximal LAD, and the mid and distal LAD is a moderate sized vessel. The mid and first diagonal branch has a 50-60% stenosis.   . Cardiovascular stress test  04-28-2005    EF 53%  . Broken jaw      20+ years ago  . Broken ankle    . Esophagoscopy  06/25/2011    Procedure: ESOPHAGOSCOPY;  Surgeon: Izora Gala,  MD;  Location: [place];  Service: ENT;  Laterality: N/A;  Direct Esophagoscopy  . Tonsillectomy    . Total shoulder arthroplasty Right 06/02/2013    Procedure: RIGHT TOTAL SHOULDER ARTHROPLASTY;  Surgeon: Marin Shutter, MD;  Location: [place];  Service: Orthopedics;  Laterality: Right;     Current Outpatient Prescriptions  Medication Sig Dispense Refill  . amLODipine (NORVASC) 2.5 MG tablet Take 1 tablet (2.5 mg total) by mouth daily. 30 tablet 2  . aspirin EC 81 MG tablet Take 81 mg by mouth daily.    . diphenhydrAMINE (ALLERGY) 25 MG tablet Take 25 mg by mouth 2 (two) times daily.    Marland Kitchen doxycycline (VIBRAMYCIN) 100 MG capsule Take 1 capsule (100 mg total) by mouth 2 (two) times daily. 14 capsule 0  . furosemide (LASIX) 40 MG tablet Take 1 tablet (40 mg total) by mouth as directed. Take 2 days a week; extra if wt up 3 lb's x 1 day    .  Glucosamine-Chondroit-Vit C-Mn (GLUCOSAMINE 1500 COMPLEX PO) Take 1 tablet by mouth 2 (two) times daily.    . meclizine (ANTIVERT) 25 MG tablet Take 25 mg by mouth every morning.    . Multiple Vitamins-Minerals (MENS 50+ ADVANCED PO) Take 1 tablet by mouth daily.    . nitroGLYCERIN (NITROSTAT) 0.4 MG SL tablet Place 1 tablet (0.4 mg total) under the tongue every 5 (five) minutes as needed. For chest pain 25 tablet 6  . Omega-3 Fatty Acids (FISH OIL PO) Take 1 capsule by mouth daily.     Marland Kitchen omeprazole (PRILOSEC) 20 MG capsule Take 20 mg by mouth 2 (two) times daily.    . potassium chloride (K-DUR) 10 MEQ tablet Take 1 tablet (10 mEq total) by mouth 3 (three) times a week. Monday,wednesday and friday (Patient taking differently: Take 10 mEq by mouth as directed. Twice a week by mouth...) 30 tablet 2  . rosuvastatin (CRESTOR) 40 MG tablet Take 1 tablet (40 mg total) by mouth daily. 90 tablet 2  . telmisartan-hydrochlorothiazide (MICARDIS HCT) 80-25 MG per tablet TAKE 1 TABLET BY MOUTH ONCE DAILY 90 tablet 1  . traMADol (ULTRAM) 50 MG tablet Take 50 mg by mouth every 6 (six) hours as needed. pain    . vitamin C (ASCORBIC ACID) 500 MG tablet Take 1,000 mg by mouth daily.     No current facility-administered medications for this visit.    Allergies:   Morphine and Morphine and related    Social History:  The patient  reports that he quit smoking about 36 years ago. His smoking use included Cigarettes. He has a 20 pack-year smoking history. He does not have any smokeless tobacco history on file. He reports that he drinks about 0.6 oz of alcohol per week. He reports that he does not use illicit drugs.   Family History:  The patient's family history includes Heart attack in his mother; Stroke in his mother.    ROS:  Please see the history of present illness.    Review of Systems: Constitutional:  denies fever, chills, diaphoresis, appetite change and fatigue.  HEENT: denies photophobia, eye pain,  redness, hearing loss, ear pain, congestion, sore throat, rhinorrhea, sneezing, neck pain, neck stiffness and tinnitus.  Respiratory: denies SOB, DOE, cough, chest tightness, and wheezing.  Cardiovascular: denies chest pain, palpitations and leg swelling.  Gastrointestinal: denies nausea, vomiting, abdominal pain, diarrhea, constipation, blood in stool.  Genitourinary: denies dysuria, urgency, frequency, hematuria, flank pain and difficulty urinating.  Musculoskeletal: denies  myalgias, back pain, joint swelling, arthralgias and gait problem.   Skin: denies pallor, rash and wound.  Neurological: denies dizziness, seizures, syncope, weakness, light-headedness, numbness and headaches.   Hematological: denies adenopathy, easy bruising, personal or family bleeding history.  Psychiatric/ Behavioral: denies suicidal ideation, mood changes, confusion, nervousness, sleep disturbance and agitation.       All other systems are reviewed and negative.    PHYSICAL EXAM: VS:  BP 128/72 mmHg  Pulse 62  Ht 6\' 1"  (1.854 m)  Wt 117.3 kg (258 lb 9.6 oz)  BMI 34.13 kg/m2 , BMI Body mass index is 34.13 kg/(m^2). GEN: Well nourished, well developed, in no acute distress HEENT: normal Neck: no JVD, carotid bruits, or masses Cardiac: RRR; no murmurs, rubs, or gallops,no edema  Respiratory:  clear to auscultation bilaterally, normal work of breathing GI: soft, nontender, nondistended, + BS MS: no deformity or atrophy Skin: warm and dry, no rash Neuro:  Strength and sensation are intact Psych: normal   EKG:  EKG is not ordered today.    Recent Labs: 09/21/2013: ALT 15 03/28/2014: Hemoglobin 12.6*; Platelets 285.0; Pro B Natriuretic peptide (BNP) 65.0; TSH 3.86 04/12/2014: BUN 35*; Creatinine 1.49; Potassium 4.6; Sodium 137    Lipid Panel    Component Value Date/Time   CHOL 117 09/21/2013 0822   TRIG 137.0 09/21/2013 0822   HDL 37.70* 09/21/2013 0822   CHOLHDL 3 09/21/2013 0822   VLDL 27.4  09/21/2013 0822   LDLCALC 52 09/21/2013 0822      Wt Readings from Last 3 Encounters:  07/14/14 117.3 kg (258 lb 9.6 oz)  04/12/14 116.847 kg (257 lb 9.6 oz)  04/03/14 115.667 kg (255 lb)      Other studies Reviewed: Additional studies/ records that were reviewed today include: . Review of the above records demonstrates:    ASSESSMENT AND PLAN:  1. CAD / CABG - 2000 - doing well. No angina.  Continue same meds  2. HTN- BP is ok , continue meds   3. Hyperlipidemia - will check labs today .  Will see in 1 year with repeat fasting labs.    Current medicines are reviewed at length with the patient today.  The patient does not have concerns regarding medicines.  The following changes have been made:  no change  Labs/ tests ordered today include:  No orders of the defined types were placed in this encounter.     Disposition:   FU with me in 1 year     Nahser, Wonda Cheng, MD  07/14/2014 9:20 AM    [facility] [place], [place], [place]  [address] Phone: [phone]; Fax: [phone]   [facility]  [address] [place], [place]  [address] [phone]    Fax [phone]

## 2014-07-15 ENCOUNTER — Other Ambulatory Visit: Payer: Self-pay | Admitting: Cardiovascular Disease

## 2014-07-17 ENCOUNTER — Other Ambulatory Visit: Payer: Self-pay | Admitting: Nurse Practitioner

## 2014-07-17 DIAGNOSIS — I10 Essential (primary) hypertension: Secondary | ICD-10-CM

## 2014-07-17 MED ORDER — POTASSIUM CHLORIDE ER 10 MEQ PO TBCR: 10.0000 meq | EXTENDED_RELEASE_TABLET | ORAL | Status: DC

## 2014-07-17 MED ORDER — FUROSEMIDE 40 MG PO TABS: 40.0000 mg | ORAL_TABLET | ORAL | Status: DC

## 2014-08-10 ENCOUNTER — Other Ambulatory Visit: Payer: Self-pay | Admitting: Family Medicine

## 2014-08-10 ENCOUNTER — Ambulatory Visit
Admission: RE | Admit: 2014-08-10 | Discharge: 2014-08-10 | Disposition: A | Discharge status: Home / Self Care | Payer: PPO | Source: Ambulatory Visit | Attending: Family Medicine | Admitting: Family Medicine

## 2014-08-10 DIAGNOSIS — R059 Cough, unspecified: Secondary | ICD-10-CM

## 2014-08-10 DIAGNOSIS — R05 Cough: Secondary | ICD-10-CM

## 2014-08-22 ENCOUNTER — Other Ambulatory Visit: Payer: Self-pay | Admitting: Family Medicine

## 2014-08-22 DIAGNOSIS — R9389 Abnormal findings on diagnostic imaging of other specified body structures: Secondary | ICD-10-CM

## 2014-08-22 DIAGNOSIS — R059 Cough, unspecified: Secondary | ICD-10-CM

## 2014-08-22 DIAGNOSIS — R05 Cough: Secondary | ICD-10-CM

## 2014-08-23 ENCOUNTER — Ambulatory Visit
Admission: RE | Admit: 2014-08-23 | Discharge: 2014-08-23 | Disposition: A | Discharge status: Home / Self Care | Payer: PPO | Source: Ambulatory Visit | Attending: Family Medicine | Admitting: Family Medicine

## 2014-08-23 DIAGNOSIS — R059 Cough, unspecified: Secondary | ICD-10-CM

## 2014-08-23 DIAGNOSIS — R9389 Abnormal findings on diagnostic imaging of other specified body structures: Secondary | ICD-10-CM

## 2014-08-23 DIAGNOSIS — R05 Cough: Secondary | ICD-10-CM

## 2014-09-11 ENCOUNTER — Ambulatory Visit (INDEPENDENT_AMBULATORY_CARE_PROVIDER_SITE_OTHER): Payer: PPO | Admitting: Internal Medicine

## 2014-09-11 ENCOUNTER — Encounter: Payer: Self-pay | Admitting: Internal Medicine

## 2014-09-11 VITALS — BP 104/60 | HR 73 | Ht 73.0 in | Wt 254.0 lb

## 2014-09-11 DIAGNOSIS — R0600 Dyspnea, unspecified: Secondary | ICD-10-CM

## 2014-09-11 DIAGNOSIS — R058 Other specified cough: Secondary | ICD-10-CM

## 2014-09-11 DIAGNOSIS — R05 Cough: Secondary | ICD-10-CM | POA: Diagnosis not present

## 2014-09-11 DIAGNOSIS — J8410 Pulmonary fibrosis, unspecified: Secondary | ICD-10-CM | POA: Diagnosis not present

## 2014-09-11 MED ORDER — FAMOTIDINE 20 MG PO TABS: ORAL_TABLET | ORAL | Status: DC

## 2014-09-11 NOTE — Progress Notes (Signed)
Subjective:    Patient ID: Brent [name], male    DOB: 05-21-1931,    MRN: [id number]  HPI  77 yowm quit smoking in 1978 with cabg late 1990's  No trouble breathing until around summer 2015 then cough onset x 05/2014 with neg cardiac w/u by nishan> referred 09/11/2014  by [name] with PF on CT 08/23/14       09/11/2014 1st [facility] office visit/ Brent [name]   Chief Complaint  Patient presents with  . Pulmonary Consult    Referred by [name]. Pt c/o cough, wheezing and SOB for the past 2 months. He states he gets SOB with walking 100 yrds. His cough is mainly non prod but will occ produce some clear sputum.   dyspnea first x one year just with exertion indolent onset / minimally progressive then coughing x 2-3 months assoc with hb  And since onset of cough having more sob.   Cough not better with saba/ some better p pred/breo  No obvious day to day or daytime variability or assoc cp or chest tightness, subjective wheeze or overt sinus  symptoms. No unusual exp hx or h/o childhood pna/ asthma or knowledge of premature birth.  Sleeping ok without nocturnal  or early am exacerbation  of respiratory  c/o's or need for noct saba. Also denies any obvious fluctuation of symptoms with weather or environmental changes or other aggravating or alleviating factors except as outlined above   Current Medications, Allergies, Complete Past Medical History, Past Surgical History, Family History, and Social History were reviewed in [employer] record.  ROS  The following are not active complaints unless bolded sore throat, dysphagia, dental problems, itching, sneezing,  nasal congestion or excess/ purulent secretions, ear ache,   fever, chills, sweats, unintended wt loss, classically pleuritic or exertional cp, hemoptysis,  orthopnea pnd or leg swelling, presyncope, palpitations, abdominal pain, anorexia, nausea, vomiting, diarrhea  or change in bowel or bladder habits, change  in stools or urine, dysuria,hematuria,  rash, arthralgias, visual complaints, headache, numbness, weakness or ataxia or problems with walking or coordination,  change in mood/affect or memory.         Review of Systems  Constitutional: Negative for fever, chills, activity change, appetite change and unexpected weight change.  HENT: Negative for congestion, dental problem, postnasal drip, rhinorrhea, sneezing, sore throat, trouble swallowing and voice change.   Eyes: Negative for visual disturbance.  Respiratory: Positive for cough and shortness of breath. Negative for choking.   Cardiovascular: Negative for chest pain and leg swelling.  Gastrointestinal: Negative for nausea, vomiting and abdominal pain.  Genitourinary: Negative for difficulty urinating.       Acid heartburn  Musculoskeletal: Negative for arthralgias.  Skin: Negative for rash.  Psychiatric/Behavioral: Negative for behavioral problems and confusion.       Objective:   Physical Exam  amb hoarse wm nad  Wt Readings from Last 3 Encounters:  09/11/14 254 lb (115.214 kg)  07/14/14 258 lb 9.6 oz (117.3 kg)  04/12/14 257 lb 9.6 oz (116.847 kg)    Vital signs reviewed    HEENT: nl dentition, turbinates, and orophanx. Nl external ear canals without cough reflex   NECK :  without JVD/Nodes/TM/ nl carotid upstrokes bilaterally   LUNGS: no acc muscle use,  Min bibasilar crackles with BV changes bases also bilaterally    CV:  RRR  no s3 or murmur or increase in P2, no edema   ABD:  soft and nontender  with nl excursion in the supine position. No bruits or organomegaly, bowel sounds nl  MS:  warm without deformities, calf tenderness, cyanosis or clubbing - yellow nails "all my life, just like my dad"   SKIN: warm and dry without lesions    NEURO:  alert, approp, no deficits     I personally reviewed images and agree with radiology impression as follows:  HRCT 08/23/14 1. Pulmonary parenchymal pattern of fibrosis  is likely due to usual interstitial pneumonitis (UIP). 2. Enlarged pulmonary arteries, indicative of pulmonary arterial hypertension          Assessment & Plan:

## 2014-09-11 NOTE — Patient Instructions (Addendum)
Try prilosec otc 20mg  x 2  Take 30-60 min before first meal of the day and Pepcid ac (famotidine) 20 mg one @  bedtime until return (over the counter)  GERD (REFLUX)  is an extremely common cause of respiratory symptoms just like yours , many times with no obvious heartburn at all.    It can be treated with medication, but also with lifestyle changes including elevation of the head of your bed (ideally with 6 inch  bed blocks),  Smoking cessation, avoidance of late meals, excessive alcohol, and avoid fatty foods, chocolate, peppermint, colas, red wine, and acidic juices such as orange juice.  NO MINT OR MENTHOL PRODUCTS SO NO COUGH DROPS  USE SUGARLESS CANDY INSTEAD (Jolley ranchers or Stover's or Life Savers) or even ice chips will also do - the key is to swallow to prevent all throat clearing. NO OIL BASED VITAMINS - use powdered substitutes.  Please schedule a follow up office visit in 6 weeks, call sooner if needed

## 2014-09-12 ENCOUNTER — Encounter: Payer: Self-pay | Admitting: Internal Medicine

## 2014-09-12 DIAGNOSIS — R058 Other specified cough: Secondary | ICD-10-CM | POA: Insufficient documentation

## 2014-09-12 DIAGNOSIS — J8410 Pulmonary fibrosis, unspecified: Secondary | ICD-10-CM | POA: Insufficient documentation

## 2014-09-12 DIAGNOSIS — R05 Cough: Secondary | ICD-10-CM | POA: Insufficient documentation

## 2014-09-12 NOTE — Assessment & Plan Note (Signed)
The most common causes of chronic cough in immunocompetent adults include the following: upper airway cough syndrome (UACS), previously referred to as postnasal drip syndrome (PNDS), which is caused by variety of rhinosinus conditions; (2) asthma; (3) GERD; (4) chronic bronchitis from cigarette smoking or other inhaled environmental irritants; (5) nonasthmatic eosinophilic bronchitis; and (6) bronchiectasis.   These conditions, singly or in combination, have accounted for up to 94% of the causes of chronic cough in prospective studies.   Other conditions have constituted no >6% of the causes in prospective studies These have included bronchogenic carcinoma, chronic interstitial pneumonia, sarcoidosis, left ventricular failure, ACEI-induced cough, and aspiration from a condition associated with pharyngeal dysfunction.    Chronic cough is often simultaneously caused by more than one condition. A single cause has been found from 38 to 82% of the time, multiple causes from 18 to 62%. Multiply caused cough has been the result of three diseases up to 42% of the time.       Based on hx and exam, this is most likely: Upper airway cough syndrome, so named because it's frequently impossible to sort out how much is  CR/sinusitis with freq throat clearing (which can be related to primary GERD)   vs  causing  secondary (" extra esophageal")  GERD from wide swings in gastric pressure that occur with throat clearing, often  promoting self use of mint and menthol lozenges that reduce the lower esophageal sphincter tone and exacerbate the problem further in a cyclical fashion.   These are the same pts (now being labeled as having "irritable larynx syndrome" by some cough centers) who not infrequently have a history of having failed to tolerate ace inhibitors,  dry powder inhalers or biphosphonates or report having atypical reflux symptoms that don't respond to standard doses of PPI , and are easily confused as having  aecopd or asthma flares by even experienced allergists/ pulmonologists.   The first step is to maximize acid suppression and   then regroup if the cough persists.  Note cough not reproduced with insp so not likely related to UIP  See instructions for specific recommendations which were reviewed directly with the patient who was given a copy with highlighter outlining the key components.

## 2014-09-12 NOTE — Assessment & Plan Note (Signed)
09/11/2014  Walked RA x 3 laps @ 185 ft each stopped due to End of study, nl pace, no sob or desat    Not limited at this point, encouraged regular walking to monitor for any loss of ex capacity

## 2014-09-12 NOTE — Assessment & Plan Note (Addendum)
HRCT 08/23/14 1. Pulmonary parenchymal pattern of fibrosis is likely due to usual interstitial pneumonitis (UIP). 2. Enlarged pulmonary arteries, indicative of pulmonary arterial Hypertension.  DDx for pulmonary fibrosis  includes idiopathic pulmonary fibrosis, pulmonary fibrosis associated with rheumatologic diseases (which have a relatively benign course in most cases) , adverse effect from  drugs such as chemotherapy or amiodarone exposure, nonspecific interstitial pneumonia which is typically steroid responsive, and chronic hypersensitivity pneumonitis.   In active  smokers Langerhan's Cell  Histiocyctosis (eosinophilic granuomatosis),  DIP,  and Respiratory Bronchiolitis ILD also need to be considered, but this is almost certainly UIP   Use of PPI is associated with improved survival time and with decreased radiologic fibrosis per King's study published in AJRCCM vol 184 p1390.  Dec 2011  This may not be cause and effect, but given how universally unimpressive and expensive  all the other  Drugs developed to date  have been for pf,   rec start  rx ppi / diet/ lifestyle modification and f/u with serial walking sats and lung volumes for now to put more points on the curve / establish firm baseline before considering additional measures.   Total time = 6m review case with pt/ discussion/ counseling/ giving and going over instructions (see avs)

## 2014-09-18 ENCOUNTER — Other Ambulatory Visit: Payer: Self-pay | Admitting: *Deleted

## 2014-09-18 DIAGNOSIS — Z01818 Encounter for other preprocedural examination: Secondary | ICD-10-CM

## 2014-09-18 LAB — BUN: BUN: 34 mg/dL — AB (ref 7–25)

## 2014-09-18 LAB — CREATININE, SERUM: Creat: 2.05 mg/dL — ABNORMAL HIGH (ref 0.70–1.11)

## 2014-09-19 ENCOUNTER — Other Ambulatory Visit: Payer: Self-pay | Admitting: *Deleted

## 2014-09-19 ENCOUNTER — Ambulatory Visit: Payer: PPO | Admitting: Vascular Surgery

## 2014-09-19 ENCOUNTER — Inpatient Hospital Stay: Admission: RE | Admit: 2014-09-19 | Payer: PPO | Source: Ambulatory Visit

## 2014-09-19 DIAGNOSIS — I714 Abdominal aortic aneurysm, without rupture, unspecified: Secondary | ICD-10-CM

## 2014-09-20 ENCOUNTER — Other Ambulatory Visit: Payer: Self-pay | Admitting: *Deleted

## 2014-09-20 MED ORDER — ROSUVASTATIN CALCIUM 40 MG PO TABS: 40.0000 mg | ORAL_TABLET | Freq: Every day | ORAL | Status: DC

## 2014-09-21 ENCOUNTER — Ambulatory Visit
Admission: RE | Admit: 2014-09-21 | Discharge: 2014-09-21 | Disposition: A | Discharge status: Home / Self Care | Payer: PPO | Source: Ambulatory Visit | Attending: Vascular Surgery | Admitting: Vascular Surgery

## 2014-09-21 ENCOUNTER — Encounter: Payer: Self-pay | Admitting: Vascular Surgery

## 2014-09-26 ENCOUNTER — Ambulatory Visit: Payer: PPO | Admitting: Vascular Surgery

## 2014-10-16 ENCOUNTER — Encounter: Payer: Self-pay | Admitting: Vascular Surgery

## 2014-10-17 ENCOUNTER — Ambulatory Visit (INDEPENDENT_AMBULATORY_CARE_PROVIDER_SITE_OTHER): Payer: PPO | Admitting: Vascular Surgery

## 2014-10-17 ENCOUNTER — Encounter: Payer: Self-pay | Admitting: Vascular Surgery

## 2014-10-17 VITALS — BP 104/56 | HR 72 | Temp 97.4°F | Resp 16 | Ht 73.0 in | Wt 251.9 lb

## 2014-10-17 DIAGNOSIS — I714 Abdominal aortic aneurysm, without rupture, unspecified: Secondary | ICD-10-CM

## 2014-10-17 NOTE — Addendum Note (Signed)
Addended by: Dorthula Rue L on: 10/17/2014 04:31 PM   Modules accepted: Orders

## 2014-10-17 NOTE — Progress Notes (Signed)
History of Present Illness:  Patient is a 81 y.o. year old male who presents for re- evaluation of abdominal aortic aneurysm.  The aneurysm is currently 4.5 cm in diameter and moderate iliac ectasia by [place] on 03/21/2014.  The patient denies abdominal pain.  The patient denies back pain.  The patient hadenies family history of AAA.  Other medical problems include hypertension managed with Norvasc, and hyperlipidemia managed with Crestor.  He does take an 81 mg Aspirin daily for cardiac health.  His last physical exam he has a reported cough for the past 3-4 months and is being followed by a pulmonologist.  No fever, chills or SOB.  Past Medical History  Diagnosis Date  . CAD (coronary artery disease)     Status post CABG  . Hyperlipidemia     takes Crestor daily  . Back pain   . Erectile dysfunction   . History of blood clots     to the lungs   . Pulmonary embolism   . GERD (gastroesophageal reflux disease)     takes Omeprazole daily  . Dizziness     takes Antivert daily  . HTN (hypertension)     takes Amlodipine and Micardis daily  . Myocardial infarction   . Arthritis   . AAA (abdominal aortic aneurysm)   . Hx of cardiovascular stress test     Lexiscan Myoview (2/16):  Possible apical infarct, no ischemia, EF 51%; Low Risk    Past Surgical History  Procedure Laterality Date  . Coronary artery bypass graft  1998    there are sequential 90% stenosis in the proximal LAD, and the mid and distal LAD is a moderate sized vessel. The mid and first diagonal branch has a 50-60% stenosis.   . Cardiovascular stress test  04-28-2005    EF 53%  . Broken jaw      20+ years ago  . Broken ankle    . Esophagoscopy  06/25/2011    Procedure: ESOPHAGOSCOPY;  Surgeon: Izora Gala, MD;  Location: [facility];  Service: ENT;  Laterality: N/A;  Direct Esophagoscopy  . Tonsillectomy    . Total shoulder arthroplasty Right 06/02/2013    Procedure: RIGHT TOTAL SHOULDER ARTHROPLASTY;  Surgeon: Marin Shutter,  MD;  Location: [facility];  Service: Orthopedics;  Laterality: Right;     Social History Social History  Substance Use Topics  . Smoking status: Former Smoker -- 1.00 packs/day for 20 years    Types: Cigarettes    Quit date: 02/25/1976  . Smokeless tobacco: Never Used  . Alcohol Use: 0.6 oz/week    1 Glasses of wine per week     Comment: occ    Family History Family History  Problem Relation Age of Onset  . Heart attack Mother   . Stroke Mother   . Breast cancer Mother     Allergies  Allergies  Allergen Reactions  . Morphine Itching and Hives  . Morphine And Related     Itch      Current Outpatient Prescriptions  Medication Sig Dispense Refill  . albuterol (PROAIR HFA) 108 (90 BASE) MCG/ACT inhaler Inhale 2 puffs into the lungs every 6 (six) hours as needed for wheezing or shortness of breath.    Marland Kitchen amLODipine (NORVASC) 2.5 MG tablet Take 1 tablet (2.5 mg total) by mouth daily. 30 tablet 2  . aspirin EC 81 MG tablet Take 81 mg by mouth daily.    . diphenhydrAMINE (ALLERGY) 25 MG tablet  Take 25 mg by mouth 2 (two) times daily.    . famotidine (PEPCID) 20 MG tablet One at bedtime    . Fluticasone Furoate-Vilanterol (BREO ELLIPTA) 100-25 MCG/INH AEPB Inhale 1 puff into the lungs daily.    . furosemide (LASIX) 40 MG tablet Take 1 tablet (40 mg total) by mouth as directed. Take 2 days a week; extra if wt up 3 lb's x 1 day (Patient taking differently: Take 40 mg by mouth as directed. Take 3 days a week; extra if wt up 3 lb's x 1 day) 45 tablet 4  . Glucosamine-Chondroit-Vit C-Mn (GLUCOSAMINE 1500 COMPLEX PO) Take 1 tablet by mouth 2 (two) times daily.    . meclizine (ANTIVERT) 25 MG tablet Take 25 mg by mouth every morning.    . Multiple Vitamins-Minerals (MENS 50+ ADVANCED PO) Take 1 tablet by mouth daily.    . nitroGLYCERIN (NITROSTAT) 0.4 MG SL tablet Place 1 tablet (0.4 mg total) under the tongue every 5 (five) minutes as needed. For chest pain 25 tablet 6  . omeprazole  (PRILOSEC) 20 MG capsule Take 20 mg by mouth 2 (two) times daily.    . potassium chloride (K-DUR) 10 MEQ tablet Take 1 tablet (10 mEq total) by mouth 3 (three) times a week. Monday,wednesday and friday 36 tablet 3  . rosuvastatin (CRESTOR) 40 MG tablet Take 1 tablet (40 mg total) by mouth daily. 90 tablet 2  . telmisartan-hydrochlorothiazide (MICARDIS HCT) 80-25 MG per tablet TAKE 1 TABLET BY MOUTH ONCE DAILY 90 tablet 1  . traMADol (ULTRAM) 50 MG tablet Take 50 mg by mouth every 6 (six) hours as needed. pain    . vitamin C (ASCORBIC ACID) 500 MG tablet Take 1,000 mg by mouth daily.     No current facility-administered medications for this visit.    ROS:   General:  No weight loss, Fever, chills, "slugish and tired easily   HEENT: No recent headaches, no nasal bleeding, no visual changes, no sore throat  Neurologic: Positive dizziness, blackouts, seizures. No recent symptoms of stroke or mini- stroke. No recent episodes of slurred speech, or temporary blindness.  Cardiac: No recent episodes of chest pain/pressure, no shortness of breath at rest.  No shortness of breath with exertion.  Denies history of atrial fibrillation or irregular heartbeat  Vascular: No history of rest pain in feet.  No history of claudication.  No history of non-healing ulcer, No history of DVT   Pulmonary: No home oxygen, no productive cough, no hemoptysis,  No asthma or wheezing, positive cough non productive  Musculoskeletal:  [ x] Arthritis, [ ]  Low back pain,  [ ]  Joint pain  Hematologic:No history of hypercoagulable state.  No history of easy bleeding.  No history of anemia  Gastrointestinal: No hematochezia or melena,  No gastroesophageal reflux, no trouble swallowing  Urinary: [ ]  chronic Kidney disease, [ ]  on HD - [ ]  MWF or [ ]  TTHS, [ ]  Burning with urination, [ ]  Frequent urination, [ ]  Difficulty urinating;   Skin: No rashes  Psychological: No history of anxiety,  No history of  depression   Physical Examination  Filed Vitals:   10/17/14 1350 10/17/14 1354  BP: 122/56 104/56  Pulse: 72   Temp: 97.4 F (36.3 C)   TempSrc: Oral   Resp: 16   Height: 6\' 1"  (1.854 m)   Weight: 251 lb 14.4 oz (114.261 kg)   SpO2: 97%     Body mass index is 33.24 kg/(m^2).  General:  Alert and oriented, no acute distress HEENT: Normal Neck: No bruit or JVD Pulmonary: Clear to auscultation bilaterally Cardiac: Regular Rate and Rhythm without murmur Gastrointestinal: Soft, non-tender, non-distended, no mass, no scars Skin: No rash Extremity Pulses:  2+ radial, brachial, femoral, dorsalis pedis, posterior tibial pulses bilaterally Musculoskeletal: No deformity or edema  Neurologic: Upper and lower extremity motor 5/5 and symmetric  DATA:  [place] 02/2014 4.5 cm and moderate iliac ectasia   CTA abdomin and pelvis: Transverse dimensions of the aneurysm were overestimated by ultrasound and measure maximally 4.1 x 3.5 cm. Dr. Donnetta Hutching measured 3.0 cm aorta throughout and 2.0 cm iliacs bilaterally.  ASSESSMENtTPLAN:  Diffuse artrial megaly Mild  4.1  AAA  on CTA.  He is not at risk for rupture and is asymptomatic.  Dr. Donnetta Hutching explained that he has large artries which are more prone to aneurysm, but he is in stable condition currently.  He will f/u in 2 years for repeat AAA ultrasound.  We recorded hypotension today 113/60 and 104/56.  He has reported being dizzy when he stands up occasionally over the last several days.  We advised him to follow up with his primary care physician for BP monitoring.        Theda Sers, EMMA MAUREEN PA-C [facility]  The patient was seen in conjunction with Dr. Donnetta Hutching  I have examined the patient, reviewed and agree with above. I reviewed the CT images and discussed this with the patient. He does have diffuse arteriomegaly with his suprarenal aorta being in the 3-3 and half centimeter range. He does have some moderate  dilatation of his infrarenal aorta over this. Iliac arteries are also diffusely dilated throughout their course are as are the common femoral arteries. There is no significant aneurysmal change of these arteries. Explained this puts him at less risk with diffuse enlargement. Have recommended that we see him with ultrasound in 2 years for continued follow-up  Curt Jews, MD 10/17/2014 3:26 PM

## 2014-10-23 ENCOUNTER — Encounter: Payer: Self-pay | Admitting: Internal Medicine

## 2014-10-23 ENCOUNTER — Ambulatory Visit (INDEPENDENT_AMBULATORY_CARE_PROVIDER_SITE_OTHER): Payer: PPO | Admitting: Internal Medicine

## 2014-10-23 ENCOUNTER — Ambulatory Visit: Payer: PPO | Admitting: Internal Medicine

## 2014-10-23 VITALS — BP 112/64 | HR 67 | Ht 73.0 in | Wt 249.0 lb

## 2014-10-23 DIAGNOSIS — J8410 Pulmonary fibrosis, unspecified: Secondary | ICD-10-CM

## 2014-10-23 DIAGNOSIS — E669 Obesity, unspecified: Secondary | ICD-10-CM | POA: Diagnosis not present

## 2014-10-23 DIAGNOSIS — R0600 Dyspnea, unspecified: Secondary | ICD-10-CM

## 2014-10-23 MED ORDER — FLUTICASONE FUROATE-VILANTEROL 100-25 MCG/INH IN AEPB: 1.0000 | INHALATION_SPRAY | Freq: Every day | RESPIRATORY_TRACT | Status: DC

## 2014-10-23 MED ORDER — PANTOPRAZOLE SODIUM 40 MG PO TBEC: 40.0000 mg | DELAYED_RELEASE_TABLET | Freq: Every day | ORAL | Status: DC

## 2014-10-23 NOTE — Progress Notes (Signed)
Subjective:    Patient ID: Brent [name], male    DOB: May 18, 1931,    MRN: [id number]    Brief patient profile:  82 yowm quit smoking in 1978 with cabg late 1990's  No trouble breathing until around summer 2015 then cough onset x 05/2014 with neg cardiac w/u by nishan> referred 09/11/2014  by [name] with PF on CT 08/23/14       09/11/2014 1st [facility] office visit/ Brent [name]   Chief Complaint  Patient presents with  . Pulmonary Consult    Referred by [name]. Pt c/o cough, wheezing and SOB for the past 2 months. He states he gets SOB with walking 100 yrds. His cough is mainly non prod but will occ produce some clear sputum.   dyspnea first symptom he noted  x one year just with exertion indolent onset / minimally progressive then coughing x 2-3 months assoc with hb  And since onset of cough having more sob.   Cough not better with saba/ some better p pred/breo rec Try prilosec otc 20mg  x 2  Take 30-60 min before first meal of the day and Pepcid ac (famotidine) 20 mg one @  bedtime until return (over the counter) GERD diet     10/23/2014 f/u ov/Brent [name] re: PF / AB on maint gerd rx  Chief Complaint  Patient presents with  . Follow-up    Pt states his breathing has slightly improved and not coughing as much. No new co's today.    Doe x hills only / not sure breo helping   No obvious day to day or daytime variability or assoc cp or chest tightness, subjective wheeze or overt sinus  symptoms. No unusual exp hx or h/o childhood pna/ asthma or knowledge of premature birth.  Sleeping ok without nocturnal  or early am exacerbation  of respiratory  c/o's or need for noct saba. Also denies any obvious fluctuation of symptoms with weather or environmental changes or other aggravating or alleviating factors except as outlined above   Current Medications, Allergies, Complete Past Medical History, Past Surgical History, Family History, and Social History were reviewed in [facility] record.  ROS  The following are not active complaints unless bolded sore throat, dysphagia, dental problems, itching, sneezing,  nasal congestion or excess/ purulent secretions, ear ache,   fever, chills, sweats, unintended wt loss, classically pleuritic or exertional cp, hemoptysis,  orthopnea pnd or leg swelling, presyncope, palpitations, abdominal pain, anorexia, nausea, vomiting, diarrhea  or change in bowel or bladder habits, change in stools or urine, dysuria,hematuria,  rash, arthralgias, visual complaints, headache, numbness, weakness or ataxia or problems with walking or coordination,  change in mood/affect or memory.              Objective:   Physical Exam  amb  wm nad no longer hoarse  10/23/2014       250   Wt Readings from Last 3 Encounters:  09/11/14 254 lb (115.214 kg)  07/14/14 258 lb 9.6 oz (117.3 kg)  04/12/14 257 lb 9.6 oz (116.847 kg)    Vital signs reviewed    HEENT: nl dentition, turbinates, and orophanx. Nl external ear canals without cough reflex   NECK :  without JVD/Nodes/TM/ nl carotid upstrokes bilaterally   LUNGS: no acc muscle use,  Min bibasilar crackles assoc  with BV changes    CV:  RRR  no s3 or murmur or increase in P2, no edema  ABD:  soft and nontender with nl excursion in the supine position. No bruits or organomegaly, bowel sounds nl  MS:  warm without deformities, calf tenderness, cyanosis or clubbing - yellow nails "all my life, just like my dad"   SKIN: warm and dry without lesions    NEURO:  alert, approp, no deficits     I personally reviewed images and agree with radiology impression as follows:  HRCT 08/23/14 1. Pulmonary parenchymal pattern of fibrosis is likely due to usual interstitial pneumonitis (UIP). 2. Enlarged pulmonary arteries, indicative of pulmonary arterial hypertension          Assessment & Plan:

## 2014-10-23 NOTE — Patient Instructions (Signed)
Try off Breo now to see what difference it makes and restart the breo if condition worsens  Change to Pantoprazole (protonix) 40 mg   Take  30-60 min before first meal of the day and Pepcid (famotidine)  20 mg one @  bedtime until return to office - this is the best way to tell whether stomach acid is contributing to your problem.    Please schedule a follow up office visit in 6 weeks, call sooner if needed with pfts

## 2014-10-24 ENCOUNTER — Telehealth: Payer: Self-pay | Admitting: Internal Medicine

## 2014-10-24 NOTE — Telephone Encounter (Signed)
Patient returned call

## 2014-10-24 NOTE — Telephone Encounter (Signed)
Seems like he's getig confused with names of meds > set up ov with Tammy for med calendar and MMSE same day

## 2014-10-24 NOTE — Telephone Encounter (Signed)
Patient called requesting medication to be sent to pharmacy. Rx was sent on 10/23/14 to [name] to confirm receipt of Rx  Pharmacy confirmed that patient had already picked up the prescription this morning and that patient needed refill on Crestor.  Called and spoke with patient, he did not remember picking up the prescription this morning.  Patient was advised that the pharmacy said he needed refill on Crestor and that he would need to obtain that medication through Dr. [name] office.   Patient says that he will contact Dr. [name] office to obtain refill on Crestor.  Dr. [name] - is there any concern regarding patient not remembering he picked up his medication this morning?  Please advise.

## 2014-10-24 NOTE — Telephone Encounter (Signed)
lmtcb for pt.  

## 2014-10-24 NOTE — Telephone Encounter (Signed)
Per 10/23/14 OV: Patient Instructions       Try off [place] now to see what difference it makes and restart the breo if condition worsens  Change to Pantoprazole (protonix) 40 mg   Take  30-60 min before first meal of the day and Pepcid (famotidine)  20 mg one @  bedtime until return to office - this is the best way to tell whether stomach acid is contributing to your problem.    Please schedule a follow up office visit in 6 weeks, call sooner if needed with pfts   ---  lmomtcb x1

## 2014-10-25 NOTE — Telephone Encounter (Signed)
lmtcb for pt.  

## 2014-10-26 ENCOUNTER — Encounter: Payer: Self-pay | Admitting: Internal Medicine

## 2014-10-26 DIAGNOSIS — E669 Obesity, unspecified: Secondary | ICD-10-CM | POA: Insufficient documentation

## 2014-10-26 NOTE — Assessment & Plan Note (Signed)
-   HRCT 08/23/14  1. Pulmonary parenchymal pattern of fibrosis is likely due to usual interstitial pneumonitis (UIP). 2. Enlarged pulmonary arteries, indicative of pulmonary arterial Hypertension. -09/11/2014  Walked RA x 3 laps @ 185 ft each stopped due to End of study, nl pace, no sob or desat   Symptoms some better on gerd rx  Use of PPI is associated with improved survival time and with decreased radiologic fibrosis per King's study published in AJRCCM vol 184 p1390.  Dec 2011 and also may have other beneficial effects as per the latest review in [employer] vol 193 P8099 Jun 20016.  This may not always be cause and effect, but given how universally unimpressive and expensive  all the other  Drugs developed to day  have been for pf,   rec start  Continue max  ppi / diet/ lifestyle modification and f/u with serial walking sats and lung volumes for now to put more points on the curve / establish firm baseline before considering additional measures.   I had an extended discussion with the patient reviewing all relevant studies completed to date and  lasting 15 to 20 minutes of a 25 minute visit    Each maintenance medication was reviewed in detail including most importantly the difference between maintenance and prns and under what circumstances the prns are to be triggered using an action plan format that is not reflected in the computer generated alphabetically organized AVS.    Please see instructions for details which were reviewed in writing and the patient given a copy highlighting the part that I personally wrote and discussed at today's ov.

## 2014-10-26 NOTE — Assessment & Plan Note (Signed)
Body mass index is 32.86    Lab Results  Component Value Date   TSH 3.86 03/28/2014     Contributing to gerd tendency/ doe/reviewed need  achieve and maintain neg calorie balance > defer f/u primary care including intermittently monitoring thyroid status

## 2014-10-26 NOTE — Telephone Encounter (Signed)
lmtcb X3 for pt. 

## 2014-10-26 NOTE — Assessment & Plan Note (Signed)
Not clear BREO helping here > rec trial off and restart if worse cough or sob

## 2014-10-27 NOTE — Telephone Encounter (Signed)
Left detailed message advising patient that we need to schedule appointment for patient to come in for Med calendar check. Awaiting call back from patient to schedule

## 2014-10-27 NOTE — Telephone Encounter (Signed)
Pt aware of recs.  Nothing further needed. 

## 2014-10-27 NOTE — Telephone Encounter (Signed)
Informed pt that he needed an appt.. Pt is confused on why he needs to see TP when he was told to come back for PFT and ov with MW.  Please advise.  [phone]

## 2014-10-27 NOTE — Telephone Encounter (Signed)
Patient notified that we need to do med calendar and scheduled with TP on 11/14/14 --------------------------------------------------------  Patient says that he is having breathing issues since Dr. [name] took him off the [place]. He says he is coughing a lot and very congested.   Wants to know what Dr. [name] recommends.

## 2014-10-27 NOTE — Telephone Encounter (Signed)
Please have pt read the instructions again:  Try off Breo now to see what difference it makes and restart the breo if condition worsens

## 2014-11-14 ENCOUNTER — Encounter: Payer: Self-pay | Admitting: Adult Health

## 2014-11-14 ENCOUNTER — Other Ambulatory Visit: Payer: PPO

## 2014-11-14 ENCOUNTER — Other Ambulatory Visit (INDEPENDENT_AMBULATORY_CARE_PROVIDER_SITE_OTHER): Payer: PPO

## 2014-11-14 ENCOUNTER — Ambulatory Visit (INDEPENDENT_AMBULATORY_CARE_PROVIDER_SITE_OTHER): Payer: PPO | Admitting: Adult Health

## 2014-11-14 ENCOUNTER — Encounter: Payer: PPO | Admitting: Adult Health

## 2014-11-14 VITALS — BP 120/72 | HR 77 | Temp 98.2°F | Ht 73.0 in | Wt 248.0 lb

## 2014-11-14 DIAGNOSIS — J8410 Pulmonary fibrosis, unspecified: Secondary | ICD-10-CM | POA: Diagnosis not present

## 2014-11-14 DIAGNOSIS — R05 Cough: Secondary | ICD-10-CM | POA: Diagnosis not present

## 2014-11-14 DIAGNOSIS — R058 Other specified cough: Secondary | ICD-10-CM

## 2014-11-14 LAB — BASIC METABOLIC PANEL
BUN: 38 mg/dL — ABNORMAL HIGH (ref 6–23)
CALCIUM: 9.5 mg/dL (ref 8.4–10.5)
CO2: 29 meq/L (ref 19–32)
Chloride: 101 mEq/L (ref 96–112)
Creatinine, Ser: 1.82 mg/dL — ABNORMAL HIGH (ref 0.40–1.50)
GFR: 38.19 mL/min — ABNORMAL LOW (ref >60.00)
GLUCOSE: 118 mg/dL — AB (ref 70–99)
POTASSIUM: 4.8 meq/L (ref 3.5–5.1)
Sodium: 139 mEq/L (ref 135–145)

## 2014-11-14 LAB — RHEUMATOID FACTOR: Rhuematoid fact SerPl-aCnc: <10 IU/mL (ref <=14)

## 2014-11-14 LAB — SEDIMENTATION RATE: Sed Rate: 47 mm/hr — ABNORMAL HIGH (ref 0–22)

## 2014-11-14 MED ORDER — LEVALBUTEROL HCL 0.63 MG/3ML IN NEBU
0.6300 mg | INHALATION_SOLUTION | Freq: Once | RESPIRATORY_TRACT | Status: AC
  Administered 2014-11-14: 0.63 mg via RESPIRATORY_TRACT

## 2014-11-14 NOTE — Patient Instructions (Signed)
Prednisone taper over next week.  Labs today .  Follow up as planned next month for PFT and office visit with Dr. [name]   Continue with GERD diet .  Please contact office for sooner follow up if symptoms do not improve or worsen or seek emergency care

## 2014-11-14 NOTE — Progress Notes (Signed)
Chart and office note reviewed in detail along with available xrays/ labs > agree with a/p as outlined  

## 2014-11-14 NOTE — Progress Notes (Signed)
Subjective:    Patient ID: Brent [name], male    DOB: Mar 19, 1931,    MRN: [id number]    Brief patient profile:  82 yowm quit smoking in 1978 with cabg late 1990's  No trouble breathing until around summer 2015 then cough onset x 05/2014 with neg cardiac w/u by nishan> referred 09/11/2014  by [name] with PF on CT 08/23/14       09/11/2014 1st [facility] office visit/ [facility]   Chief Complaint  Patient presents with  . Pulmonary Consult    Referred by [name]. Pt c/o cough, wheezing and SOB for the past 2 months. He states he gets SOB with walking 100 yrds. His cough is mainly non prod but will occ produce some clear sputum.   dyspnea first symptom he noted  x one year just with exertion indolent onset / minimally progressive then coughing x 2-3 months assoc with hb  And since onset of cough having more sob.   Cough not better with saba/ some better p pred/breo rec Try prilosec otc 20mg  x 2  Take 30-60 min before first meal of the day and Pepcid ac (famotidine) 20 mg one @  bedtime until return (over the counter) GERD diet     10/23/2014 f/u ov/[facility] re: PF / AB on maint gerd rx  Chief Complaint  Patient presents with  . Follow-up    Pt states his breathing has slightly improved and not coughing as much. No new co's today.    Doe x hills only / not sure breo helping  >>d/c BREO   11/14/2014 Acute OV  Complains of 1 day of occasional prod cough with clear mucus, increased SOB and wheezing during evenings. Chest tightness/congestion.  Over last 6 months noticed that his breathing has been going down hill, with more DOE , dry cough and wheezing .  Former smoker 1 PPD x 25 yr . Quit 1979. No occupational exposure . Does work on cars with lots of dust.  No extensive travel . No previous amiodarone use. No hx of RA or Lupus. No known FH of autoimmune disorders.  Denies any fever, sinus pressure/drainage, and nausea or vomitting.   Hx of CAD f/by Dr. Cathie Olden . Hx of CHF with  Echo 2014 -EF 40-45%, PAP 35, RA mod dilation  Myoview 03/2014 low risk.  Works on cars and yard work. 6 months ago could work on hobbies/yard with min dyspnea.  Now gets sob with activiites and has to stop. Walks on flat surfaces okay but inclines and steps very hard.  Says he has OA in hands, back and hip with pain on/off.  Does have reflux often, better on PPI/pepcid.  Last ov with no  No chest pain, edema , fever, discolored mucus or hemoptysis.     Current Medications, Allergies, Complete Past Medical History, Past Surgical History, Family History, and Social History were reviewed in [employer] record.  ROS  The following are not active complaints unless bolded sore throat, dysphagia, dental problems, itching, sneezing,  nasal congestion or excess/ purulent secretions, ear ache,   fever, chills, sweats, unintended wt loss, classically pleuritic or exertional cp, hemoptysis,  orthopnea pnd or leg swelling, presyncope, palpitations, abdominal pain, anorexia, nausea, vomiting, diarrhea  or change in bowel or bladder habits, change in stools or urine, dysuria,hematuria,  rash, arthralgias, visual complaints, headache, numbness, weakness or ataxia or problems with walking or coordination,  change in mood/affect or memory.  Objective:   Physical Exam  amb  wm nad   10/23/2014       250 >248 11/14/2014    Vital signs reviewed    HEENT: nl dentition, turbinates, and orophanx. Nl external ear canals without cough reflex Poor dentition    NECK :  without JVD/Nodes/TM/ nl carotid upstrokes bilaterally No stridor    LUNGS: no acc muscle use,  Bilateral  bibasilar crackles, scattered rhonchi  Speaking in full sentences  CV:  RRR  no s3 or murmur or increase in P2, no edema   ABD:  soft and nontender with nl excursion in the supine position. No bruits or organomegaly, bowel sounds nl  MS:  warm without deformities, calf tenderness, cyanosis or  clubbing - yellow nails "all my life, just like my dad"   SKIN: warm and dry without lesions    NEURO:  alert, approp, no deficits      HRCT 08/23/14 1. Pulmonary parenchymal pattern of fibrosis is likely due to usual interstitial pneumonitis (UIP). 2. Enlarged pulmonary arteries, indicative of pulmonary arterial hypertension          Assessment & Plan:

## 2014-11-14 NOTE — Addendum Note (Signed)
Addended by: Osa Craver on: 11/14/2014 10:24 AM   Modules accepted: Orders

## 2014-11-14 NOTE — Assessment & Plan Note (Signed)
Flare  Cont w/ GERD tx  Use mucinex DM Twice daily  As needed   pred taper

## 2014-11-14 NOTE — Assessment & Plan Note (Signed)
PF ? Flare  He is a former smoker , no other identifiable hx of possible triggers, ie occupational, drug -amio, macrodantin , etc exposure .  He does have hx of CAD and Systolic CHF with most recent echo showing EF 45%. He does not appear volume overloaded.  Will check brief autoimmune labs w/ ANA /ESR RA factor  Steroid challenge  xopenex neb given in office w/ improved aeration  PFT are pending .   Plan  Prednisone taper over next week.  Labs today .  Follow up as planned next month for PFT and office visit with Dr. [name]   Continue with GERD diet .  Please contact office for sooner follow up if symptoms do not improve or worsen or seek emergency care

## 2014-11-15 LAB — ANA: Anti Nuclear Antibody(ANA): NEGATIVE

## 2014-11-17 NOTE — Progress Notes (Signed)
Quick Note:  Called spoke with patient, advised of lab results / recs as stated by TP. Pt verbalized his understanding and denied any questions. ______ 

## 2014-11-18 ENCOUNTER — Other Ambulatory Visit: Payer: Self-pay | Admitting: Cardiovascular Disease

## 2014-11-21 ENCOUNTER — Other Ambulatory Visit: Payer: Self-pay

## 2014-11-21 NOTE — Telephone Encounter (Signed)
Medication Detail      Disp Refills Start End     rosuvastatin (CRESTOR) 40 MG tablet 90 tablet 2 09/20/2014     Sig - Route: Take 1 tablet (40 mg total) by mouth daily. - Oral    E-Prescribing Status: Receipt confirmed by pharmacy (09/20/2014 5:04 PM EDT)     Pharmacy    [facility], [address]

## 2014-11-22 ENCOUNTER — Telehealth: Payer: Self-pay | Admitting: Adult Health

## 2014-11-22 MED ORDER — PREDNISONE 10 MG PO TABS: ORAL_TABLET | ORAL | Status: DC

## 2014-11-22 NOTE — Telephone Encounter (Signed)
Pt returned call - [phone]

## 2014-11-22 NOTE — Telephone Encounter (Signed)
Prednisone 10 mg take  4 each am x 2 days,   2 each am x 2 days,  1 each am x 2 days and stop then ov with all meds in hand to see me

## 2014-11-22 NOTE — Telephone Encounter (Signed)
Called spoke with patient, advised of MW's recommendations as stated below Pt voiced his understanding and denied any questions/concerns Pt is already scheduled to see MW on 10.10.16 w/ PFT and he will keep this appt  Rx sent to verified pharmacy Nothing further needed; will sign off

## 2014-11-22 NOTE — Telephone Encounter (Signed)
Left message for patient to call back  

## 2014-11-22 NOTE — Telephone Encounter (Signed)
Per 11/14/14 OV w/ TP;   Patient Instructions       Prednisone taper over next week.   Labs today .   Follow up as planned next month for PFT and office visit with Dr. [name]    Continue with GERD diet .   Please contact office for sooner follow up if symptoms do not improve or worsen or seek emergency care   --  Spoke w/ pt. He reports his cough is getting worse at night. Last night he started coughing up white, stringy/foamy colored phlem.  No wheezing, no chest tx, no nasal cong, no PND. Pt reports he never got the prednisone and he called the pharm and they never received it. Pt requesting recs. Please advise MW thanks

## 2014-12-04 ENCOUNTER — Ambulatory Visit (INDEPENDENT_AMBULATORY_CARE_PROVIDER_SITE_OTHER): Payer: PPO | Admitting: Internal Medicine

## 2014-12-04 ENCOUNTER — Encounter: Payer: Self-pay | Admitting: Internal Medicine

## 2014-12-04 VITALS — BP 94/62 | HR 85 | Ht 73.0 in | Wt 248.0 lb

## 2014-12-04 DIAGNOSIS — E669 Obesity, unspecified: Secondary | ICD-10-CM | POA: Diagnosis not present

## 2014-12-04 DIAGNOSIS — Z23 Encounter for immunization: Secondary | ICD-10-CM

## 2014-12-04 DIAGNOSIS — J8410 Pulmonary fibrosis, unspecified: Secondary | ICD-10-CM | POA: Diagnosis not present

## 2014-12-04 DIAGNOSIS — J449 Chronic obstructive pulmonary disease, unspecified: Secondary | ICD-10-CM | POA: Insufficient documentation

## 2014-12-04 LAB — PULMONARY FUNCTION TEST
DL/VA % pred: 111 %
DL/VA: 5.27 ml/min/mmHg/L
DLCO UNC % PRED: 53 %
DLCO unc: 19.29 ml/min/mmHg
FEF 25-75 Post: 0.64 L/sec
FEF 25-75 Pre: 0.78 L/sec
FEF2575-%CHANGE-POST: -17 %
FEF2575-%Pred-Post: 29 %
FEF2575-%Pred-Pre: 35 %
FEV1-%Change-Post: -5 %
FEV1-%PRED-POST: 43 %
FEV1-%PRED-PRE: 45 %
FEV1-POST: 1.39 L
FEV1-Pre: 1.47 L
FEV1FVC-%CHANGE-POST: -2 %
FEV1FVC-%Pred-Pre: 91 %
FEV6-%Change-Post: -3 %
FEV6-%PRED-POST: 51 %
FEV6-%PRED-PRE: 52 %
FEV6-PRE: 2.23 L
FEV6-Post: 2.16 L
FEV6FVC-%Change-Post: 0 %
FEV6FVC-%PRED-PRE: 105 %
FEV6FVC-%Pred-Post: 105 %
FVC-%CHANGE-POST: -3 %
FVC-%Pred-Post: 48 %
FVC-%Pred-Pre: 50 %
FVC-Post: 2.18 L
FVC-Pre: 2.26 L
POST FEV6/FVC RATIO: 99 %
Post FEV1/FVC ratio: 64 %
Pre FEV1/FVC ratio: 65 %
Pre FEV6/FVC Ratio: 99 %
RV % PRED: 86 %
RV: 2.45 L
TLC % pred: 62 %
TLC: 4.79 L

## 2014-12-04 MED ORDER — BUDESONIDE-FORMOTEROL FUMARATE 160-4.5 MCG/ACT IN AERO: INHALATION_SPRAY | RESPIRATORY_TRACT | Status: DC

## 2014-12-04 MED ORDER — PREDNISONE 10 MG PO TABS: ORAL_TABLET | ORAL | Status: DC

## 2014-12-04 NOTE — Progress Notes (Signed)
PFT done today. 

## 2014-12-04 NOTE — Progress Notes (Signed)
Subjective:    Patient ID: Brent [name], male    DOB: March 13, 1931,    MRN: [id number]    Brief patient profile:  82 yowm quit smoking in 1978 with cabg late 1990's  No trouble breathing until around summer 2015 then cough onset x 05/2014 with neg cardiac w/u by nishan> referred 09/11/2014  by [name] with PF on CT 08/23/14      History of Present Illness  09/11/2014 1st [facility] office visit/ [facility]   Chief Complaint  Patient presents with  . Pulmonary Consult    Referred by [name]. Pt c/o cough, wheezing and SOB for the past 2 months. He states he gets SOB with walking 100 yrds. His cough is mainly non prod but will occ produce some clear sputum.   dyspnea first symptom he noted  x one year just with exertion indolent onset / minimally progressive then coughing x 2-3 months assoc with hb  And since onset of cough having more sob.   Cough not better with saba/ some better p pred/breo rec Try prilosec otc 32m x 2  Take 30-60 min before first meal of the day and Pepcid ac (famotidine) 20 mg one @  bedtime until return (over the counter) GERD diet     10/23/2014 f/u ov/[facility] re: PF / AB on maint gerd rx  Chief Complaint  Patient presents with  . Follow-up    Pt states his breathing has slightly improved and not coughing as much. No new co's today.    Doe x hills only / not sure breo helping  >>d/c BREO   11/14/2014 Acute OV  Complains of 1 day of occasional prod cough with clear mucus, increased SOB and wheezing during evenings. Chest tightness/congestion.  Over last 6 months noticed that his breathing has been going down hill, with more DOE , dry cough and wheezing .  Former smoker 1 PPD x 25 yr . Quit 1979. No occupational exposure . Does work on cars with lots of dust.  No extensive travel . No previous amiodarone use. No hx of RA or Lupus. No known FH of autoimmune disorders.  Denies any fever, sinus pressure/drainage, and nausea or vomitting.   Hx of CAD f/by Dr.  NCathie Olden. Hx of CHF with Echo 2014 -EF 40-45%, PAP 35, RA mod dilation  Myoview 03/2014 low risk.  Works on cars and yard work. 6 months ago could work on hobbies/yard with min dyspnea.  Now gets sob with activiites and has to stop. Walks on flat surfaces okay but inclines and steps very hard.  Says he has OA in hands, back and hip with pain on/off.  Does have reflux often, better on PPI/pepcid.  rec Prednisone taper over next week.  Labs today .  Follow up as planned next month for PFT and office visit with [name]  Continue with GERD diet .    12/04/2014  f/u ov/[facility] re:  Chief Complaint  Patient presents with  . Follow-up    PFT done today. Pt states cough and SOB are unchanged. No new co's today. He uses albuterol inhaler 3 x per day on average.       mb and back easier while on Pred with ESR 47 > doe = MMRC1 = can walk nl pace, flat grade, can't hurry or go uphills or steps s sob   Cough is worse at hs also better on pred  Some neck pain if bending over but  no sign other arthritic symptoms  No obvious day to day or daytime variability or assoc excess or purulent secretions or cp or chest tightness, subjective wheeze or overt sinus or hb symptoms. No unusual exp hx or h/o childhood pna/ asthma or knowledge of premature birth.  Sleeping ok without nocturnal  or early am exacerbation  of respiratory  c/o's or need for noct saba. Also denies any obvious fluctuation of symptoms with weather or environmental changes or other aggravating or alleviating factors except as outlined above   Current Medications, Allergies, Complete Past Medical History, Past Surgical History, Family History, and Social History were reviewed in [employer] record.  ROS  The following are not active complaints unless bolded sore throat, dysphagia, dental problems, itching, sneezing,  nasal congestion or excess/ purulent secretions, ear ache,   fever, chills, sweats, unintended wt loss,  classically pleuritic or exertional cp, hemoptysis,  orthopnea pnd or leg swelling, presyncope, palpitations, abdominal pain, anorexia, nausea, vomiting, diarrhea  or change in bowel or bladder habits, change in stools or urine, dysuria,hematuria,  rash, arthralgias, visual complaints, headache, numbness, weakness or ataxia or problems with walking or coordination,  change in mood/affect or memory.                 Objective:   Physical Exam  amb  wm nad   10/23/2014       250 >248 11/14/2014 > 12/04/2014    248   Vital signs reviewed    HEENT: nl dentition, turbinates, and orophanx. Nl external ear canals without cough reflex Poor dentition    NECK :  without JVD/Nodes/TM/ nl carotid upstrokes bilaterally No stridor    LUNGS: no acc muscle use,    Bibasilar insp  crackles, scattered rhonchi  Speaking in full sentences  CV:  RRR  no s3 or murmur or increase in P2, no edema   ABD:  soft and nontender with nl excursion in the supine position. No bruits or organomegaly, bowel sounds nl  MS:  warm without deformities, calf tenderness, cyanosis or clubbing - yellow nails "all my life, just like my dad"   SKIN: warm and dry without lesions    NEURO:  alert, approp, no deficits      HRCT 08/23/14 1. Pulmonary parenchymal pattern of fibrosis is likely due to usual interstitial pneumonitis (UIP). 2. Enlarged pulmonary arteries, indicative of pulmonary arterial hypertension          Assessment & Plan:

## 2014-12-04 NOTE — Patient Instructions (Addendum)
Reduce micardis 80 to  one half daily   Prednisone 10 mg Take 4 for three days 3 for three days 2 for three days 1 for three days and stop   symbicort 160 Take 2 puffs first thing in am and then another 2 puffs about 12 hours later - fill it if helping breathing and coughing.   Only use your albuterol as a rescue medication to be used if you can't catch your breath by resting or doing a relaxed purse lip breathing pattern.  - The less you use it, the better it will work when you need it. - Ok to use up to 2 puffs  every 4 hours if you must but call for immediate appointment if use goes up over your usual need - Don't leave home without it !!  (think of it like the spare tire for your car)   Please schedule a follow up office visit in 6 weeks, call sooner if needed

## 2014-12-05 NOTE — Assessment & Plan Note (Addendum)
The proper method of use, as well as anticipated side effects, of a metered-dose inhaler are discussed and demonstrated to the patient. Improved effectiveness after extensive coaching during this visit to a level of approximately  75% so try symbicort 160 2bid and 12 day course of prednisone to explore just how reversible his symptoms are with steroids, then regroup in 6 weeks

## 2014-12-05 NOTE — Assessment & Plan Note (Signed)
-   HRCT 08/23/14  1. Pulmonary parenchymal pattern of fibrosis is likely due to usual interstitial pneumonitis (UIP). 2. Enlarged pulmonary arteries, indicative of pulmonary arterial Hypertension. -09/11/2014  Walked RA x 3 laps @ 185 ft each stopped due to End of study, nl pace, no sob or desat  - PFT's  12/04/2014  FEV1 1.39 (43 % ) ratio 64  p no % improvement from saba with DLCO  53 % corrects to 111 % for alv volume  And ERV 20  He may be a candidate for antifibrotic therapy but does describe def improvement in symptoms on pred and clearly has a mixed obst/restrictive picture >  See copd  In meantime, Use of PPI is associated with improved survival time and with decreased radiologic fibrosis per King's study published in AJRCCM vol 184 p1390.  Dec 2011 and also may have other beneficial effects as per the latest review in [place] vol 193 P7948 Jun 20016.  This may not always be cause and effect, but given how universally unimpressive and expensive  all the other  Drugs developed to day  have been for pf,   rec start  rx ppi / diet/ lifestyle modification and f/u with serial walking sats and lung volumes for now to put more points on the curve / establish firm baseline before considering additional measures.

## 2014-12-05 NOTE — Assessment & Plan Note (Addendum)
PFT's 12/04/2014 with ERV 20 c/w effects of obesity on lung vols  Body mass index is 32.73 - no real change   Lab Results  Component Value Date   TSH 3.86 03/28/2014     Contributing to gerd tendency/ doe/reviewed the need and the process to achieve and maintain neg calorie balance > defer f/u primary care including intermittently monitoring thyroid status

## 2014-12-25 ENCOUNTER — Telehealth: Payer: Self-pay | Admitting: Internal Medicine

## 2014-12-25 MED ORDER — BUDESONIDE-FORMOTEROL FUMARATE 160-4.5 MCG/ACT IN AERO: INHALATION_SPRAY | RESPIRATORY_TRACT | Status: DC

## 2014-12-25 NOTE — Telephone Encounter (Signed)
Spoke with pt. He needs refill on Symbicort. Rx has been sent in. Nothing further was needed.

## 2015-02-23 ENCOUNTER — Other Ambulatory Visit: Payer: Self-pay | Admitting: Internal Medicine

## 2015-02-23 ENCOUNTER — Telehealth: Payer: Self-pay | Admitting: Internal Medicine

## 2015-02-23 MED ORDER — PANTOPRAZOLE SODIUM 40 MG PO TBEC: 40.0000 mg | DELAYED_RELEASE_TABLET | Freq: Every day | ORAL | Status: DC

## 2015-02-23 NOTE — Telephone Encounter (Signed)
Called and with pt. Pt request a refill on pantoprazole verified pharmacy as [facility]. Pt voiced understanding and had no further questions. Rx sent, nothing further needed.

## 2015-04-12 IMAGING — CR DG ORBITS FOR FOREIGN BODY
2 series · 2 of 2 positions shown · non-contrast
Comparison: CT brain of 07/14/2008

CLINICAL DATA: History of working with metal, pre MRI screening

EXAM:
ORBITS FOR FOREIGN BODY - 2 VIEW

[w waters (1 of 2)]
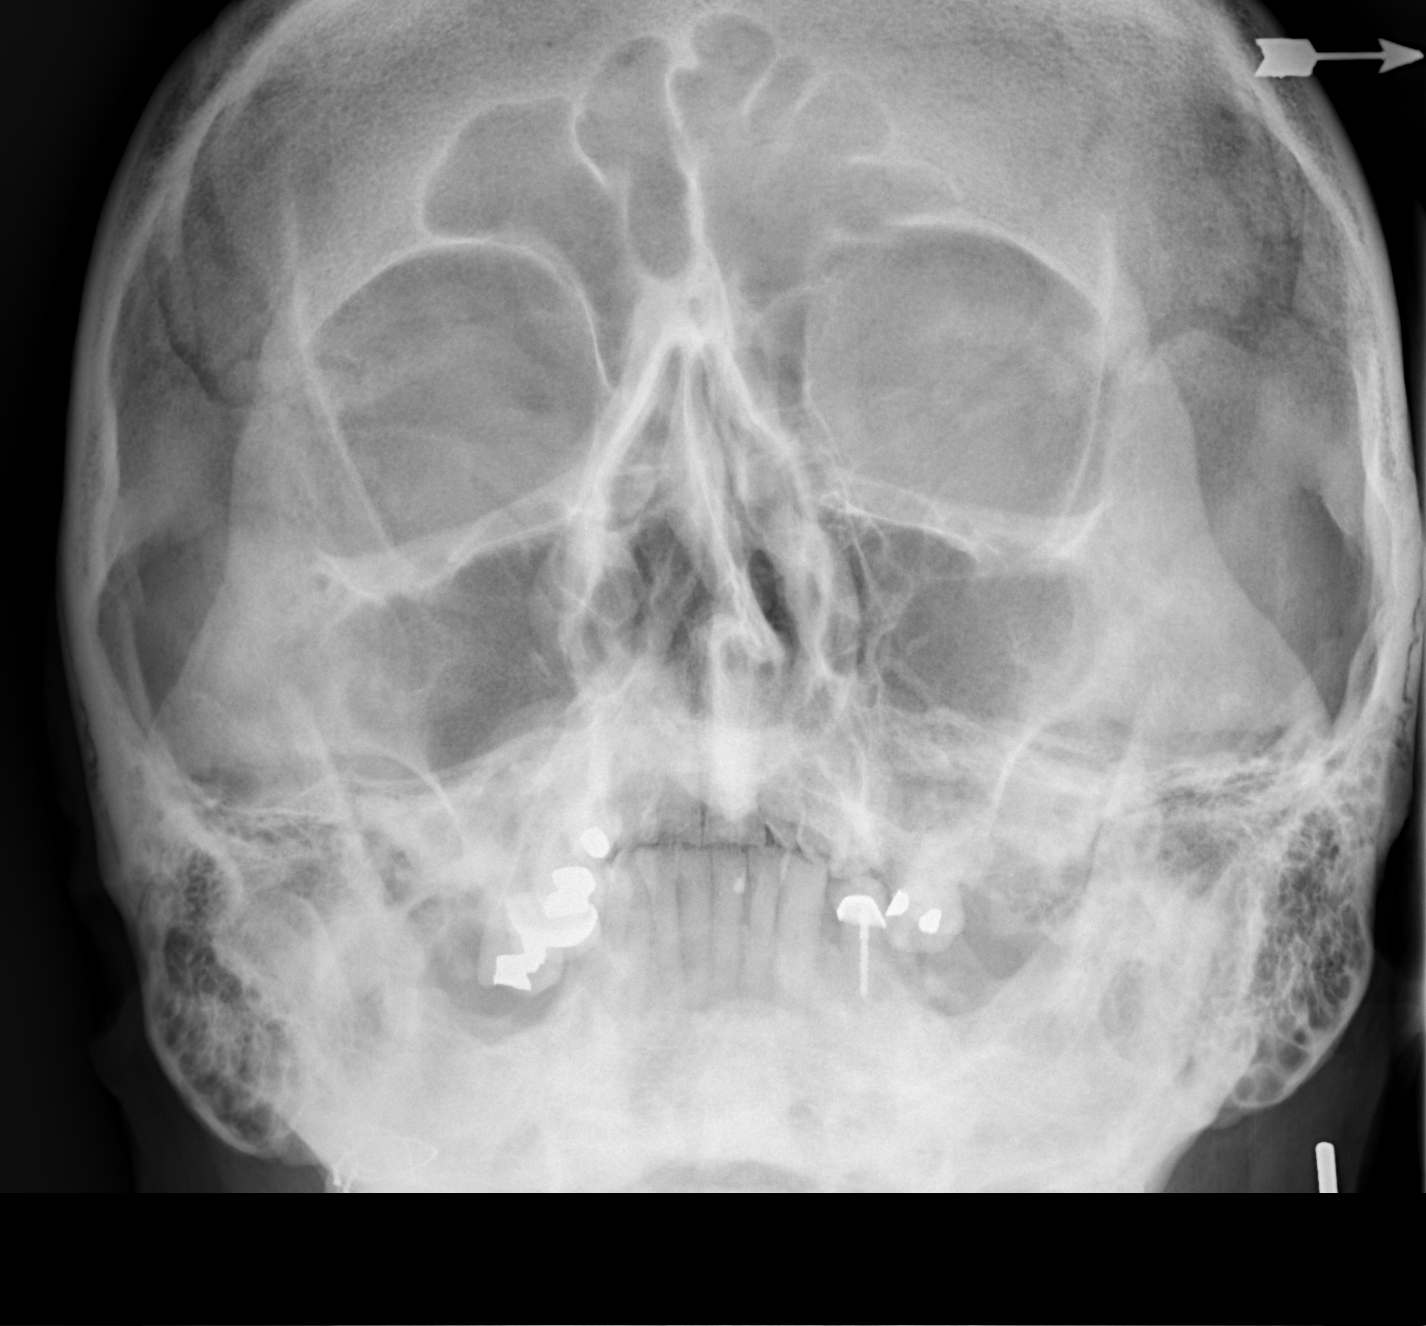

[w waters (2 of 2)]
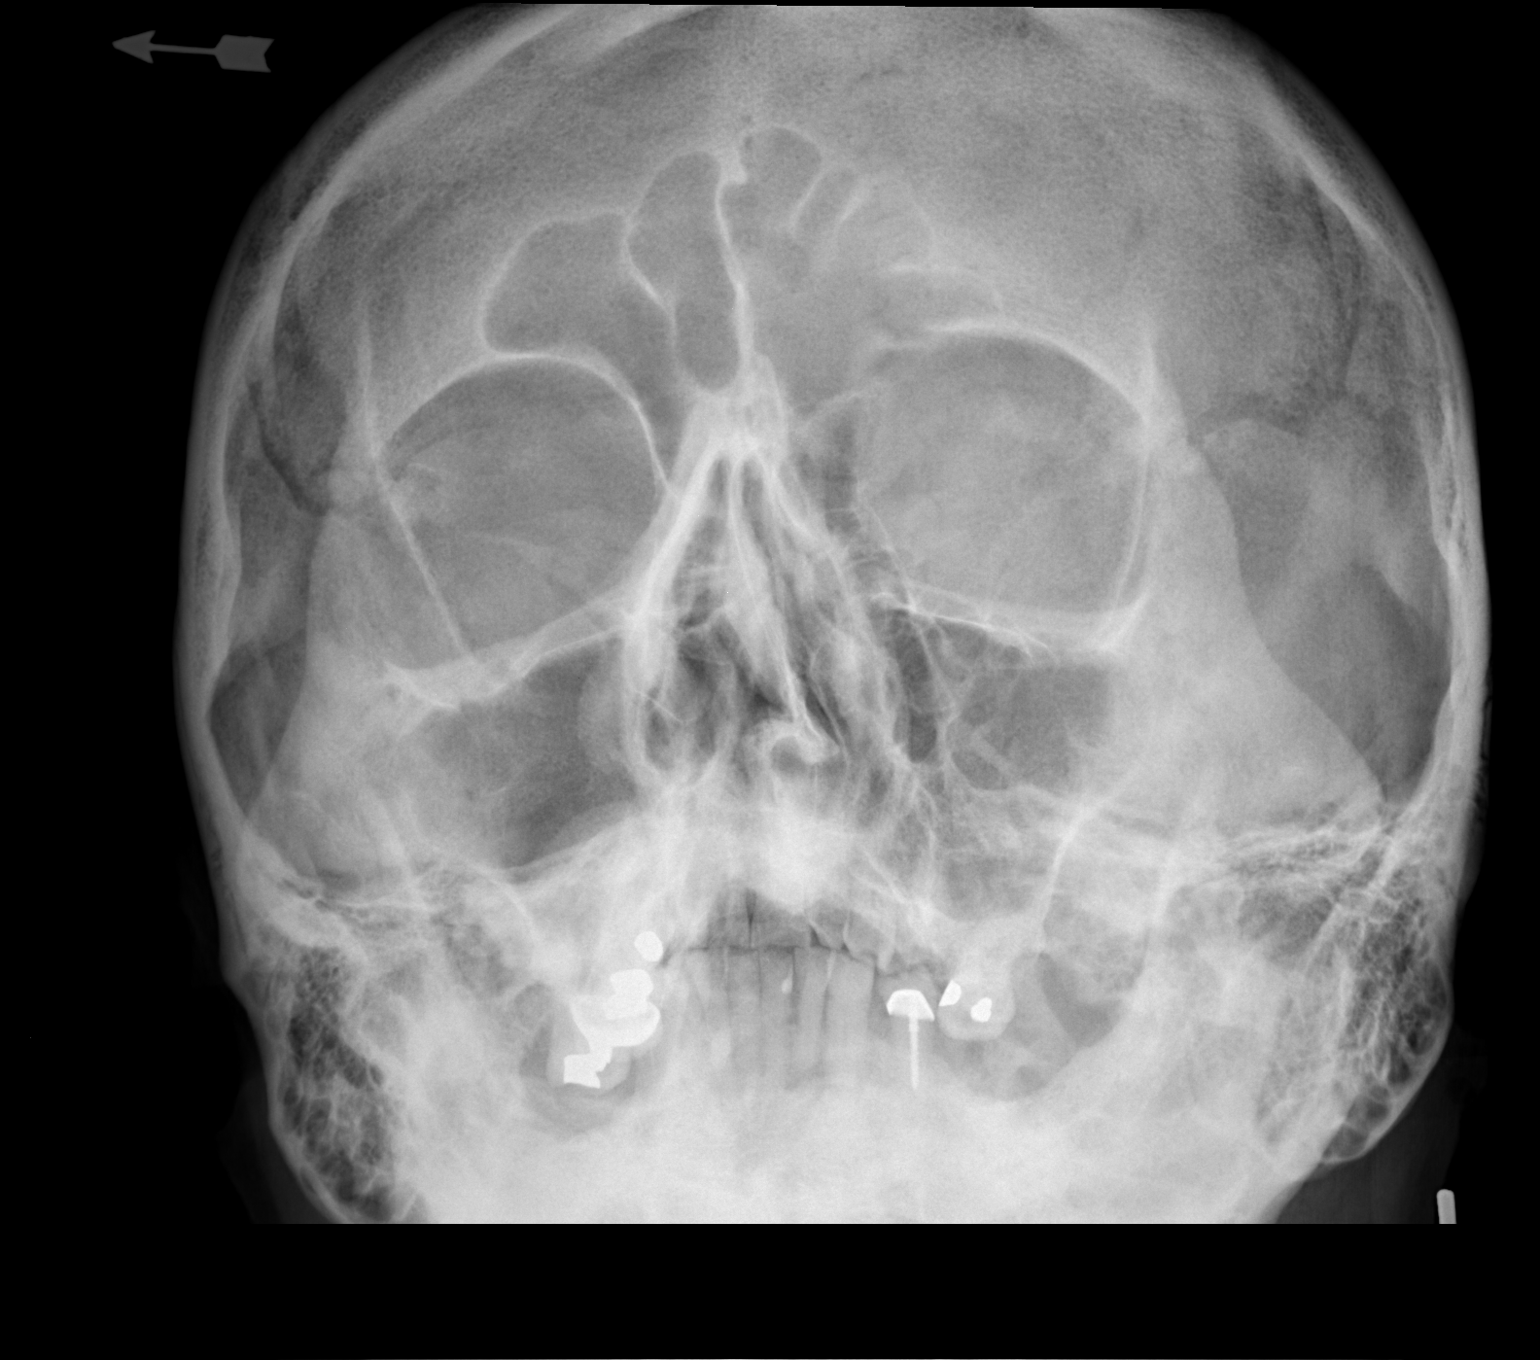

[2 of 2 positions shown; findings below may reference images not displayed]

FINDINGS: Views of the orbits were obtained looking to the left and looking to
the right. No orbital metallic foreign body is seen. The paranasal
sinuses are clear.
IMPRESSION: No evidence of metallic foreign body within the orbits.

## 2015-04-18 ENCOUNTER — Other Ambulatory Visit: Payer: Self-pay | Admitting: Cardiovascular Disease

## 2015-04-18 NOTE — Telephone Encounter (Signed)
REFILL 

## 2015-04-23 ENCOUNTER — Encounter: Payer: Self-pay | Admitting: Internal Medicine

## 2015-04-23 ENCOUNTER — Ambulatory Visit (INDEPENDENT_AMBULATORY_CARE_PROVIDER_SITE_OTHER): Payer: PPO | Admitting: Internal Medicine

## 2015-04-23 VITALS — BP 142/82 | HR 70 | Ht 74.0 in | Wt 260.6 lb

## 2015-04-23 DIAGNOSIS — J449 Chronic obstructive pulmonary disease, unspecified: Secondary | ICD-10-CM

## 2015-04-23 DIAGNOSIS — J8410 Pulmonary fibrosis, unspecified: Secondary | ICD-10-CM | POA: Diagnosis not present

## 2015-04-23 MED ORDER — PANTOPRAZOLE SODIUM 40 MG PO TBEC: 40.0000 mg | DELAYED_RELEASE_TABLET | Freq: Every day | ORAL | Status: DC

## 2015-04-23 NOTE — Assessment & Plan Note (Addendum)
PFT's 12/04/2014 mostly restrictive but ratio 64% - 12/04/2014   75% so try symbicort 160 2bid  - 04/23/2015  extensive coaching HFA effectiveness =    90% from a baseline of 75%   Symptoms remain diiffucult to control despite minimum actual evidence of airflow limitation . DDX of  difficult airways management almost all start with A and  include Adherence, Ace Inhibitors, Acid Reflux, Active Sinus Disease, Alpha 1 Antitripsin deficiency, Anxiety masquerading as Airways dz,  ABPA,  Allergy(esp in young), Aspiration (esp in elderly), Adverse effects of meds,  Active smokers, A bunch of PE's (a small clot burden can't cause this syndrome unless there is already severe underlying pulm or vascular dz with poor reserve) plus two Bs  = Bronchiectasis and Beta blocker use..and one C= CHF    Adherence is always the initial "prime suspect" and is a multilayered concern that requires a "trust but verify" approach in every patient - starting with knowing how to use medications, especially inhalers, correctly, keeping up with refills and understanding the fundamental difference between maintenance and prns vs those medications only taken for a very short course and then stopped and not refilled.  - The proper method of use, as well as anticipated side effects, of a metered-dose inhaler are discussed and demonstrated to the patient. Improved effectiveness after extensive coaching during this visit to a level of approximately 90 % from a baseline of 75 %   ? Adverse effects of meds eg high dose ICS > try symbicort 160 one bid and if better then go with 80 2bid on return   ? Acid (or non-acid) GERD > always difficult to exclude as up to 75% of pts in some series report no assoc GI/ Heartburn symptoms> rec continue max (24h)  acid suppression(try bid ppi)  and diet restrictions/ reviewed     I had an extended discussion with the patient reviewing all relevant studies completed to date and  lasting 15 to 20 minutes of a  25 minute visit    Each maintenance medication was reviewed in detail including most importantly the difference between maintenance and prns and under what circumstances the prns are to be triggered using an action plan format that is not reflected in the computer generated alphabetically organized AVS.    Please see instructions for details which were reviewed in writing and the patient given a copy highlighting the part that I personally wrote and discussed at today's ov.

## 2015-04-23 NOTE — Assessment & Plan Note (Signed)
-   HRCT 08/23/14  1. Pulmonary parenchymal pattern of fibrosis is likely due to usual interstitial pneumonitis (UIP). 2. Enlarged pulmonary arteries, indicative of pulmonary arterial Hypertension. -09/11/2014  Walked RA x 3 laps @ 185 ft each stopped due to End of study, nl pace, no sob or desat  - PFT's  12/04/2014  FEV1 1.39 (43 % ) ratio 64  p no % improvement from saba with DLCO  53 % corrects to 111 % for alv volume  And ERV 20 - 04/23/2015  Walked RA x 3 laps @ 185 ft each stopped due to  End of study, nl pace, no sob or desat  No evidence of dz progression > no change rx

## 2015-04-23 NOTE — Patient Instructions (Addendum)
Try Protonix (pantoprazole) 40 mg Take 30- 60 min before your first and last meals of the day and leave off the pepcid for now  Try reduce symbicort 160 one twice daily to see what difference if any this makes with the breathing or coughing  Work on inhaler technique:  relax and gently blow all the way out then take a nice smooth deep breath back in, triggering the inhaler at same time you start breathing in.  Hold for up to 5 seconds if you can. Blow out thru nose. Rinse and gargle with water when done    Please schedule a follow up visit in 3 months but call sooner if needed

## 2015-04-23 NOTE — Progress Notes (Signed)
Subjective:    Patient ID: Brent [name], male    DOB: 20-Feb-1932,    MRN: [id number]    Brief patient profile:  82 yowm quit smoking in 1978 with cabg late 1990's  No trouble breathing until around summer 2015 then cough onset x 05/2014 with neg cardiac w/u by nishan> referred 09/11/2014  by [name] with PF on CT 08/23/14      History of Present Illness  09/11/2014 1st [facility] office visit/ Raimi Guillermo   Chief Complaint  Patient presents with  . Pulmonary Consult    Referred by [name]. Pt c/o cough, wheezing and SOB for the past 2 months. He states he gets SOB with walking 100 yrds. His cough is mainly non prod but will occ produce some clear sputum.   dyspnea first symptom he noted  x one year just with exertion indolent onset / minimally progressive then coughing x 2-3 months assoc with hb  And since onset of cough having more sob.   Cough not better with saba/ some better p pred/breo rec Try prilosec otc 51m x 2  Take 30-60 min before first meal of the day and Pepcid ac (famotidine) 20 mg one @  bedtime until return (over the counter) GERD diet     10/23/2014 f/u ov/Saad Buhl re: PF / AB on maint gerd rx  Chief Complaint  Patient presents with  . Follow-up    Pt states his breathing has slightly improved and not coughing as much. No new co's today.    Doe x hills only / not sure breo helping  >>d/c BREO   11/14/2014 Acute OV  Complains of 1 day of occasional prod cough with clear mucus, increased SOB and wheezing during evenings. Chest tightness/congestion.  Over last 6 months noticed that his breathing has been going down hill, with more DOE , dry cough and wheezing .  Former smoker 1 PPD x 25 yr . Quit 1979. No occupational exposure . Does work on cars with lots of dust.  No extensive travel . No previous amiodarone use. No hx of RA or Lupus. No known FH of autoimmune disorders.  Denies any fever, sinus pressure/drainage, and nausea or vomitting.   Hx of CAD f/by Dr.  NCathie Olden. Hx of CHF with Echo 2014 -EF 40-45%, PAP 35, RA mod dilation  Myoview 03/2014 low risk.  Works on cars and yard work. 6 months ago could work on hobbies/yard with min dyspnea.  Now gets sob with activiites and has to stop. Walks on flat surfaces okay but inclines and steps very hard.  Says he has OA in hands, back and hip with pain on/off.  Does have reflux often, better on PPI/pepcid.  rec Prednisone taper over next week.  Labs today .  Follow up as planned next month for PFT and office visit with [name]  Continue with GERD diet .    12/04/2014  f/u ov/Cameran Ahmed re:  Chief Complaint  Patient presents with  . Follow-up    PFT done today. Pt states cough and SOB are unchanged. No new co's today. He uses albuterol inhaler 3 x per day on average.    mb and back easier while on Pred with ESR 47 > doe = MMRC1 = can walk nl pace, flat grade, can't hurry or go uphills or steps s sob   Cough is worse at hs also better on pred  Some neck pain if bending over but no sign other  arthritic symptoms rec Reduce micardis 80 to  one half daily  Prednisone 10 mg Take 4 for three days 3 for three days 2 for three days 1 for three days and stop  Symbicort 160 Take 2 puffs first thing in am and then another 2 puffs about 12 hours later - fill it if helping breathing and coughing> helped the breathing but not the cough  Only use your albuterol as a rescue medication   04/23/2015  f/u ov/Twinkle Sockwell re: PF/ gold III codp/ symbiort 160  Chief Complaint  Patient presents with  . Follow-up    Still coughing with occ very light yellow mucus, SOB. Pt denies any wheezing , cp/tightness.  cough tends to be worse after supper despite ppi q am / sleeps ok at hs  No change doe = MMRC1 = can walk nl pace, flat grade, can't hurry or go uphills or steps s sob      No obvious day to day or daytime variability or assoc excess or purulent secretions or cp or chest tightness, subjective wheeze or overt sinus or hb  symptoms. No unusual exp hx or h/o childhood pna/ asthma or knowledge of premature birth.  Sleeping ok without nocturnal  or early am exacerbation  of respiratory  c/o's or need for noct saba. Also denies any obvious fluctuation of symptoms with weather or environmental changes or other aggravating or alleviating factors except as outlined above   Current Medications, Allergies, Complete Past Medical History, Past Surgical History, Family History, and Social History were reviewed in [employer] [facility] record.  ROS  The following are not active complaints unless bolded sore throat, dysphagia, dental problems, itching, sneezing,  nasal congestion or excess/ purulent secretions, ear ache,   fever, chills, sweats, unintended wt loss, classically pleuritic or exertional cp, hemoptysis,  orthopnea pnd or leg swelling, presyncope, palpitations, abdominal pain, anorexia, nausea, vomiting, diarrhea  or change in bowel or bladder habits, change in stools or urine, dysuria,hematuria,  rash, arthralgias, visual complaints, headache, numbness, weakness or ataxia or problems with walking or coordination,  change in mood/affect or memory.                 Objective:   Physical Exam  amb  wm nad   10/23/2014       250 >248 11/14/2014 > 12/04/2014    248  > 04/23/2015   261  Vital signs reviewed    HEENT: nl dentition, turbinates, and orophanx. Nl external ear canals without cough reflex Poor dentition    NECK :  without JVD/Nodes/TM/ nl carotid upstrokes bilaterally No stridor    LUNGS: no acc muscle use,    Bibasilar insp  crackles, scattered rhonchi  Speaking in full sentences  CV:  RRR  no s3 or murmur or increase in P2, no edema   ABD:  soft and nontender with nl excursion in the supine position. No bruits or organomegaly, bowel sounds nl  MS:  warm without deformities, calf tenderness, cyanosis or clubbing - yellow nails "all my life, just like my dad"   SKIN: warm and dry  without lesions    NEURO:  alert, approp, no deficits      HRCT 08/23/14 1. Pulmonary parenchymal pattern of fibrosis is likely due to usual interstitial pneumonitis (UIP). 2. Enlarged pulmonary arteries, indicative of pulmonary arterial hypertension          Assessment & Plan:

## 2015-04-26 DIAGNOSIS — Z0000 Encounter for general adult medical examination without abnormal findings: Secondary | ICD-10-CM | POA: Diagnosis not present

## 2015-04-26 DIAGNOSIS — R05 Cough: Secondary | ICD-10-CM | POA: Diagnosis not present

## 2015-04-26 DIAGNOSIS — Z23 Encounter for immunization: Secondary | ICD-10-CM | POA: Diagnosis not present

## 2015-06-13 ENCOUNTER — Telehealth: Payer: Self-pay | Admitting: Internal Medicine

## 2015-06-13 MED ORDER — PANTOPRAZOLE SODIUM 40 MG PO TBEC: 40.0000 mg | DELAYED_RELEASE_TABLET | Freq: Two times a day (BID) | ORAL | Status: DC

## 2015-06-13 NOTE — Telephone Encounter (Signed)
Patient is at pharmacy and is waiting for call back.  See below. Please call ASAP [phone].

## 2015-06-13 NOTE — Telephone Encounter (Signed)
A new rx has been sent in. Pt is aware. Nothing further was needed.

## 2015-06-13 NOTE — Telephone Encounter (Signed)
lmtcb for pt.  

## 2015-07-14 ENCOUNTER — Other Ambulatory Visit: Payer: Self-pay | Admitting: Cardiovascular Disease

## 2015-07-16 ENCOUNTER — Encounter: Payer: Self-pay | Admitting: Cardiovascular Disease

## 2015-07-16 ENCOUNTER — Ambulatory Visit (INDEPENDENT_AMBULATORY_CARE_PROVIDER_SITE_OTHER): Payer: PPO | Admitting: Cardiovascular Disease

## 2015-07-16 VITALS — BP 112/56 | HR 66 | Ht 73.0 in | Wt 260.8 lb

## 2015-07-16 DIAGNOSIS — I2510 Atherosclerotic heart disease of native coronary artery without angina pectoris: Secondary | ICD-10-CM | POA: Diagnosis not present

## 2015-07-16 DIAGNOSIS — I10 Essential (primary) hypertension: Secondary | ICD-10-CM

## 2015-07-16 DIAGNOSIS — E785 Hyperlipidemia, unspecified: Secondary | ICD-10-CM | POA: Diagnosis not present

## 2015-07-16 LAB — LIPID PANEL
Cholesterol: 130 mg/dL (ref 125–200)
HDL: 33 mg/dL — ABNORMAL LOW (ref >=40)
LDL Cholesterol: 60 mg/dL (ref <130)
Total CHOL/HDL Ratio: 3.9 Ratio (ref <=5.0)
Triglycerides: 185 mg/dL — ABNORMAL HIGH (ref <150)
VLDL: 37 mg/dL — ABNORMAL HIGH (ref <30)

## 2015-07-16 LAB — COMPREHENSIVE METABOLIC PANEL
ALBUMIN: 3.9 g/dL (ref 3.6–5.1)
ALT: 15 U/L (ref 9–46)
AST: 21 U/L (ref 10–35)
Alkaline Phosphatase: 66 U/L (ref 40–115)
BILIRUBIN TOTAL: 0.4 mg/dL (ref 0.2–1.2)
BUN: 34 mg/dL — ABNORMAL HIGH (ref 7–25)
CO2: 25 mmol/L (ref 20–31)
Calcium: 8.9 mg/dL (ref 8.6–10.3)
Chloride: 102 mmol/L (ref 98–110)
Creat: 1.54 mg/dL — ABNORMAL HIGH (ref 0.70–1.11)
Glucose, Bld: 105 mg/dL — ABNORMAL HIGH (ref 65–99)
POTASSIUM: 4.5 mmol/L (ref 3.5–5.3)
Sodium: 138 mmol/L (ref 135–146)
Total Protein: 6.6 g/dL (ref 6.1–8.1)

## 2015-07-16 NOTE — Progress Notes (Signed)
Cardiology Office Note   Date:  10/15/2015   ID:  Brent [name], DOB May 31, 1931, MRN [id number]  PCP:  [name], MD  Cardiologist:   Mertie Moores, MD   Chief Complaint  Patient presents with  . Coronary Artery Disease   1. CAD / CABG - 2000 2. HTN 3. Hyperlipidemia  History of Present Illness:  Brent [name] is 82 y.o. gentleman with a hx of CAD/CABG, hyperlipidemia, HTN. He has not had any chest pain. He has been having lots of left hip pain. He had a steroid injection which helped quite a bit but it appears that the injection has now worn off.  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  Feb. 24, 2014:  He has been having some dyspnea with exercise. He has lots of orthopedic problems. He is exercising some some but not as much as he would like.  Nov. 6 ,2014:  Brent [name] is doing well. He recently had a laser treatment for an esophageal issue. No cardiac issues. Feeling well. Still working on a [name] . He has lost 10 lbs.   September 21, 2013:  Brent [name] is doing well. Has had a right shoulder replacement.  Feb. 17, 2016:  Brent [name] is a 82 y.o. male who presents for follow up of his dyspnea. He has been getting more short of breath over the past several weeks.  Was having DOE climbing up an incline.  He was seen by Nicki Reaper in clinic. He was started on Lasix and feels much better. He has decreased the lasix to twice a week. .  Follow-up Myoview was low risk .Overall Impression: Low risk stress nuclear study With no ischemia identified. Possible apical infarct pattern. Decreased sensitivity secondary to bowel loop attenuation..  LV Ejection Fraction: 51%. LV Wall Motion: Septal wall hypokinesis with otherwise normal LV function  Not having any chest pain   Jul 14, 2014:  Brent [name] is a 82 y.o. male who presents for follow up of his CAD  Having some bronchitis.  No CP . Still working on his antique car. Not exercising much     Jul 16, 2015  Doing well. No cardiac problems  Chronic back problems. Has COPD   Past Medical History  Diagnosis Date  . CAD (coronary artery disease)     Status post CABG  . Hyperlipidemia     takes Crestor daily  . Back pain   . Erectile dysfunction   . History of blood clots     to the lungs   . Pulmonary embolism ([place])   . GERD (gastroesophageal reflux disease)     takes Omeprazole daily  . Dizziness     takes Antivert daily  . HTN (hypertension)     takes Amlodipine and Micardis daily  . Myocardial infarction ([place])   . Arthritis   . AAA (abdominal aortic aneurysm) ([place])   . Hx of cardiovascular stress test     Lexiscan Myoview (2/16):  Possible apical infarct, no ischemia, EF 51%; Low Risk    Past Surgical History  Procedure Laterality Date  . Coronary artery bypass graft  1998    there are sequential 90% stenosis in the proximal LAD, and the mid and distal LAD is a moderate sized vessel. The mid and first diagonal branch has a 50-60% stenosis.   . Cardiovascular stress test  04-28-2005    EF 53%  . Broken jaw      20+ years ago  .  Broken ankle    . Esophagoscopy  06/25/2011    Procedure: ESOPHAGOSCOPY;  Surgeon: Izora Gala, MD;  Location: [facility];  Service: ENT;  Laterality: N/A;  Direct Esophagoscopy  . Tonsillectomy    . Total shoulder arthroplasty Right 06/02/2013    Procedure: RIGHT TOTAL SHOULDER ARTHROPLASTY;  Surgeon: Marin Shutter, MD;  Location: [facility];  Service: Orthopedics;  Laterality: Right;     Current Outpatient Prescriptions  Medication Sig Dispense Refill  . albuterol (PROAIR HFA) 108 (90 BASE) MCG/ACT inhaler Inhale 2 puffs into the lungs every 6 (six) hours as needed for wheezing or shortness of breath.    Marland Kitchen amLODipine (NORVASC) 2.5 MG tablet TAKE 1 TABLET BY MOUTH ONCE DAILY 30 tablet 10  . aspirin EC 81 MG tablet Take 81 mg by mouth daily.    . budesonide-formoterol (SYMBICORT) 160-4.5 MCG/ACT inhaler Take 2 puffs first thing in am and then  another 2 puffs about 12 hours later. 1 Inhaler 11  . diphenhydrAMINE (ALLERGY) 25 MG tablet Take 25 mg by mouth 2 (two) times daily.    . furosemide (LASIX) 40 MG tablet Take 40 mg by mouth 3 (three) times a week. (Mondays, Wednesdays, and Fridays)    . Glucosamine-Chondroit-Vit C-Mn (GLUCOSAMINE 1500 COMPLEX PO) Take 1 tablet by mouth 2 (two) times daily.    . meclizine (ANTIVERT) 25 MG tablet Take 25 mg by mouth every morning.    . Multiple Vitamins-Minerals (MENS 50+ ADVANCED PO) Take 1 tablet by mouth daily.    . nitroGLYCERIN (NITROSTAT) 0.4 MG SL tablet Place 1 tablet (0.4 mg total) under the tongue every 5 (five) minutes as needed. For chest pain 25 tablet 6  . pantoprazole (PROTONIX) 40 MG tablet Take 1 tablet (40 mg total) by mouth 2 (two) times daily. Take 30-60 min before first meal of the day 60 tablet 2  . potassium chloride (K-DUR) 10 MEQ tablet TAKE 1 TABLET BY MOUTH 3 TIMES A WEEK, MONDAY, WEDNESDAY, AND FRIDAY 30 tablet 11  . rosuvastatin (CRESTOR) 40 MG tablet Take 1 tablet (40 mg total) by mouth daily. 90 tablet 2  . telmisartan-hydrochlorothiazide (MICARDIS HCT) 80-25 MG per tablet TAKE 1 TABLET BY MOUTH DAILY 90 tablet 2  . traMADol (ULTRAM) 50 MG tablet Take 50 mg by mouth every 6 (six) hours as needed. pain    . vitamin C (ASCORBIC ACID) 500 MG tablet Take 1,000 mg by mouth daily.     No current facility-administered medications for this visit.    Allergies:   Morphine and Morphine and related    Social History:  The patient  reports that he quit smoking about 39 years ago. His smoking use included Cigarettes. He has a 20 pack-year smoking history. He has never used smokeless tobacco. He reports that he drinks about 0.6 oz of alcohol per week. He reports that he does not use illicit drugs.   Family History:  The patient's family history includes Breast cancer in his mother; Heart attack in his mother; Stroke in his mother.    ROS:  Please see the history of present  illness.    Review of Systems: Constitutional:  denies fever, chills, diaphoresis, appetite change and fatigue.  HEENT: denies photophobia, eye pain, redness, hearing loss, ear pain, congestion, sore throat, rhinorrhea, sneezing, neck pain, neck stiffness and tinnitus.  Respiratory: denies SOB, DOE, cough, chest tightness, and wheezing.  Cardiovascular: denies chest pain, palpitations and leg swelling.  Gastrointestinal: denies nausea, vomiting, abdominal pain, diarrhea, constipation,  blood in stool.  Genitourinary: denies dysuria, urgency, frequency, hematuria, flank pain and difficulty urinating.  Musculoskeletal: denies  myalgias, back pain, joint swelling, arthralgias and gait problem.   Skin: denies pallor, rash and wound.  Neurological: denies dizziness, seizures, syncope, weakness, light-headedness, numbness and headaches.   Hematological: denies adenopathy, easy bruising, personal or family bleeding history.  Psychiatric/ Behavioral: denies suicidal ideation, mood changes, confusion, nervousness, sleep disturbance and agitation.      All other systems are reviewed and negative.    PHYSICAL EXAM: VS:  BP 112/56 mmHg  Pulse 66  Ht 6\' 1"  (1.854 m)  Wt 260 lb 12.8 oz (118.298 kg)  BMI 34.42 kg/m2 , BMI Body mass index is 34.42 kg/(m^2). GEN: Well nourished, well developed, in no acute distress HEENT: normal Neck: no JVD, carotid bruits, or masses Cardiac: RRR; no murmurs, rubs, or gallops,no edema  Respiratory:  clear to auscultation bilaterally, normal work of breathing GI: soft, nontender, nondistended, + BS MS: no deformity or atrophy Skin: warm and dry, no rash Neuro:  Strength and sensation are intact Psych: normal   EKG:  EKG is ordered today.  NSR at 66.   1st degree AV block    Recent Labs: 11/14/2014: BUN 38*; Creatinine, Ser 1.82*; Potassium 4.8; Sodium 139    Lipid Panel    Component Value Date/Time   CHOL 128 07/14/2014 0943   TRIG 103.0 07/14/2014  0943   HDL 38.90* 07/14/2014 0943   CHOLHDL 3 07/14/2014 0943   VLDL 20.6 07/14/2014 0943   LDLCALC 69 07/14/2014 0943      Wt Readings from Last 3 Encounters:  07/16/15 260 lb 12.8 oz (118.298 kg)  04/23/15 260 lb 9.6 oz (118.207 kg)  12/04/14 248 lb (112.492 kg)      Other studies Reviewed: Additional studies/ records that were reviewed today include: . Review of the above records demonstrates:    ASSESSMENT AND PLAN:  1. CAD / CABG - 2000 - doing well. No angina.  Continue same meds Not able to walk much due to back pain .  2. HTN- BP is ok , continue meds   3. Hyperlipidemia - will check labs today .  Will see in 1 year with repeat fasting labs.    Current medicines are reviewed at length with the patient today.  The patient does not have concerns regarding medicines.  The following changes have been made:  no change  Labs/ tests ordered today include:  No orders of the defined types were placed in this encounter.     Disposition:   FU with me in 1 year     Mertie Moores, MD  10-Jun-202017 3:40 PM    [facility] [address], [place], [place]  [address] Phone: [phone]; Fax: [phone]   [facility]  [address] [place], [place]  [address] [phone]    Fax [phone]

## 2015-07-16 NOTE — Telephone Encounter (Signed)
Rx(s) sent to pharmacy electronically.  

## 2015-07-16 NOTE — Patient Instructions (Signed)

## 2015-07-24 ENCOUNTER — Ambulatory Visit (INDEPENDENT_AMBULATORY_CARE_PROVIDER_SITE_OTHER): Payer: PPO | Admitting: Internal Medicine

## 2015-07-24 ENCOUNTER — Encounter: Payer: Self-pay | Admitting: Internal Medicine

## 2015-07-24 VITALS — BP 106/60 | HR 63 | Ht 73.0 in | Wt 258.0 lb

## 2015-07-24 DIAGNOSIS — J449 Chronic obstructive pulmonary disease, unspecified: Secondary | ICD-10-CM | POA: Diagnosis not present

## 2015-07-24 DIAGNOSIS — J8410 Pulmonary fibrosis, unspecified: Secondary | ICD-10-CM | POA: Diagnosis not present

## 2015-07-24 NOTE — Progress Notes (Signed)
Subjective:    Patient ID: Brent [name], male    DOB: 20-Feb-1932,    MRN: [id number]    Brief patient profile:  82 yowm quit smoking in 1978 with cabg late 1990's  No trouble breathing until around summer 2015 then cough onset x 05/2014 with neg cardiac w/u by nishan> referred 09/11/2014  by [name] with PF on CT 08/23/14      History of Present Illness  09/11/2014 1st [facility] office visit/ [name]   Chief Complaint  Patient presents with  . Pulmonary Consult    Referred by [name]. Pt c/o cough, wheezing and SOB for the past 2 months. He states he gets SOB with walking 100 yrds. His cough is mainly non prod but will occ produce some clear sputum.   dyspnea first symptom he noted  x one year just with exertion indolent onset / minimally progressive then coughing x 2-3 months assoc with hb  And since onset of cough having more sob.   Cough not better with saba/ some better p pred/breo rec Try prilosec otc 51m x 2  Take 30-60 min before first meal of the day and Pepcid ac (famotidine) 20 mg one @  bedtime until return (over the counter) GERD diet     10/23/2014 f/u ov/[name] re: PF / AB on maint gerd rx  Chief Complaint  Patient presents with  . Follow-up    Pt states his breathing has slightly improved and not coughing as much. No new co's today.    Doe x hills only / not sure breo helping  >>d/c BREO   11/14/2014 Acute OV  Complains of 1 day of occasional prod cough with clear mucus, increased SOB and wheezing during evenings. Chest tightness/congestion.  Over last 6 months noticed that his breathing has been going down hill, with more DOE , dry cough and wheezing .  Former smoker 1 PPD x 25 yr . Quit 1979. No occupational exposure . Does work on cars with lots of dust.  No extensive travel . No previous amiodarone use. No hx of RA or Lupus. No known FH of autoimmune disorders.  Denies any fever, sinus pressure/drainage, and nausea or vomitting.   Hx of CAD f/by Dr.  NCathie Olden. Hx of CHF with Echo 2014 -EF 40-45%, PAP 35, RA mod dilation  Myoview 03/2014 low risk.  Works on cars and yard work. 6 months ago could work on hobbies/yard with min dyspnea.  Now gets sob with activiites and has to stop. Walks on flat surfaces okay but inclines and steps very hard.  Says he has OA in hands, back and hip with pain on/off.  Does have reflux often, better on PPI/pepcid.  rec Prednisone taper over next week.  Labs today .  Follow up as planned next month for PFT and office visit with [name]  Continue with GERD diet .    12/04/2014  f/u ov/[name] re:  Chief Complaint  Patient presents with  . Follow-up    PFT done today. Pt states cough and SOB are unchanged. No new co's today. He uses albuterol inhaler 3 x per day on average.    mb and back easier while on Pred with ESR 47 > doe = MMRC1 = can walk nl pace, flat grade, can't hurry or go uphills or steps s sob   Cough is worse at hs also better on pred  Some neck pain if bending over but no sign other  arthritic symptoms rec Reduce micardis 80 to  one half daily  Prednisone 10 mg Take 4 for three days 3 for three days 2 for three days 1 for three days and stop  Symbicort 160 Take 2 puffs first thing in am and then another 2 puffs about 12 hours later - fill it if helping breathing and coughing> helped the breathing but not the cough  Only use your albuterol as a rescue medication   04/23/2015  f/u ov/[name] re: PF/ gold III codp/ symbiort 160  Chief Complaint  Patient presents with  . Follow-up    Still coughing with occ very light yellow mucus, SOB. Pt denies any wheezing , cp/tightness.  cough tends to be worse after supper despite ppi q am / sleeps ok at hs  No change doe = MMRC1 = can walk nl pace, flat grade, can't hurry or go uphills or steps s sob   rec Try Protonix (pantoprazole) 40 mg Take 30- 60 min before your first and last meals of the day and leave off the pepcid for now Try reduce symbicort 160  one twice daily to see what difference if any this makes with the breathing or coughing   07/24/2015  f/u ov/[name] re:  PF/ GOLD III copd/ symb 160 one bid  Chief Complaint  Patient presents with  . Follow-up    Pt states his cough and SOB have improved some. He has not had to use proair.   limited more by back/leg pain than limiting sob which if anything is better on symb one bid    No obvious day to day or daytime variability or assoc excess or purulent secretions or cp or chest tightness, subjective wheeze or overt sinus or hb symptoms. No unusual exp hx or h/o childhood pna/ asthma or knowledge of premature birth.  Sleeping ok without nocturnal  or early am exacerbation  of respiratory  c/o's or need for noct saba. Also denies any obvious fluctuation of symptoms with weather or environmental changes or other aggravating or alleviating factors except as outlined above   Current Medications, Allergies, Complete Past Medical History, Past Surgical History, Family History, and Social History were reviewed in [employer] record.  ROS  The following are not active complaints unless bolded sore throat, dysphagia, dental problems, itching, sneezing,  nasal congestion or excess/ purulent secretions, ear ache,   fever, chills, sweats, unintended wt loss, classically pleuritic or exertional cp, hemoptysis,  orthopnea pnd or leg swelling, presyncope, palpitations, abdominal pain, anorexia, nausea, vomiting, diarrhea  or change in bowel or bladder habits, change in stools or urine, dysuria,hematuria,  rash, arthralgias, visual complaints, headache, numbness, weakness or ataxia or problems with walking or coordination,  change in mood/affect or memory.                 Objective:   Physical Exam  amb  wm nad   10/23/2014       250 >248 11/14/2014 > 12/04/2014    248  > 04/23/2015   261 > 07/24/2015  258 Vital signs reviewed    HEENT: nl dentition, turbinates, and orophanx. Nl  external ear canals without cough reflex Poor dentition    NECK :  without JVD/Nodes/TM/ nl carotid upstrokes bilaterally No stridor    LUNGS: no acc muscle use,    Bibasilar insp  crackles, scattered rhonchi  Speaking in full sentences  CV:  RRR  no s3 or murmur or increase in P2, no edema  ABD:  soft and nontender with nl excursion in the supine position. No bruits or organomegaly, bowel sounds nl  MS:  warm without deformities, calf tenderness, cyanosis or clubbing - yellow nails "all my life, just like my dad"   SKIN: warm and dry without lesions    NEURO:  alert, approp, no deficits      HRCT 08/23/14 1. Pulmonary parenchymal pattern of fibrosis is likely due to usual interstitial pneumonitis (UIP). 2. Enlarged pulmonary arteries, indicative of pulmonary arterial hypertension          Assessment & Plan:

## 2015-07-24 NOTE — Patient Instructions (Signed)
Try off symbicort  Agree with rheumatology evaluation but prefer let Reade coordinate and advise on use of advil/alleve or alternatives  Follow up in Oct 2017 for pfts - call sooner if breathing worse back on symbicort at 2 every 12 hours

## 2015-07-26 DIAGNOSIS — M545 Low back pain: Secondary | ICD-10-CM | POA: Diagnosis not present

## 2015-07-26 DIAGNOSIS — N183 Chronic kidney disease, stage 3 (moderate): Secondary | ICD-10-CM | POA: Diagnosis not present

## 2015-07-26 DIAGNOSIS — M542 Cervicalgia: Secondary | ICD-10-CM | POA: Diagnosis not present

## 2015-07-28 NOTE — Assessment & Plan Note (Signed)
PFT's 12/04/2014 mostly restrictive but ratio 64% so technically GOLD III but quite mild - 12/04/2014   75% so try symbicort 160 2bid  - 04/23/2015  extensive coaching HFA effectiveness =    90% from a baseline of 75%  - 07/24/2015 try off symbicort at pt's request > can take up to 2 pffs q 12h prn

## 2015-07-28 NOTE — Assessment & Plan Note (Signed)
-   HRCT 08/23/14  1. Pulmonary parenchymal pattern of fibrosis is likely due to usual interstitial pneumonitis (UIP). 2. Enlarged pulmonary arteries, indicative of pulmonary arterial Hypertension. -09/11/2014  Walked RA x 3 laps @ 185 ft each stopped due to End of study, nl pace, no sob or desat  - PFT's  12/04/2014  FEV1 1.39 (43 % ) ratio 64  p no % improvement from saba with DLCO  53 % corrects to 111 % for alv volume  And ERV 20 - 04/23/2015  Walked RA x 3 laps @ 185 ft each stopped due to  End of study, nl pace, no sob or desat    Main symptoms at this point are clearly related to joints and agree with rheum eval at this point and f/u with yearly pfts/ walking sats to judge whether on not progression in ILD      I had an extended discussion with the patient reviewing all relevant studies completed to date and  lasting 15 to 20 minutes of a 25 minute visit    Discussed in detail all the  indications, usual  risks and alternatives  relative to the benefits with patient who agrees to proceed with conservative f/u as outlined    Each maintenance medication was reviewed in detail including most importantly the difference between maintenance and prns and under what circumstances the prns are to be triggered using an action plan format that is not reflected in the computer generated alphabetically organized AVS.    Please see instructions for details which were reviewed in writing and the patient given a copy highlighting the part that I personally wrote and discussed at today's ov.

## 2015-08-01 ENCOUNTER — Other Ambulatory Visit: Payer: Self-pay | Admitting: Cardiovascular Disease

## 2015-08-16 DIAGNOSIS — L814 Other melanin hyperpigmentation: Secondary | ICD-10-CM | POA: Diagnosis not present

## 2015-08-16 DIAGNOSIS — D225 Melanocytic nevi of trunk: Secondary | ICD-10-CM | POA: Diagnosis not present

## 2015-08-16 DIAGNOSIS — Z85820 Personal history of malignant melanoma of skin: Secondary | ICD-10-CM | POA: Diagnosis not present

## 2015-08-16 DIAGNOSIS — L821 Other seborrheic keratosis: Secondary | ICD-10-CM | POA: Diagnosis not present

## 2015-08-16 DIAGNOSIS — L570 Actinic keratosis: Secondary | ICD-10-CM | POA: Diagnosis not present

## 2015-08-18 ENCOUNTER — Other Ambulatory Visit: Payer: Self-pay | Admitting: Internal Medicine

## 2015-08-20 ENCOUNTER — Other Ambulatory Visit: Payer: Self-pay | Admitting: Internal Medicine

## 2015-08-20 DIAGNOSIS — M9901 Segmental and somatic dysfunction of cervical region: Secondary | ICD-10-CM | POA: Diagnosis not present

## 2015-08-20 DIAGNOSIS — M9904 Segmental and somatic dysfunction of sacral region: Secondary | ICD-10-CM | POA: Diagnosis not present

## 2015-08-20 DIAGNOSIS — M9902 Segmental and somatic dysfunction of thoracic region: Secondary | ICD-10-CM | POA: Diagnosis not present

## 2015-08-20 DIAGNOSIS — Q72812 Congenital shortening of left lower limb: Secondary | ICD-10-CM | POA: Diagnosis not present

## 2015-08-20 DIAGNOSIS — M9905 Segmental and somatic dysfunction of pelvic region: Secondary | ICD-10-CM | POA: Diagnosis not present

## 2015-08-20 DIAGNOSIS — M9903 Segmental and somatic dysfunction of lumbar region: Secondary | ICD-10-CM | POA: Diagnosis not present

## 2015-08-20 DIAGNOSIS — M5384 Other specified dorsopathies, thoracic region: Secondary | ICD-10-CM | POA: Diagnosis not present

## 2015-08-20 DIAGNOSIS — M5137 Other intervertebral disc degeneration, lumbosacral region: Secondary | ICD-10-CM | POA: Diagnosis not present

## 2015-08-20 DIAGNOSIS — M50322 Other cervical disc degeneration at C5-C6 level: Secondary | ICD-10-CM | POA: Diagnosis not present

## 2015-08-24 DIAGNOSIS — M5137 Other intervertebral disc degeneration, lumbosacral region: Secondary | ICD-10-CM | POA: Diagnosis not present

## 2015-08-24 DIAGNOSIS — M9902 Segmental and somatic dysfunction of thoracic region: Secondary | ICD-10-CM | POA: Diagnosis not present

## 2015-08-24 DIAGNOSIS — M9905 Segmental and somatic dysfunction of pelvic region: Secondary | ICD-10-CM | POA: Diagnosis not present

## 2015-08-24 DIAGNOSIS — M9901 Segmental and somatic dysfunction of cervical region: Secondary | ICD-10-CM | POA: Diagnosis not present

## 2015-08-24 DIAGNOSIS — M5384 Other specified dorsopathies, thoracic region: Secondary | ICD-10-CM | POA: Diagnosis not present

## 2015-08-24 DIAGNOSIS — Q72812 Congenital shortening of left lower limb: Secondary | ICD-10-CM | POA: Diagnosis not present

## 2015-08-24 DIAGNOSIS — M50322 Other cervical disc degeneration at C5-C6 level: Secondary | ICD-10-CM | POA: Diagnosis not present

## 2015-08-24 DIAGNOSIS — M9904 Segmental and somatic dysfunction of sacral region: Secondary | ICD-10-CM | POA: Diagnosis not present

## 2015-08-24 DIAGNOSIS — M9903 Segmental and somatic dysfunction of lumbar region: Secondary | ICD-10-CM | POA: Diagnosis not present

## 2015-08-27 ENCOUNTER — Telehealth: Payer: Self-pay | Admitting: Cardiovascular Disease

## 2015-08-27 DIAGNOSIS — M5137 Other intervertebral disc degeneration, lumbosacral region: Secondary | ICD-10-CM | POA: Diagnosis not present

## 2015-08-27 DIAGNOSIS — Q72812 Congenital shortening of left lower limb: Secondary | ICD-10-CM | POA: Diagnosis not present

## 2015-08-27 DIAGNOSIS — M9905 Segmental and somatic dysfunction of pelvic region: Secondary | ICD-10-CM | POA: Diagnosis not present

## 2015-08-27 DIAGNOSIS — M9902 Segmental and somatic dysfunction of thoracic region: Secondary | ICD-10-CM | POA: Diagnosis not present

## 2015-08-27 DIAGNOSIS — M9901 Segmental and somatic dysfunction of cervical region: Secondary | ICD-10-CM | POA: Diagnosis not present

## 2015-08-27 DIAGNOSIS — M9904 Segmental and somatic dysfunction of sacral region: Secondary | ICD-10-CM | POA: Diagnosis not present

## 2015-08-27 DIAGNOSIS — M9903 Segmental and somatic dysfunction of lumbar region: Secondary | ICD-10-CM | POA: Diagnosis not present

## 2015-08-27 DIAGNOSIS — M5384 Other specified dorsopathies, thoracic region: Secondary | ICD-10-CM | POA: Diagnosis not present

## 2015-08-27 DIAGNOSIS — M50322 Other cervical disc degeneration at C5-C6 level: Secondary | ICD-10-CM | POA: Diagnosis not present

## 2015-08-27 NOTE — Telephone Encounter (Signed)
Yes, this is known He has been followed by Dr. Donnetta Hutching at [facility] for this

## 2015-08-27 NOTE — Telephone Encounter (Signed)
New Message  Dr Amalia Hailey calling to speak w/ rN about pt's aneurysm in his stomach and discuss if Dr Acie Fredrickson is aware,. Please call back and discuss. \

## 2015-08-27 NOTE — Telephone Encounter (Signed)
Dr. Amalia Hailey called to ensure Dr. Acie Fredrickson knows about patient's AAA. She is a Restaurant manager, fast food and the patient is having back pain. She did a chest x-ray and the AAA was seen.  Informed her that the AAA was noted in Dr. [name] OV note and that serial chest CTs are being done by Dr. Donnetta Hutching. She is faxing x-ray report to [place]. Informed her this information will be given to Dr. Acie Fredrickson for further review. She was grateful for assistance.

## 2015-08-29 DIAGNOSIS — M50322 Other cervical disc degeneration at C5-C6 level: Secondary | ICD-10-CM | POA: Diagnosis not present

## 2015-08-29 DIAGNOSIS — M9905 Segmental and somatic dysfunction of pelvic region: Secondary | ICD-10-CM | POA: Diagnosis not present

## 2015-08-29 DIAGNOSIS — M5384 Other specified dorsopathies, thoracic region: Secondary | ICD-10-CM | POA: Diagnosis not present

## 2015-08-29 DIAGNOSIS — M9902 Segmental and somatic dysfunction of thoracic region: Secondary | ICD-10-CM | POA: Diagnosis not present

## 2015-08-29 DIAGNOSIS — Q72812 Congenital shortening of left lower limb: Secondary | ICD-10-CM | POA: Diagnosis not present

## 2015-08-29 DIAGNOSIS — M9901 Segmental and somatic dysfunction of cervical region: Secondary | ICD-10-CM | POA: Diagnosis not present

## 2015-08-29 DIAGNOSIS — M9903 Segmental and somatic dysfunction of lumbar region: Secondary | ICD-10-CM | POA: Diagnosis not present

## 2015-08-29 DIAGNOSIS — M5137 Other intervertebral disc degeneration, lumbosacral region: Secondary | ICD-10-CM | POA: Diagnosis not present

## 2015-08-29 DIAGNOSIS — M9904 Segmental and somatic dysfunction of sacral region: Secondary | ICD-10-CM | POA: Diagnosis not present

## 2015-08-31 DIAGNOSIS — M9905 Segmental and somatic dysfunction of pelvic region: Secondary | ICD-10-CM | POA: Diagnosis not present

## 2015-08-31 DIAGNOSIS — M50322 Other cervical disc degeneration at C5-C6 level: Secondary | ICD-10-CM | POA: Diagnosis not present

## 2015-08-31 DIAGNOSIS — M9904 Segmental and somatic dysfunction of sacral region: Secondary | ICD-10-CM | POA: Diagnosis not present

## 2015-08-31 DIAGNOSIS — Q72812 Congenital shortening of left lower limb: Secondary | ICD-10-CM | POA: Diagnosis not present

## 2015-08-31 DIAGNOSIS — M9902 Segmental and somatic dysfunction of thoracic region: Secondary | ICD-10-CM | POA: Diagnosis not present

## 2015-08-31 DIAGNOSIS — M5137 Other intervertebral disc degeneration, lumbosacral region: Secondary | ICD-10-CM | POA: Diagnosis not present

## 2015-08-31 DIAGNOSIS — M9901 Segmental and somatic dysfunction of cervical region: Secondary | ICD-10-CM | POA: Diagnosis not present

## 2015-08-31 DIAGNOSIS — M5384 Other specified dorsopathies, thoracic region: Secondary | ICD-10-CM | POA: Diagnosis not present

## 2015-08-31 DIAGNOSIS — M9903 Segmental and somatic dysfunction of lumbar region: Secondary | ICD-10-CM | POA: Diagnosis not present

## 2015-09-05 DIAGNOSIS — M9901 Segmental and somatic dysfunction of cervical region: Secondary | ICD-10-CM | POA: Diagnosis not present

## 2015-09-05 DIAGNOSIS — M9905 Segmental and somatic dysfunction of pelvic region: Secondary | ICD-10-CM | POA: Diagnosis not present

## 2015-09-05 DIAGNOSIS — M9904 Segmental and somatic dysfunction of sacral region: Secondary | ICD-10-CM | POA: Diagnosis not present

## 2015-09-05 DIAGNOSIS — Q72812 Congenital shortening of left lower limb: Secondary | ICD-10-CM | POA: Diagnosis not present

## 2015-09-05 DIAGNOSIS — M5137 Other intervertebral disc degeneration, lumbosacral region: Secondary | ICD-10-CM | POA: Diagnosis not present

## 2015-09-05 DIAGNOSIS — M50322 Other cervical disc degeneration at C5-C6 level: Secondary | ICD-10-CM | POA: Diagnosis not present

## 2015-09-05 DIAGNOSIS — M9903 Segmental and somatic dysfunction of lumbar region: Secondary | ICD-10-CM | POA: Diagnosis not present

## 2015-09-05 DIAGNOSIS — M9902 Segmental and somatic dysfunction of thoracic region: Secondary | ICD-10-CM | POA: Diagnosis not present

## 2015-09-05 DIAGNOSIS — M5384 Other specified dorsopathies, thoracic region: Secondary | ICD-10-CM | POA: Diagnosis not present

## 2015-09-07 DIAGNOSIS — M9901 Segmental and somatic dysfunction of cervical region: Secondary | ICD-10-CM | POA: Diagnosis not present

## 2015-09-07 DIAGNOSIS — M9903 Segmental and somatic dysfunction of lumbar region: Secondary | ICD-10-CM | POA: Diagnosis not present

## 2015-09-07 DIAGNOSIS — M9902 Segmental and somatic dysfunction of thoracic region: Secondary | ICD-10-CM | POA: Diagnosis not present

## 2015-09-07 DIAGNOSIS — Q72812 Congenital shortening of left lower limb: Secondary | ICD-10-CM | POA: Diagnosis not present

## 2015-09-07 DIAGNOSIS — M50322 Other cervical disc degeneration at C5-C6 level: Secondary | ICD-10-CM | POA: Diagnosis not present

## 2015-09-07 DIAGNOSIS — M9905 Segmental and somatic dysfunction of pelvic region: Secondary | ICD-10-CM | POA: Diagnosis not present

## 2015-09-07 DIAGNOSIS — M9904 Segmental and somatic dysfunction of sacral region: Secondary | ICD-10-CM | POA: Diagnosis not present

## 2015-09-07 DIAGNOSIS — M5384 Other specified dorsopathies, thoracic region: Secondary | ICD-10-CM | POA: Diagnosis not present

## 2015-09-07 DIAGNOSIS — M5137 Other intervertebral disc degeneration, lumbosacral region: Secondary | ICD-10-CM | POA: Diagnosis not present

## 2015-09-12 ENCOUNTER — Encounter: Payer: Self-pay | Admitting: Cardiovascular Disease

## 2015-09-12 DIAGNOSIS — M9903 Segmental and somatic dysfunction of lumbar region: Secondary | ICD-10-CM | POA: Diagnosis not present

## 2015-09-12 DIAGNOSIS — M9904 Segmental and somatic dysfunction of sacral region: Secondary | ICD-10-CM | POA: Diagnosis not present

## 2015-09-12 DIAGNOSIS — M9905 Segmental and somatic dysfunction of pelvic region: Secondary | ICD-10-CM | POA: Diagnosis not present

## 2015-09-12 DIAGNOSIS — M9902 Segmental and somatic dysfunction of thoracic region: Secondary | ICD-10-CM | POA: Diagnosis not present

## 2015-09-12 DIAGNOSIS — M5137 Other intervertebral disc degeneration, lumbosacral region: Secondary | ICD-10-CM | POA: Diagnosis not present

## 2015-09-12 DIAGNOSIS — M50322 Other cervical disc degeneration at C5-C6 level: Secondary | ICD-10-CM | POA: Diagnosis not present

## 2015-09-12 DIAGNOSIS — M5384 Other specified dorsopathies, thoracic region: Secondary | ICD-10-CM | POA: Diagnosis not present

## 2015-09-12 DIAGNOSIS — M9901 Segmental and somatic dysfunction of cervical region: Secondary | ICD-10-CM | POA: Diagnosis not present

## 2015-09-12 DIAGNOSIS — Q72812 Congenital shortening of left lower limb: Secondary | ICD-10-CM | POA: Diagnosis not present

## 2015-09-14 DIAGNOSIS — M9903 Segmental and somatic dysfunction of lumbar region: Secondary | ICD-10-CM | POA: Diagnosis not present

## 2015-09-14 DIAGNOSIS — M5384 Other specified dorsopathies, thoracic region: Secondary | ICD-10-CM | POA: Diagnosis not present

## 2015-09-14 DIAGNOSIS — M9901 Segmental and somatic dysfunction of cervical region: Secondary | ICD-10-CM | POA: Diagnosis not present

## 2015-09-14 DIAGNOSIS — M9905 Segmental and somatic dysfunction of pelvic region: Secondary | ICD-10-CM | POA: Diagnosis not present

## 2015-09-14 DIAGNOSIS — M50322 Other cervical disc degeneration at C5-C6 level: Secondary | ICD-10-CM | POA: Diagnosis not present

## 2015-09-14 DIAGNOSIS — M9904 Segmental and somatic dysfunction of sacral region: Secondary | ICD-10-CM | POA: Diagnosis not present

## 2015-09-14 DIAGNOSIS — M9902 Segmental and somatic dysfunction of thoracic region: Secondary | ICD-10-CM | POA: Diagnosis not present

## 2015-09-14 DIAGNOSIS — Q72812 Congenital shortening of left lower limb: Secondary | ICD-10-CM | POA: Diagnosis not present

## 2015-09-14 DIAGNOSIS — M5137 Other intervertebral disc degeneration, lumbosacral region: Secondary | ICD-10-CM | POA: Diagnosis not present

## 2015-09-17 DIAGNOSIS — M5137 Other intervertebral disc degeneration, lumbosacral region: Secondary | ICD-10-CM | POA: Diagnosis not present

## 2015-09-17 DIAGNOSIS — Q72812 Congenital shortening of left lower limb: Secondary | ICD-10-CM | POA: Diagnosis not present

## 2015-09-17 DIAGNOSIS — M9905 Segmental and somatic dysfunction of pelvic region: Secondary | ICD-10-CM | POA: Diagnosis not present

## 2015-09-17 DIAGNOSIS — M9902 Segmental and somatic dysfunction of thoracic region: Secondary | ICD-10-CM | POA: Diagnosis not present

## 2015-09-17 DIAGNOSIS — M9904 Segmental and somatic dysfunction of sacral region: Secondary | ICD-10-CM | POA: Diagnosis not present

## 2015-09-17 DIAGNOSIS — M9901 Segmental and somatic dysfunction of cervical region: Secondary | ICD-10-CM | POA: Diagnosis not present

## 2015-09-17 DIAGNOSIS — M9903 Segmental and somatic dysfunction of lumbar region: Secondary | ICD-10-CM | POA: Diagnosis not present

## 2015-09-17 DIAGNOSIS — M50322 Other cervical disc degeneration at C5-C6 level: Secondary | ICD-10-CM | POA: Diagnosis not present

## 2015-09-17 DIAGNOSIS — M5384 Other specified dorsopathies, thoracic region: Secondary | ICD-10-CM | POA: Diagnosis not present

## 2015-09-20 DIAGNOSIS — M9905 Segmental and somatic dysfunction of pelvic region: Secondary | ICD-10-CM | POA: Diagnosis not present

## 2015-09-20 DIAGNOSIS — M9902 Segmental and somatic dysfunction of thoracic region: Secondary | ICD-10-CM | POA: Diagnosis not present

## 2015-09-20 DIAGNOSIS — M9904 Segmental and somatic dysfunction of sacral region: Secondary | ICD-10-CM | POA: Diagnosis not present

## 2015-09-20 DIAGNOSIS — M50322 Other cervical disc degeneration at C5-C6 level: Secondary | ICD-10-CM | POA: Diagnosis not present

## 2015-09-20 DIAGNOSIS — M9903 Segmental and somatic dysfunction of lumbar region: Secondary | ICD-10-CM | POA: Diagnosis not present

## 2015-09-20 DIAGNOSIS — M9901 Segmental and somatic dysfunction of cervical region: Secondary | ICD-10-CM | POA: Diagnosis not present

## 2015-09-20 DIAGNOSIS — Q72812 Congenital shortening of left lower limb: Secondary | ICD-10-CM | POA: Diagnosis not present

## 2015-09-20 DIAGNOSIS — M5384 Other specified dorsopathies, thoracic region: Secondary | ICD-10-CM | POA: Diagnosis not present

## 2015-09-20 DIAGNOSIS — M5137 Other intervertebral disc degeneration, lumbosacral region: Secondary | ICD-10-CM | POA: Diagnosis not present

## 2015-09-24 DIAGNOSIS — M50322 Other cervical disc degeneration at C5-C6 level: Secondary | ICD-10-CM | POA: Diagnosis not present

## 2015-09-24 DIAGNOSIS — Q72812 Congenital shortening of left lower limb: Secondary | ICD-10-CM | POA: Diagnosis not present

## 2015-09-24 DIAGNOSIS — M9901 Segmental and somatic dysfunction of cervical region: Secondary | ICD-10-CM | POA: Diagnosis not present

## 2015-09-24 DIAGNOSIS — M9903 Segmental and somatic dysfunction of lumbar region: Secondary | ICD-10-CM | POA: Diagnosis not present

## 2015-09-24 DIAGNOSIS — M5384 Other specified dorsopathies, thoracic region: Secondary | ICD-10-CM | POA: Diagnosis not present

## 2015-09-24 DIAGNOSIS — M9905 Segmental and somatic dysfunction of pelvic region: Secondary | ICD-10-CM | POA: Diagnosis not present

## 2015-09-24 DIAGNOSIS — M5137 Other intervertebral disc degeneration, lumbosacral region: Secondary | ICD-10-CM | POA: Diagnosis not present

## 2015-09-24 DIAGNOSIS — M9904 Segmental and somatic dysfunction of sacral region: Secondary | ICD-10-CM | POA: Diagnosis not present

## 2015-09-24 DIAGNOSIS — M9902 Segmental and somatic dysfunction of thoracic region: Secondary | ICD-10-CM | POA: Diagnosis not present

## 2015-09-27 DIAGNOSIS — M50322 Other cervical disc degeneration at C5-C6 level: Secondary | ICD-10-CM | POA: Diagnosis not present

## 2015-09-27 DIAGNOSIS — M9905 Segmental and somatic dysfunction of pelvic region: Secondary | ICD-10-CM | POA: Diagnosis not present

## 2015-09-27 DIAGNOSIS — M9904 Segmental and somatic dysfunction of sacral region: Secondary | ICD-10-CM | POA: Diagnosis not present

## 2015-09-27 DIAGNOSIS — M9901 Segmental and somatic dysfunction of cervical region: Secondary | ICD-10-CM | POA: Diagnosis not present

## 2015-09-27 DIAGNOSIS — Q72812 Congenital shortening of left lower limb: Secondary | ICD-10-CM | POA: Diagnosis not present

## 2015-09-27 DIAGNOSIS — M5384 Other specified dorsopathies, thoracic region: Secondary | ICD-10-CM | POA: Diagnosis not present

## 2015-09-27 DIAGNOSIS — M9902 Segmental and somatic dysfunction of thoracic region: Secondary | ICD-10-CM | POA: Diagnosis not present

## 2015-09-27 DIAGNOSIS — M5137 Other intervertebral disc degeneration, lumbosacral region: Secondary | ICD-10-CM | POA: Diagnosis not present

## 2015-09-27 DIAGNOSIS — M9903 Segmental and somatic dysfunction of lumbar region: Secondary | ICD-10-CM | POA: Diagnosis not present

## 2015-10-03 DIAGNOSIS — M9903 Segmental and somatic dysfunction of lumbar region: Secondary | ICD-10-CM | POA: Diagnosis not present

## 2015-10-03 DIAGNOSIS — M9901 Segmental and somatic dysfunction of cervical region: Secondary | ICD-10-CM | POA: Diagnosis not present

## 2015-10-03 DIAGNOSIS — M50322 Other cervical disc degeneration at C5-C6 level: Secondary | ICD-10-CM | POA: Diagnosis not present

## 2015-10-03 DIAGNOSIS — M9902 Segmental and somatic dysfunction of thoracic region: Secondary | ICD-10-CM | POA: Diagnosis not present

## 2015-10-03 DIAGNOSIS — M5137 Other intervertebral disc degeneration, lumbosacral region: Secondary | ICD-10-CM | POA: Diagnosis not present

## 2015-10-03 DIAGNOSIS — M9904 Segmental and somatic dysfunction of sacral region: Secondary | ICD-10-CM | POA: Diagnosis not present

## 2015-10-03 DIAGNOSIS — Q72812 Congenital shortening of left lower limb: Secondary | ICD-10-CM | POA: Diagnosis not present

## 2015-10-03 DIAGNOSIS — M5384 Other specified dorsopathies, thoracic region: Secondary | ICD-10-CM | POA: Diagnosis not present

## 2015-10-03 DIAGNOSIS — M9905 Segmental and somatic dysfunction of pelvic region: Secondary | ICD-10-CM | POA: Diagnosis not present

## 2015-10-04 ENCOUNTER — Other Ambulatory Visit: Payer: Self-pay | Admitting: Cardiovascular Disease

## 2015-10-04 DIAGNOSIS — I10 Essential (primary) hypertension: Secondary | ICD-10-CM

## 2015-10-05 NOTE — Telephone Encounter (Signed)
Last office visit has this listed as three times a week which is different from what is being requested. I dont see where it was changed. Please advise. Thanks, MI

## 2015-10-10 DIAGNOSIS — M9905 Segmental and somatic dysfunction of pelvic region: Secondary | ICD-10-CM | POA: Diagnosis not present

## 2015-10-10 DIAGNOSIS — M50322 Other cervical disc degeneration at C5-C6 level: Secondary | ICD-10-CM | POA: Diagnosis not present

## 2015-10-10 DIAGNOSIS — M5384 Other specified dorsopathies, thoracic region: Secondary | ICD-10-CM | POA: Diagnosis not present

## 2015-10-10 DIAGNOSIS — Q72812 Congenital shortening of left lower limb: Secondary | ICD-10-CM | POA: Diagnosis not present

## 2015-10-10 DIAGNOSIS — M9904 Segmental and somatic dysfunction of sacral region: Secondary | ICD-10-CM | POA: Diagnosis not present

## 2015-10-10 DIAGNOSIS — M9901 Segmental and somatic dysfunction of cervical region: Secondary | ICD-10-CM | POA: Diagnosis not present

## 2015-10-10 DIAGNOSIS — M9902 Segmental and somatic dysfunction of thoracic region: Secondary | ICD-10-CM | POA: Diagnosis not present

## 2015-10-10 DIAGNOSIS — M9903 Segmental and somatic dysfunction of lumbar region: Secondary | ICD-10-CM | POA: Diagnosis not present

## 2015-10-10 DIAGNOSIS — M5137 Other intervertebral disc degeneration, lumbosacral region: Secondary | ICD-10-CM | POA: Diagnosis not present

## 2015-10-17 DIAGNOSIS — M9902 Segmental and somatic dysfunction of thoracic region: Secondary | ICD-10-CM | POA: Diagnosis not present

## 2015-10-17 DIAGNOSIS — M5137 Other intervertebral disc degeneration, lumbosacral region: Secondary | ICD-10-CM | POA: Diagnosis not present

## 2015-10-17 DIAGNOSIS — Q72812 Congenital shortening of left lower limb: Secondary | ICD-10-CM | POA: Diagnosis not present

## 2015-10-17 DIAGNOSIS — M9901 Segmental and somatic dysfunction of cervical region: Secondary | ICD-10-CM | POA: Diagnosis not present

## 2015-10-17 DIAGNOSIS — M9905 Segmental and somatic dysfunction of pelvic region: Secondary | ICD-10-CM | POA: Diagnosis not present

## 2015-10-17 DIAGNOSIS — M50322 Other cervical disc degeneration at C5-C6 level: Secondary | ICD-10-CM | POA: Diagnosis not present

## 2015-10-17 DIAGNOSIS — M5384 Other specified dorsopathies, thoracic region: Secondary | ICD-10-CM | POA: Diagnosis not present

## 2015-10-17 DIAGNOSIS — M9904 Segmental and somatic dysfunction of sacral region: Secondary | ICD-10-CM | POA: Diagnosis not present

## 2015-10-17 DIAGNOSIS — M9903 Segmental and somatic dysfunction of lumbar region: Secondary | ICD-10-CM | POA: Diagnosis not present

## 2015-10-31 DIAGNOSIS — M50322 Other cervical disc degeneration at C5-C6 level: Secondary | ICD-10-CM | POA: Diagnosis not present

## 2015-10-31 DIAGNOSIS — M9903 Segmental and somatic dysfunction of lumbar region: Secondary | ICD-10-CM | POA: Diagnosis not present

## 2015-10-31 DIAGNOSIS — M9904 Segmental and somatic dysfunction of sacral region: Secondary | ICD-10-CM | POA: Diagnosis not present

## 2015-10-31 DIAGNOSIS — M9905 Segmental and somatic dysfunction of pelvic region: Secondary | ICD-10-CM | POA: Diagnosis not present

## 2015-10-31 DIAGNOSIS — M5137 Other intervertebral disc degeneration, lumbosacral region: Secondary | ICD-10-CM | POA: Diagnosis not present

## 2015-10-31 DIAGNOSIS — Q72812 Congenital shortening of left lower limb: Secondary | ICD-10-CM | POA: Diagnosis not present

## 2015-10-31 DIAGNOSIS — M5384 Other specified dorsopathies, thoracic region: Secondary | ICD-10-CM | POA: Diagnosis not present

## 2015-10-31 DIAGNOSIS — M9902 Segmental and somatic dysfunction of thoracic region: Secondary | ICD-10-CM | POA: Diagnosis not present

## 2015-10-31 DIAGNOSIS — M9901 Segmental and somatic dysfunction of cervical region: Secondary | ICD-10-CM | POA: Diagnosis not present

## 2015-11-15 DIAGNOSIS — L820 Inflamed seborrheic keratosis: Secondary | ICD-10-CM | POA: Diagnosis not present

## 2015-11-15 DIAGNOSIS — L570 Actinic keratosis: Secondary | ICD-10-CM | POA: Diagnosis not present

## 2015-11-21 DIAGNOSIS — M9903 Segmental and somatic dysfunction of lumbar region: Secondary | ICD-10-CM | POA: Diagnosis not present

## 2015-11-21 DIAGNOSIS — Q72812 Congenital shortening of left lower limb: Secondary | ICD-10-CM | POA: Diagnosis not present

## 2015-11-21 DIAGNOSIS — M50322 Other cervical disc degeneration at C5-C6 level: Secondary | ICD-10-CM | POA: Diagnosis not present

## 2015-11-21 DIAGNOSIS — M5384 Other specified dorsopathies, thoracic region: Secondary | ICD-10-CM | POA: Diagnosis not present

## 2015-11-21 DIAGNOSIS — M9904 Segmental and somatic dysfunction of sacral region: Secondary | ICD-10-CM | POA: Diagnosis not present

## 2015-11-21 DIAGNOSIS — M9901 Segmental and somatic dysfunction of cervical region: Secondary | ICD-10-CM | POA: Diagnosis not present

## 2015-11-21 DIAGNOSIS — M9902 Segmental and somatic dysfunction of thoracic region: Secondary | ICD-10-CM | POA: Diagnosis not present

## 2015-11-21 DIAGNOSIS — M5137 Other intervertebral disc degeneration, lumbosacral region: Secondary | ICD-10-CM | POA: Diagnosis not present

## 2015-11-21 DIAGNOSIS — M9905 Segmental and somatic dysfunction of pelvic region: Secondary | ICD-10-CM | POA: Diagnosis not present

## 2015-11-26 ENCOUNTER — Ambulatory Visit: Payer: PPO | Admitting: Internal Medicine

## 2015-11-27 ENCOUNTER — Telehealth: Payer: Self-pay | Admitting: Cardiovascular Disease

## 2015-11-27 NOTE — Telephone Encounter (Signed)
New message  Pt call requesting to speak with RN. Pt did not want to disclose any further information of call. Please call back to discuss

## 2015-11-27 NOTE — Telephone Encounter (Signed)
Pt advised to hold amlodipine until we hear from Dr Acie Fredrickson.

## 2015-11-27 NOTE — Telephone Encounter (Signed)
LMTCB

## 2015-11-27 NOTE — Telephone Encounter (Signed)
Pt states BP has been running low the last few times it has been checked. Pt states today at [facility] BP 88/55, pulse 58. Pt states about a month ago at another doctor BP 105/62.

## 2015-11-27 NOTE — Telephone Encounter (Signed)
Pt states he also has lack of energy. Pt denies other symptoms, states he is staying well hydrated, has not been sick. Pt does not have other BP readings, does not have a BP cuff at home.  Pt advised I will forward to Dr Acie Fredrickson for review.

## 2015-11-28 NOTE — Telephone Encounter (Signed)
Continue to hold Amlodipine  I may need to see him in the next several days if he does not feel better.

## 2015-11-28 NOTE — Telephone Encounter (Signed)
Spoke with patient who states he remains fatigued; states he has not been very active today.  I advised him to continue to hold amlodipine and that Dr. Acie Fredrickson would like to see him in the office soon.  He is scheduled for office visit tomorrow, 10/5 at 1:30 pm.  He thanked me for the call.

## 2015-11-29 ENCOUNTER — Encounter (INDEPENDENT_AMBULATORY_CARE_PROVIDER_SITE_OTHER): Payer: Self-pay

## 2015-11-29 ENCOUNTER — Ambulatory Visit (INDEPENDENT_AMBULATORY_CARE_PROVIDER_SITE_OTHER): Payer: PPO | Admitting: Cardiovascular Disease

## 2015-11-29 ENCOUNTER — Encounter: Payer: Self-pay | Admitting: Cardiovascular Disease

## 2015-11-29 VITALS — BP 116/64 | HR 67 | Ht 73.0 in | Wt 258.8 lb

## 2015-11-29 DIAGNOSIS — I10 Essential (primary) hypertension: Secondary | ICD-10-CM | POA: Diagnosis not present

## 2015-11-29 DIAGNOSIS — I2510 Atherosclerotic heart disease of native coronary artery without angina pectoris: Secondary | ICD-10-CM | POA: Diagnosis not present

## 2015-11-29 NOTE — Progress Notes (Signed)
Cardiology Office Note   Date:  11/29/2015   ID:  Brent [name], DOB 1931/08/17, MRN [id number]  PCP:  [name], MD  Cardiologist:   Mertie Moores, MD   Chief Complaint  Patient presents with  . Coronary Artery Disease   1. CAD / CABG - 2000 2. HTN 3. Hyperlipidemia  History of Present Illness:  Brent [name] is 82 y.o. gentleman with a hx of CAD/CABG, hyperlipidemia, HTN. He has not had any chest pain. He has been having lots of left hip pain. He had a steroid injection which helped quite a bit but it appears that the injection has now worn off.  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  Feb. 24, 2014:  He has been having some dyspnea with exercise. He has lots of orthopedic problems. He is exercising some some but not as much as he would like.  Nov. 6 ,2014:  Brent [name] is doing well. He recently had a laser treatment for an esophageal issue. No cardiac issues. Feeling well. Still working on a [name] . He has lost 10 lbs.   September 21, 2013:  Brent [name] is doing well. Has had a right shoulder replacement.  Feb. 17, 2016:  Brent [name] is a 82 y.o. male who presents for follow up of his dyspnea. He has been getting more short of breath over the past several weeks.  Was having DOE climbing up an incline.  He was seen by Nicki Reaper in clinic. He was started on Lasix and feels much better. He has decreased the lasix to twice a week. .  Follow-up Myoview was low risk .Overall Impression: Low risk stress nuclear study With no ischemia identified. Possible apical infarct pattern. Decreased sensitivity secondary to bowel loop attenuation..  LV Ejection Fraction: 51%. LV Wall Motion: Septal wall hypokinesis with otherwise normal LV function  Not having any chest pain   Jul 14, 2014:  Brent [name] is a 82 y.o. male who presents for follow up of his CAD  Having some bronchitis.  No CP . Still working on his antique car. Not exercising much     Jul 16, 2015  Doing well. No cardiac problems  Chronic back problems. Has COPD   Oct. 5, 2017:  Has been having low energy.   BP was low.  Amlodipine has been held  BP is normal today    Past Medical History:  Diagnosis Date  . AAA (abdominal aortic aneurysm) ([place])   . Arthritis   . Back pain   . CAD (coronary artery disease)    Status post CABG  . Dizziness    takes Antivert daily  . Erectile dysfunction   . GERD (gastroesophageal reflux disease)    takes Omeprazole daily  . History of blood clots    to the lungs   . HTN (hypertension)    takes Amlodipine and Micardis daily  . Hx of cardiovascular stress test    Lexiscan Myoview (2/16):  Possible apical infarct, no ischemia, EF 51%; Low Risk  . Hyperlipidemia    takes Crestor daily  . Myocardial infarction   . Pulmonary embolism [facility])     Past Surgical History:  Procedure Laterality Date  . Broken Ankle    . Broken Jaw     20+ years ago  . CARDIOVASCULAR STRESS TEST  04-28-2005   EF 53%  . CORONARY ARTERY BYPASS GRAFT  1998   there are sequential 90% stenosis in the proximal LAD, and the  mid and distal LAD is a moderate sized vessel. The mid and first diagonal branch has a 50-60% stenosis.   Marland Kitchen ESOPHAGOSCOPY  06/25/2011   Procedure: ESOPHAGOSCOPY;  Surgeon: Izora Gala, MD;  Location: [facility];  Service: ENT;  Laterality: N/A;  Direct Esophagoscopy  . TONSILLECTOMY    . TOTAL SHOULDER ARTHROPLASTY Right 06/02/2013   Procedure: RIGHT TOTAL SHOULDER ARTHROPLASTY;  Surgeon: Marin Shutter, MD;  Location: [facility];  Service: Orthopedics;  Laterality: Right;     Current Outpatient Prescriptions  Medication Sig Dispense Refill  . albuterol (PROAIR HFA) 108 (90 BASE) MCG/ACT inhaler Inhale 2 puffs into the lungs every 6 (six) hours as needed for wheezing or shortness of breath.    Marland Kitchen amLODipine (NORVASC) 2.5 MG tablet TAKE 1 TABLET BY MOUTH ONCE DAILY 30 tablet 10  . aspirin EC 81 MG tablet Take 81 mg by mouth daily.    .  budesonide-formoterol (SYMBICORT) 160-4.5 MCG/ACT inhaler Take 2 puffs first thing in am and then another 2 puffs about 12 hours later. (Patient taking differently: Take 1 puffs first thing in am and then another 2 puffs about 12 hours later.) 1 Inhaler 11  . diphenhydrAMINE (ALLERGY) 25 MG tablet Take 25 mg by mouth 2 (two) times daily.    . furosemide (LASIX) 40 MG tablet Take 1 tablet (40 mg total) by mouth 3 (three) times a week. 30 tablet 11  . Glucosamine-Chondroit-Vit C-Mn (GLUCOSAMINE 1500 COMPLEX PO) Take 1 tablet by mouth 2 (two) times daily.    . meclizine (ANTIVERT) 25 MG tablet Take 25 mg by mouth every morning.    . Multiple Vitamins-Minerals (MENS 50+ ADVANCED PO) Take 1 tablet by mouth daily.    . nitroGLYCERIN (NITROSTAT) 0.4 MG SL tablet Place 1 tablet (0.4 mg total) under the tongue every 5 (five) minutes as needed. For chest pain 25 tablet 6  . pantoprazole (PROTONIX) 40 MG tablet TAKE 1 TABLET BY MOUTH TWICE DAILY. TAKE 30-60 MINUTES BEFORE THE FIRST MEALOF THE DAY 60 tablet 3  . potassium chloride (K-DUR) 10 MEQ tablet TAKE 1 TABLET BY MOUTH 3 TIMES A WEEK, MONDAY, WEDNESDAY, AND FRIDAY 30 tablet 11  . rosuvastatin (CRESTOR) 40 MG tablet Take 1 tablet (40 mg total) by mouth daily. 30 tablet 11  . telmisartan-hydrochlorothiazide (MICARDIS HCT) 80-25 MG per tablet TAKE 1 TABLET BY MOUTH DAILY 90 tablet 2  . traMADol (ULTRAM) 50 MG tablet Take 50 mg by mouth every 6 (six) hours as needed. pain    . vitamin C (ASCORBIC ACID) 500 MG tablet Take 1,000 mg by mouth daily.     No current facility-administered medications for this visit.     Allergies:   Morphine and Morphine and related    Social History:  The patient  reports that he quit smoking about 39 years ago. His smoking use included Cigarettes. He has a 20.00 pack-year smoking history. He has never used smokeless tobacco. He reports that he drinks about 0.6 oz of alcohol per week . He reports that he does not use drugs.    Family History:  The patient's family history includes Breast cancer in his mother; Heart attack in his mother; Stroke in his mother.    ROS:  Please see the history of present illness.    Review of Systems: Constitutional:  denies fever, chills, diaphoresis, appetite change and fatigue.  HEENT: denies photophobia, eye pain, redness, hearing loss, ear pain, congestion, sore throat, rhinorrhea, sneezing, neck pain, neck stiffness and  tinnitus.  Respiratory: denies SOB, DOE, cough, chest tightness, and wheezing.  Cardiovascular: denies chest pain, palpitations and leg swelling.  Gastrointestinal: denies nausea, vomiting, abdominal pain, diarrhea, constipation, blood in stool.  Genitourinary: denies dysuria, urgency, frequency, hematuria, flank pain and difficulty urinating.  Musculoskeletal: denies  myalgias, back pain, joint swelling, arthralgias and gait problem.   Skin: denies pallor, rash and wound.  Neurological: denies dizziness, seizures, syncope, weakness, light-headedness, numbness and headaches.   Hematological: denies adenopathy, easy bruising, personal or family bleeding history.  Psychiatric/ Behavioral: denies suicidal ideation, mood changes, confusion, nervousness, sleep disturbance and agitation.      All other systems are reviewed and negative.    PHYSICAL EXAM: VS:  BP 116/64 (BP Location: Left Arm, Patient Position: Sitting, Cuff Size: Large)   Pulse 67   Ht 6\' 1"  (1.854 m)   Wt 258 lb 12.8 oz (117.4 kg)   BMI 34.14 kg/m  , BMI Body mass index is 34.14 kg/m. GEN: Well nourished, well developed, in no acute distress  HEENT: normal  Neck: no JVD, carotid bruits, or masses Cardiac: RRR; no murmurs, rubs, or gallops,no edema  Respiratory:  clear to auscultation bilaterally, normal work of breathing GI: soft, nontender, nondistended, + BS MS: no deformity or atrophy  Skin: warm and dry, no rash Neuro:  Strength and sensation are intact Psych: normal   EKG:   EKG is ordered today. Oct. 5, 2017:   Sinus rhythm with 1st degree AV block    Recent Labs: 06-05-202017: ALT 15; BUN 34; Creat 1.54; Potassium 4.5; Sodium 138    Lipid Panel    Component Value Date/Time   CHOL 130 006-05-202017 1553   TRIG 185 (H) 006-05-202017 1553   HDL 33 (L) 006-05-202017 1553   CHOLHDL 3.9 006-05-202017 1553   VLDL 37 (H) 006-05-202017 1553   LDLCALC 60 006-05-202017 1553      Wt Readings from Last 3 Encounters:  11/29/15 258 lb 12.8 oz (117.4 kg)  07/24/15 258 lb (117 kg)  07/16/15 260 lb 12.8 oz (118.3 kg)      Other studies Reviewed: Additional studies/ records that were reviewed today include: . Review of the above records demonstrates:    ASSESSMENT AND PLAN:  1. CAD / CABG - 2000 - doing well. No angina.  Continue same meds Not able to walk much due to back pain .  2. HTN- BP is ok , better off the amlodipine Will continue without the amlodipine  Will see him in several months   3. Hyperlipidemia -    Will see in 1 year with repeat fasting labs.    Current medicines are reviewed at length with the patient today.  The patient does not have concerns regarding medicines.  The following changes have been made:  no change  Labs/ tests ordered today include:  No orders of the defined types were placed in this encounter.    Disposition:   FU with me in several months     Mertie Moores, MD  11/29/2015 1:46 PM    [facility] [place], [place], [place]  [address] Phone: [phone]; Fax: [phone]   [facility]  [address] [place],   [address] [phone]    Fax [phone]

## 2015-11-29 NOTE — Patient Instructions (Signed)

## 2015-12-03 ENCOUNTER — Other Ambulatory Visit: Payer: Self-pay | Admitting: Cardiovascular Disease

## 2016-01-24 ENCOUNTER — Other Ambulatory Visit: Payer: Self-pay | Admitting: Internal Medicine

## 2016-01-24 DIAGNOSIS — J449 Chronic obstructive pulmonary disease, unspecified: Secondary | ICD-10-CM | POA: Diagnosis not present

## 2016-01-24 DIAGNOSIS — M545 Low back pain: Secondary | ICD-10-CM | POA: Diagnosis not present

## 2016-01-24 DIAGNOSIS — M542 Cervicalgia: Secondary | ICD-10-CM | POA: Diagnosis not present

## 2016-01-24 DIAGNOSIS — Z23 Encounter for immunization: Secondary | ICD-10-CM | POA: Diagnosis not present

## 2016-01-24 DIAGNOSIS — I714 Abdominal aortic aneurysm, without rupture: Secondary | ICD-10-CM | POA: Diagnosis not present

## 2016-01-24 DIAGNOSIS — N183 Chronic kidney disease, stage 3 (moderate): Secondary | ICD-10-CM | POA: Diagnosis not present

## 2016-03-25 DIAGNOSIS — G2581 Restless legs syndrome: Secondary | ICD-10-CM | POA: Diagnosis not present

## 2016-03-25 DIAGNOSIS — N183 Chronic kidney disease, stage 3 (moderate): Secondary | ICD-10-CM | POA: Diagnosis not present

## 2016-03-25 DIAGNOSIS — M542 Cervicalgia: Secondary | ICD-10-CM | POA: Diagnosis not present

## 2016-03-25 DIAGNOSIS — I714 Abdominal aortic aneurysm, without rupture: Secondary | ICD-10-CM | POA: Diagnosis not present

## 2016-03-25 DIAGNOSIS — J449 Chronic obstructive pulmonary disease, unspecified: Secondary | ICD-10-CM | POA: Diagnosis not present

## 2016-03-25 DIAGNOSIS — M545 Low back pain: Secondary | ICD-10-CM | POA: Diagnosis not present

## 2016-03-25 DIAGNOSIS — Z23 Encounter for immunization: Secondary | ICD-10-CM | POA: Diagnosis not present

## 2016-03-27 ENCOUNTER — Other Ambulatory Visit: Payer: Self-pay | Admitting: Internal Medicine

## 2016-03-27 DIAGNOSIS — L570 Actinic keratosis: Secondary | ICD-10-CM | POA: Diagnosis not present

## 2016-05-03 ENCOUNTER — Other Ambulatory Visit: Payer: Self-pay | Admitting: Cardiovascular Disease

## 2016-05-10 ENCOUNTER — Other Ambulatory Visit: Payer: Self-pay | Admitting: Internal Medicine

## 2016-05-27 ENCOUNTER — Encounter: Payer: Self-pay | Admitting: Cardiovascular Disease

## 2016-05-27 DIAGNOSIS — L570 Actinic keratosis: Secondary | ICD-10-CM | POA: Diagnosis not present

## 2016-05-27 DIAGNOSIS — L820 Inflamed seborrheic keratosis: Secondary | ICD-10-CM | POA: Diagnosis not present

## 2016-06-11 ENCOUNTER — Encounter: Payer: Self-pay | Admitting: Cardiovascular Disease

## 2016-06-11 ENCOUNTER — Ambulatory Visit (INDEPENDENT_AMBULATORY_CARE_PROVIDER_SITE_OTHER): Payer: PPO | Admitting: Cardiovascular Disease

## 2016-06-11 ENCOUNTER — Encounter (INDEPENDENT_AMBULATORY_CARE_PROVIDER_SITE_OTHER): Payer: Self-pay

## 2016-06-11 VITALS — BP 114/68 | HR 55 | Wt 257.2 lb

## 2016-06-11 DIAGNOSIS — I2510 Atherosclerotic heart disease of native coronary artery without angina pectoris: Secondary | ICD-10-CM | POA: Diagnosis not present

## 2016-06-11 DIAGNOSIS — R0602 Shortness of breath: Secondary | ICD-10-CM

## 2016-06-11 DIAGNOSIS — I5022 Chronic systolic (congestive) heart failure: Secondary | ICD-10-CM | POA: Diagnosis not present

## 2016-06-11 LAB — COMPREHENSIVE METABOLIC PANEL
ALBUMIN: 4 g/dL (ref 3.5–4.7)
ALK PHOS: 82 IU/L (ref 39–117)
ALT: 16 IU/L (ref 0–44)
AST: 22 IU/L (ref 0–40)
Albumin/Globulin Ratio: 1.5 (ref 1.2–2.2)
BILIRUBIN TOTAL: 0.5 mg/dL (ref 0.0–1.2)
BUN / CREAT RATIO: 20 (ref 10–24)
BUN: 29 mg/dL — AB (ref 8–27)
CALCIUM: 9 mg/dL (ref 8.6–10.2)
CHLORIDE: 102 mmol/L (ref 96–106)
CO2: 23 mmol/L (ref 18–29)
CREATININE: 1.48 mg/dL — AB (ref 0.76–1.27)
GFR calc Af Amer: 50 mL/min/{1.73_m2} — ABNORMAL LOW (ref >59)
GFR calc non Af Amer: 43 mL/min/{1.73_m2} — ABNORMAL LOW (ref >59)
GLUCOSE: 100 mg/dL — AB (ref 65–99)
Globulin, Total: 2.6 g/dL (ref 1.5–4.5)
Potassium: 5 mmol/L (ref 3.5–5.2)
Sodium: 142 mmol/L (ref 134–144)
TOTAL PROTEIN: 6.6 g/dL (ref 6.0–8.5)

## 2016-06-11 LAB — LIPID PANEL
CHOLESTEROL TOTAL: 159 mg/dL (ref 100–199)
Chol/HDL Ratio: 4.3 ratio (ref 0.0–5.0)
HDL: 37 mg/dL — ABNORMAL LOW (ref >39)
LDL Calculated: 87 mg/dL (ref 0–99)
TRIGLYCERIDES: 177 mg/dL — AB (ref 0–149)
VLDL CHOLESTEROL CAL: 35 mg/dL (ref 5–40)

## 2016-06-11 NOTE — Progress Notes (Signed)
Cardiology Office Note   Date:  06/11/2016   ID:  Brent [name], DOB [birth year], MRN [id number]  PCP:  [name], MD  Cardiologist:   Brent Moores, MD   Chief Complaint  Patient presents with  . Office Visit    cad   1. CAD / CABG - 2000 2. HTN 3. Hyperlipidemia  History of Present Illness:  Brent [name] is 83 y.o. gentleman with a hx of CAD/CABG, hyperlipidemia, HTN. He has not had any chest pain. He has been having lots of left hip pain. He had a steroid injection which helped quite a bit but it appears that the injection has now worn off.  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  Feb. 24, 2014:  He has been having some dyspnea with exercise. He has lots of orthopedic problems. He is exercising some some but not as much as he would like.  Nov. 6 ,2014:  Brent [name] is doing well. He recently had a laser treatment for an esophageal issue. No cardiac issues. Feeling well. Still working on a [name] . He has lost 10 lbs.   September 21, 2013:  Brent [name] is doing well. Has had a right shoulder replacement.  Feb. 17, 2016:  Brent [name] is a 83 y.o. male who presents for follow up of his dyspnea. He has been getting more short of breath over the past several weeks.  Was having DOE climbing up an incline.  He was seen by Brent [name] in clinic. He was started on Lasix and feels much better. He has decreased the lasix to twice a week. .  Follow-up Myoview was low risk .Overall Impression: Low risk stress nuclear study With no ischemia identified. Possible apical infarct pattern. Decreased sensitivity secondary to bowel loop attenuation..  LV Ejection Fraction: 51%. LV Wall Motion: Septal wall hypokinesis with otherwise normal LV function  Not having any chest pain   Jul 14, 2014:  Brent [name] is a 83 y.o. male who presents for follow up of his CAD  Having some bronchitis.  No CP . Still working on his antique car. Not exercising much    Jul 16, 2015  Doing well. No cardiac problems  Chronic back problems. Has COPD   Oct. 5, 2017:  Has been having low energy.   BP was low.  Amlodipine has been held  BP is normal today   June 11, 2016:  BP has been good.   Past Medical History:  Diagnosis Date  . AAA (abdominal aortic aneurysm) ([place])   . Arthritis   . Back pain   . CAD (coronary artery disease)    Status post CABG  . Dizziness    takes Antivert daily  . Erectile dysfunction   . GERD (gastroesophageal reflux disease)    takes Omeprazole daily  . History of blood clots    to the lungs   . HTN (hypertension)    takes Amlodipine and Micardis daily  . Hx of cardiovascular stress test    Lexiscan Myoview (2/16):  Possible apical infarct, no ischemia, EF 51%; Low Risk  . Hyperlipidemia    takes Crestor daily  . Myocardial infarction ([place])   . Pulmonary embolism [facility])     Past Surgical History:  Procedure Laterality Date  . Broken Ankle    . Broken Jaw     20+ years ago  . CARDIOVASCULAR STRESS TEST  04-28-2005   EF 53%  . [place]  there are sequential 90% stenosis in the proximal LAD, and the mid and distal LAD is a moderate sized vessel. The mid and first diagonal branch has a 50-60% stenosis.   Marland Kitchen ESOPHAGOSCOPY  06/25/2011   Procedure: ESOPHAGOSCOPY;  Surgeon: Izora Gala, MD;  Location: [facility];  Service: ENT;  Laterality: N/A;  Direct Esophagoscopy  . TONSILLECTOMY    . TOTAL SHOULDER ARTHROPLASTY Right 06/02/2013   Procedure: RIGHT TOTAL SHOULDER ARTHROPLASTY;  Surgeon: Marin Shutter, MD;  Location: [facility];  Service: Orthopedics;  Laterality: Right;     Current Outpatient Prescriptions  Medication Sig Dispense Refill  . albuterol (PROAIR HFA) 108 (90 BASE) MCG/ACT inhaler Inhale 2 puffs into the lungs every 6 (six) hours as needed for wheezing or shortness of breath.    Marland Kitchen aspirin EC 81 MG tablet Take 81 mg by mouth daily.    . diphenhydrAMINE (ALLERGY) 25 MG tablet  Take 25 mg by mouth 2 (two) times daily.    . furosemide (LASIX) 40 MG tablet Take 1 tablet (40 mg total) by mouth 3 (three) times a week. 30 tablet 11  . Glucosamine-Chondroit-Vit C-Mn (GLUCOSAMINE 1500 COMPLEX PO) Take 1 tablet by mouth 2 (two) times daily.    . meclizine (ANTIVERT) 25 MG tablet Take 25 mg by mouth every morning.    . Multiple Vitamins-Minerals (MENS 50+ ADVANCED PO) Take 1 tablet by mouth daily.    . nitroGLYCERIN (NITROSTAT) 0.4 MG SL tablet Place 1 tablet (0.4 mg total) under the tongue every 5 (five) minutes as needed. For chest pain 25 tablet 6  . pantoprazole (PROTONIX) 40 MG tablet TAKE 1 TABLET BY MOUTH TWICE DAILY. TAKE 30-60 MINUTES BEFORE THE FIRST MEALOF THE DAY 60 tablet 3  . pantoprazole (PROTONIX) 40 MG tablet TAKE 1 TABLET BY MOUTH TWICE DAILY 60 tablet 2  . potassium chloride (K-DUR) 10 MEQ tablet TAKE 1 TABLET BY MOUTH 3 TIMES PER WEEK,MONDAY, WEDNESDAY, AND FRIDAY 36 tablet 1  . rosuvastatin (CRESTOR) 40 MG tablet Take 1 tablet (40 mg total) by mouth daily. 30 tablet 11  . SYMBICORT 160-4.5 MCG/ACT inhaler INHALE 2 PUFFS FIRST THING IN THE MORNING AND ANOTHER 2 PUFFS ABOUT 12 HOURS LATER 10.2 g 3  . telmisartan-hydrochlorothiazide (MICARDIS HCT) 80-25 MG tablet TAKE 1 TABLET BY MOUTH ONCE DAILY 90 tablet 3  . traMADol (ULTRAM) 50 MG tablet Take 50 mg by mouth every 6 (six) hours as needed. pain    . vitamin C (ASCORBIC ACID) 500 MG tablet Take 1,000 mg by mouth daily.     No current facility-administered medications for this visit.     Allergies:   Morphine and Morphine and related    Social History:  The patient  reports that he quit smoking about 40 years ago. His smoking use included Cigarettes. He has a 20.00 pack-year smoking history. He has never used smokeless tobacco. He reports that he drinks about 0.6 oz of alcohol per week . He reports that he does not use drugs.   Family History:  The patient's family history includes Breast cancer in his  mother; Heart attack in his mother; Stroke in his mother.    ROS:  Please see the history of present illness.    Review of Systems: Constitutional:  denies fever, chills, diaphoresis, appetite change and fatigue.  HEENT: denies photophobia, eye pain, redness, hearing loss, ear pain, congestion, sore throat, rhinorrhea, sneezing, neck pain, neck stiffness and tinnitus.  Respiratory: denies SOB, DOE, cough, chest tightness, and  wheezing.  Cardiovascular: denies chest pain, palpitations and leg swelling.  Gastrointestinal: denies nausea, vomiting, abdominal pain, diarrhea, constipation, blood in stool.  Genitourinary: denies dysuria, urgency, frequency, hematuria, flank pain and difficulty urinating.  Musculoskeletal: denies  myalgias, back pain, joint swelling, arthralgias and gait problem.   Skin: denies pallor, rash and wound.  Neurological: denies dizziness, seizures, syncope, weakness, light-headedness, numbness and headaches.   Hematological: denies adenopathy, easy bruising, personal or family bleeding history.  Psychiatric/ Behavioral: denies suicidal ideation, mood changes, confusion, nervousness, sleep disturbance and agitation.      All other systems are reviewed and negative.    PHYSICAL EXAM: VS:  BP 114/68   Pulse (!) 55   Wt 257 lb 4 oz (116.7 kg)   SpO2 98%   BMI 33.94 kg/m  , BMI Body mass index is 33.94 kg/m. GEN: Well nourished, well developed, in no acute distress  HEENT: normal  Neck: no JVD, carotid bruits, or masses Cardiac: RRR; no murmurs, rubs, or gallops,no edema  Respiratory:  Rales bilaterally, Left > right ( has COPD )  GI: soft, nontender, nondistended, + BS MS: no deformity or atrophy  Skin: warm and dry, no rash Neuro:  Strength and sensation are intact Psych: normal   EKG:  EKG is ordered today. Oct. 5, 2017:   Sinus rhythm with 1st degree AV block    Recent Labs: 2020-11-415: ALT 15; BUN 34; Creat 1.54; Potassium 4.5; Sodium 138     Lipid Panel    Component Value Date/Time   CHOL 130 02020-11-415 1553   TRIG 185 (H) 02020-11-415 1553   HDL 33 (L) 02020-11-415 1553   CHOLHDL 3.9 02020-11-415 1553   VLDL 37 (H) 02020-11-415 1553   LDLCALC 60 02020-11-415 1553      Wt Readings from Last 3 Encounters:  06/11/16 257 lb 4 oz (116.7 kg)  11/29/15 258 lb 12.8 oz (117.4 kg)  07/24/15 258 lb (117 kg)      Other studies Reviewed: Additional studies/ records that were reviewed today include: . Review of the above records demonstrates:    ASSESSMENT AND PLAN:  1. CAD / CABG - 2000 - doing well. No angina.  Continue same meds Not able to walk much due to back pain .  2. HTN- BP is ok ,  Will see him in several months   3. Hyperlipidemia -    Will see in 6 months  with repeat fasting labs.   4.   HX of chronic systolic CHF:   EF was low many years ago  Will repeat echo - he has some rales on exam.  He denies any fever.  Has had a cough recently - some yellowish sputum. Has known COPD.    Current medicines are reviewed at length with the patient today.  The patient does not have concerns regarding medicines.  The following changes have been made:  no change  Labs/ tests ordered today include:  No orders of the defined types were placed in this encounter.    Disposition:   FU with me in 6 months     Brent Moores, MD  06/11/2016 10:22 AM    [facility] [place], [place], [place]  [address] Phone: [phone]; Fax: [phone]

## 2016-06-11 NOTE — Patient Instructions (Signed)
Medication Instructions:  Your physician recommends that you continue on your current medications as directed. Please refer to the Current Medication list given to you today.   Labwork: TODAY - cholesterol, complete metabolic panel   Testing/Procedures: Your physician has requested that you have an echocardiogram. Echocardiography is a painless test that uses sound waves to create images of your heart. It provides your doctor with information about the size and shape of your heart and how well your heart's chambers and valves are working. This procedure takes approximately one hour. There are no restrictions for this procedure.    Follow-Up: Your physician wants you to follow-up in: 6 months with Dr. Acie Fredrickson.  You will receive a reminder letter in the mail two months in advance. If you don't receive a letter, please call our office to schedule the follow-up appointment.   If you need a refill on your cardiac medications before your next appointment, please call your pharmacy.   Thank you for choosing [facility]! Christen Bame, RN [phone]

## 2016-06-17 DIAGNOSIS — E7800 Pure hypercholesterolemia, unspecified: Secondary | ICD-10-CM | POA: Diagnosis not present

## 2016-06-17 DIAGNOSIS — M545 Low back pain: Secondary | ICD-10-CM | POA: Diagnosis not present

## 2016-06-17 DIAGNOSIS — G2581 Restless legs syndrome: Secondary | ICD-10-CM | POA: Diagnosis not present

## 2016-06-17 DIAGNOSIS — R197 Diarrhea, unspecified: Secondary | ICD-10-CM | POA: Diagnosis not present

## 2016-06-17 DIAGNOSIS — N183 Chronic kidney disease, stage 3 (moderate): Secondary | ICD-10-CM | POA: Diagnosis not present

## 2016-06-17 DIAGNOSIS — Z0000 Encounter for general adult medical examination without abnormal findings: Secondary | ICD-10-CM | POA: Diagnosis not present

## 2016-06-17 DIAGNOSIS — I129 Hypertensive chronic kidney disease with stage 1 through stage 4 chronic kidney disease, or unspecified chronic kidney disease: Secondary | ICD-10-CM | POA: Diagnosis not present

## 2016-06-17 DIAGNOSIS — Z1389 Encounter for screening for other disorder: Secondary | ICD-10-CM | POA: Diagnosis not present

## 2016-06-17 DIAGNOSIS — I5022 Chronic systolic (congestive) heart failure: Secondary | ICD-10-CM | POA: Diagnosis not present

## 2016-06-17 DIAGNOSIS — M542 Cervicalgia: Secondary | ICD-10-CM | POA: Diagnosis not present

## 2016-06-17 DIAGNOSIS — J449 Chronic obstructive pulmonary disease, unspecified: Secondary | ICD-10-CM | POA: Diagnosis not present

## 2016-06-17 DIAGNOSIS — R05 Cough: Secondary | ICD-10-CM | POA: Diagnosis not present

## 2016-06-18 ENCOUNTER — Ambulatory Visit (HOSPITAL_COMMUNITY): Payer: PPO | Attending: Cardiology

## 2016-06-18 ENCOUNTER — Other Ambulatory Visit: Payer: Self-pay

## 2016-06-18 DIAGNOSIS — E785 Hyperlipidemia, unspecified: Secondary | ICD-10-CM | POA: Insufficient documentation

## 2016-06-18 DIAGNOSIS — E669 Obesity, unspecified: Secondary | ICD-10-CM | POA: Insufficient documentation

## 2016-06-18 DIAGNOSIS — I119 Hypertensive heart disease without heart failure: Secondary | ICD-10-CM | POA: Insufficient documentation

## 2016-06-18 DIAGNOSIS — J449 Chronic obstructive pulmonary disease, unspecified: Secondary | ICD-10-CM | POA: Diagnosis not present

## 2016-06-18 DIAGNOSIS — Z87891 Personal history of nicotine dependence: Secondary | ICD-10-CM | POA: Diagnosis not present

## 2016-06-18 DIAGNOSIS — I2510 Atherosclerotic heart disease of native coronary artery without angina pectoris: Secondary | ICD-10-CM | POA: Diagnosis not present

## 2016-06-18 DIAGNOSIS — R0602 Shortness of breath: Secondary | ICD-10-CM

## 2016-06-18 DIAGNOSIS — I371 Nonrheumatic pulmonary valve insufficiency: Secondary | ICD-10-CM | POA: Diagnosis not present

## 2016-06-18 DIAGNOSIS — I071 Rheumatic tricuspid insufficiency: Secondary | ICD-10-CM | POA: Insufficient documentation

## 2016-06-18 DIAGNOSIS — I5022 Chronic systolic (congestive) heart failure: Secondary | ICD-10-CM | POA: Insufficient documentation

## 2016-06-18 DIAGNOSIS — Z6833 Body mass index (BMI) 33.0-33.9, adult: Secondary | ICD-10-CM | POA: Diagnosis not present

## 2016-06-27 ENCOUNTER — Telehealth: Payer: Self-pay | Admitting: Cardiovascular Disease

## 2016-06-27 NOTE — Telephone Encounter (Signed)
New Message   pt verbalized that he is calling for rn   Pt c/o BP issue: STAT if pt c/o blurred vision, one-sided weakness or slurred speech  1. What are your last 5 BP readings? 100/60  2. Are you having any other symptoms (ex. Dizziness, headache, blurred vision, passed out)? weak  3. What is your BP issue? low

## 2016-06-27 NOTE — Telephone Encounter (Signed)
Spoke to patient - c/o no energy for the last 10 days-since last office visit.  today's blood pressure 100/60. No chest pain ,or discomfort , some shortness of breathe  - but usual.dizziness   , positive sinus issues, no fever. Patient drinks about 45-60 oz fluid a day.   lasix dose 40 mg every m-w-f,,patient  takes micardis - hct 80/25 daily.  RN reassured patient. Blood pressure did not change very much since last visit 114/68 on 06/11/16. Instructed to hold micardis/hct  for 2 days ( today and tomorrow)   will defer to Dr Cathie Olden further instruction . Patient aware

## 2016-06-29 NOTE — Telephone Encounter (Signed)
Agree with plan to hold micardis / HCTZ for several days Will check back with him to see how he is feeling

## 2016-07-10 NOTE — Telephone Encounter (Signed)
Called patient to see how he is feeling. He states he is feeling better and has resumed Micardis; states he only held it for 2 days. We discussed appropriate water and gatorade consumption.  He thanked me for the call.

## 2016-07-26 ENCOUNTER — Other Ambulatory Visit: Payer: Self-pay | Admitting: Internal Medicine

## 2016-08-12 ENCOUNTER — Other Ambulatory Visit: Payer: Self-pay | Admitting: Cardiovascular Disease

## 2016-08-12 MED ORDER — ROSUVASTATIN CALCIUM 40 MG PO TABS: 40.0000 mg | ORAL_TABLET | Freq: Every day | ORAL | 9 refills | Status: DC

## 2016-08-18 ENCOUNTER — Other Ambulatory Visit: Payer: Self-pay | Admitting: Internal Medicine

## 2016-08-19 ENCOUNTER — Telehealth: Payer: Self-pay | Admitting: Internal Medicine

## 2016-08-19 ENCOUNTER — Other Ambulatory Visit: Payer: Self-pay | Admitting: Internal Medicine

## 2016-08-19 MED ORDER — PANTOPRAZOLE SODIUM 40 MG PO TBEC: 40.0000 mg | DELAYED_RELEASE_TABLET | Freq: Two times a day (BID) | ORAL | 0 refills | Status: DC

## 2016-08-19 NOTE — Telephone Encounter (Signed)
Spoke with pt. He is needing a refill on Pantoprazole. Pt has pending OV with MW on 09/12/16. Rx has been sent in to last until this appointment. Nothing further was needed.

## 2016-09-12 ENCOUNTER — Ambulatory Visit: Payer: PPO | Admitting: Internal Medicine

## 2016-09-12 ENCOUNTER — Telehealth: Payer: Self-pay | Admitting: Cardiovascular Disease

## 2016-09-12 NOTE — Telephone Encounter (Signed)
I spoke with pt. He reports some dizziness when standing.  Not every time he stands.  Yesterday while outside he stood up and felt dizzy. Daughter helped him to chair.  Did not pass out.  Took BP yesterday afternoon at [facility] and it was 90/60.  Checks periodically at other pharmacies and readings have been similar. Reports he fell about a month ago while going to kitchen.  Did not pass out.   I instructed pt to hold telmisartan/HCTZ for the next 5-6 days and to check BP daily.  I asked him to call us next week with readings and update on how he is feeling. Pt is trying to stay hydrated. Orthostatic precautions discussed with pt.

## 2016-09-12 NOTE — Telephone Encounter (Signed)
Agree with holding Telmasartan / HCTZ  He may not need that medication at this point He will call us next week with BP

## 2016-09-12 NOTE — Telephone Encounter (Signed)
New message       Pt c/o BP issue: STAT if pt c/o blurred vision, one-sided weakness or slurred speech  1. What are your last 5 BP readings?  90/60 yesterday at [facility] 2. Are you having any other symptoms (ex. Dizziness, headache, blurred vision, passed out)?  Dizzy when he goes from a sitting to standing position.    3. What is your BP issue?  Bp is low.  Pt states that has fallen twice.

## 2016-09-17 NOTE — Telephone Encounter (Signed)
Spoke with patient who reports the following BP readings: 7/20 131/59 mmHg @ 1645, 119/54 mmHg @ 2050 7/21 117/84 mmHg @ 0930, 137/58 mmHg @ 2045 7/22 113/67 mmHg @ 0721, 149/51 mmHg @ 2145 7/23 114/53 mmHg  @ 1200 7/24 112/45 mmHg @ 1200 7/25 120/57 mmHg @ 0830  He states his dizziness has resolved and he feels well. I advised him to continue to hold the telmisartan/HCT and that I will check with Dr. Acie Fredrickson on Monday to determine if he wants him to take anything in the place of this medication. Patient reports he will be out of town from this afternoon until Monday. I advised him to call back before Monday if dizziness returns and/or if BP becomes elevated.  He verbalized understanding and agreement and thanked me for the call.

## 2016-09-17 NOTE — Telephone Encounter (Signed)
Follow Up:    Pt was instructed on last Friday to call back and talk to the nurse this week. Pt is supposed to give you an update on how he is feelings and his readings.i

## 2016-09-22 NOTE — Telephone Encounter (Signed)
Continue to hold the telmisartan / HCT for now These BP readings look good

## 2016-09-23 NOTE — Telephone Encounter (Signed)
Advised patient to continue current medications, that Dr. Acie Fredrickson does not want to add additional medication in the place of the Micardis. Patient states he is feeling well, no dizziness or other concerns. He states BP is monitored daily and remains stable. He thanked me for the call.

## 2016-09-26 DIAGNOSIS — M25511 Pain in right shoulder: Secondary | ICD-10-CM | POA: Diagnosis not present

## 2016-09-26 DIAGNOSIS — Z471 Aftercare following joint replacement surgery: Secondary | ICD-10-CM | POA: Diagnosis not present

## 2016-09-26 DIAGNOSIS — Z96611 Presence of right artificial shoulder joint: Secondary | ICD-10-CM | POA: Diagnosis not present

## 2016-09-26 DIAGNOSIS — M542 Cervicalgia: Secondary | ICD-10-CM | POA: Diagnosis not present

## 2016-09-29 ENCOUNTER — Encounter: Payer: Self-pay | Admitting: Internal Medicine

## 2016-09-29 ENCOUNTER — Ambulatory Visit (INDEPENDENT_AMBULATORY_CARE_PROVIDER_SITE_OTHER): Payer: PPO | Admitting: Internal Medicine

## 2016-09-29 VITALS — BP 120/74 | HR 76 | Ht 73.0 in | Wt 249.0 lb

## 2016-09-29 DIAGNOSIS — R058 Other specified cough: Secondary | ICD-10-CM

## 2016-09-29 DIAGNOSIS — J8410 Pulmonary fibrosis, unspecified: Secondary | ICD-10-CM

## 2016-09-29 DIAGNOSIS — R05 Cough: Secondary | ICD-10-CM

## 2016-09-29 DIAGNOSIS — J449 Chronic obstructive pulmonary disease, unspecified: Secondary | ICD-10-CM

## 2016-09-29 MED ORDER — BUDESONIDE-FORMOTEROL FUMARATE 160-4.5 MCG/ACT IN AERO: INHALATION_SPRAY | RESPIRATORY_TRACT | 11 refills | Status: DC

## 2016-09-29 MED ORDER — PANTOPRAZOLE SODIUM 40 MG PO TBEC
40.0000 mg | DELAYED_RELEASE_TABLET | Freq: Two times a day (BID) | ORAL | 11 refills | Status: DC
Start: 2016-09-29 — End: 2017-04-13

## 2016-09-29 MED ORDER — BUDESONIDE-FORMOTEROL FUMARATE 160-4.5 MCG/ACT IN AERO: INHALATION_SPRAY | RESPIRATORY_TRACT | 0 refills | Status: DC

## 2016-09-29 NOTE — Progress Notes (Signed)
Subjective:    Patient ID: Brent [name], male    DOB: 1931/10/23,    MRN: [id number]    Brief patient profile:  52  yowm quit smoking in 1978 with cabg late 1990's  No trouble breathing until around summer 2015 then cough onset x 05/2014 with neg cardiac w/u by nishan> referred 09/11/2014  by [name] with PF on CT 08/23/14      History of Present Illness  09/11/2014 1st [facility] office visit/ Wert   Chief Complaint  Patient presents with  . Pulmonary Consult    Referred by [name]. Pt c/o cough, wheezing and SOB for the past 2 months. He states he gets SOB with walking 100 yrds. His cough is mainly non prod but will occ produce some clear sputum.   dyspnea first symptom he noted  x one year just with exertion indolent onset / minimally progressive then coughing x 2-3 months assoc with hb  And since onset of cough having more sob.   Cough not better with saba/ some better p pred/breo rec Try prilosec otc 20mg  x 2  Take 30-60 min before first meal of the day and Pepcid ac (famotidine) 20 mg one @  bedtime until return (over the counter) GERD diet      04/23/2015  f/u ov/Wert re: PF/ gold III codp/ symbiort 160  Chief Complaint  Patient presents with  . Follow-up    Still coughing with occ very light yellow mucus, SOB. Pt denies any wheezing , cp/tightness.  cough tends to be worse after supper despite ppi q am / sleeps ok at hs  No change doe = MMRC1 = can walk nl pace, flat grade, can't hurry or go uphills or steps s sob   rec Try Protonix (pantoprazole) 40 mg Take 30- 60 min before your first and last meals of the day and leave off the pepcid for now Try reduce symbicort 160 one twice daily to see what difference if any this makes with the breathing or coughing   07/24/2015  f/u ov/Wert re:  PF/ GOLD III copd/ symb 160 one bid  Chief Complaint  Patient presents with  . Follow-up    Pt states his cough and SOB have improved some. He has not had to use proair.     limited more by back/leg pain than limiting sob which if anything is better on symb one bid  rec Try off symbicort Agree with rheumatology evaluation but prefer let Reade coordinate and advise on use of advil/alleve or alternatives Follow up in Oct 2017 for pfts - call sooner if breathing worse back on symbicort at 2 every 12 hours         09/29/2016  f/u ov/Wert re:  GOLD III copd/  "only here for refills"  Missed appt for pfts Chief Complaint  Patient presents with  . Follow-up    Pt states here for refills on pantoprazole and symbciort. He states his breathing is doing well. He has not had to use albuterol.    doe = MMRC1 = can walk nl pace, flat grade, can't hurry or go uphills or steps s sob    No obvious day to day or daytime variability or assoc excess/ purulent sputum or mucus plugs or hemoptysis or cp or chest tightness, subjective wheeze or overt sinus or hb symptoms as long as he takes ppi bid ac (much worse HB and cough when tries to wean off) . No unusual exp  hx or h/o childhood pna/ asthma or knowledge of premature birth.  Sleeping ok without nocturnal  or early am exacerbation  of respiratory  c/o's or need for noct saba. Also denies any obvious fluctuation of symptoms with weather or environmental changes or other aggravating or alleviating factors except as outlined above   Current Medications, Allergies, Complete Past Medical History, Past Surgical History, Family History, and Social History were reviewed in [employer] record.  ROS  The following are not active complaints unless bolded sore throat, dysphagia, dental problems, itching, sneezing,  nasal congestion or excess/ purulent secretions, ear ache,   fever, chills, sweats, unintended wt loss, classically pleuritic or exertional cp,  orthopnea pnd or leg swelling, presyncope, palpitations, abdominal pain, anorexia, nausea, vomiting, diarrhea  or change in bowel or bladder habits, change in  stools or urine, dysuria,hematuria,  rash, arthralgias, visual complaints, headache, numbness, weakness or ataxia or problems with walking or coordination,  change in mood/affect or memory.                       Objective:   Physical Exam  amb  wm nad   10/23/2014       250 >248 11/14/2014 > 12/04/2014    248  > 04/23/2015   261 > 07/24/2015  258 > 09/29/2016   249   Vital signs reviewed  - Note on arrival 02 sats  97% on RA      HEENT: nl dentition, turbinates, and orophanx. Nl external ear canals without cough reflex Poor dentition    NECK :  without JVD/Nodes/TM/ nl carotid upstrokes bilaterally No stridor    LUNGS: no acc muscle use,    Bibasilar insp  crackles, scattered rhonchi on exp bilaterally     CV:  RRR  no s3 or murmur or increase in P2, no edema   ABD:  soft and nontender with nl excursion in the supine position. No bruits or organomegaly, bowel sounds nl  MS:  warm without deformities, calf tenderness, cyanosis or clubbing - yellow nails "all my life, just like my dad"   SKIN: warm and dry without lesions    NEURO:  alert, approp, no deficits                Assessment & Plan:

## 2016-09-29 NOTE — Patient Instructions (Addendum)
Resume symbicort 1-2 puffs every 12 hours  Work on inhaler technique:  relax and gently blow all the way out then take a nice smooth deep breath back in, triggering the inhaler at same time you start breathing in.  Hold for up to 5 seconds if you can. Blow out thru nose. Rinse and gargle with water when done     Protonix Take 30- 60 min before your first and last meals of the day   Please schedule a follow up visit in 3 months but call sooner if needed with pfts on return

## 2016-09-30 NOTE — Assessment & Plan Note (Signed)
PFT's 12/04/2014 with ERV 20 c/w effects of obesity on lung vols  Body mass index is 32.85 kg/m.  -  trending down, encouraged Lab Results  Component Value Date   TSH 3.86 03/28/2014     Contributing to gerd risk/ doe/reviewed the need and the process to achieve and maintain neg calorie balance > defer f/u primary care including intermittently monitoring thyroid status

## 2016-09-30 NOTE — Assessment & Plan Note (Signed)
PFT's 12/04/2014 mostly restrictive but ratio 64% so quite mild relative to PF component  - 12/04/2014   75% so try symbicort 160 2bid  - 04/23/2015  extensive coaching HFA effectiveness =    90% from a baseline of 75%  - 07/24/2015 try off symbicort at pt's request > can take up to 2 pffs q 12h prn   - 09/29/2016  After extensive coaching HFA effectiveness =    75%   Despite suboptimal technique he has minimal symptoms so no change in rx    I reviewed the Fletcher curve with the patient that basically indicates  if you quit smoking when your best day FEV1 is still well preserved (as is clearly  the case here)  it is highly unlikely you will progress to severe disease and informed the patient there was  no medication on the market that has proven to alter the curve/ its downward trajectory  or the likelihood of progression of their disease(unlike other chronic medical conditions such as atheroclerosis where we do think we can change the natural hx with risk reducing meds)    Therefore symptom control with prn symbicort is fine as long as he's not having exacerbations and maintains off cigs indefinitely are the only recs I have for now  I had an extended discussion with the patient reviewing all relevant studies completed to date and  lasting 15 to 20 minutes of a 25 minute visit    Each maintenance medication was reviewed in detail including most importantly the difference between maintenance and prns and under what circumstances the prns are to be triggered using an action plan format that is not reflected in the computer generated alphabetically organized AVS.    Please see AVS for specific instructions unique to this visit that I personally wrote and verbalized to the the pt in detail and then reviewed with pt  by my nurse highlighting any  changes in therapy recommended at today's visit to their plan of care.

## 2016-09-30 NOTE — Assessment & Plan Note (Signed)
Improved on ppi, much worse when he stops assoc with overt HB  > continue bid ac

## 2016-09-30 NOTE — Assessment & Plan Note (Signed)
-   HRCT 08/23/14  1. Pulmonary parenchymal pattern of fibrosis is likely due to usual interstitial pneumonitis (UIP). 2. Enlarged pulmonary arteries, indicative of pulmonary arterial Hypertension. -09/11/2014  Walked RA x 3 laps @ 185 ft each stopped due to End of study, nl pace, no sob or desat  - PFT's  12/04/2014  FEV1 1.39 (43 % ) ratio 64  p no % improvement from saba with DLCO  53 % corrects to 111 % for alv volume  And ERV 20 - 04/23/2015  Walked RA x 3 laps @ 185 ft each stopped due to  End of study, nl pace, no sob or desat    Continues to have minimal symptoms but needs to return for pfts to complete the w/u and consider whether to start antifibrotic therapy if declined in lung function is documented  Discussed in detail all the  indications, usual  risks and alternatives  relative to the benefits with patient who agrees to proceed with w/u  as outlined

## 2016-10-10 DIAGNOSIS — M542 Cervicalgia: Secondary | ICD-10-CM | POA: Diagnosis not present

## 2016-10-21 ENCOUNTER — Other Ambulatory Visit (HOSPITAL_COMMUNITY): Payer: PPO

## 2016-10-21 ENCOUNTER — Ambulatory Visit: Payer: PPO | Admitting: Vascular Surgery

## 2016-10-29 DIAGNOSIS — M25511 Pain in right shoulder: Secondary | ICD-10-CM | POA: Diagnosis not present

## 2016-10-29 DIAGNOSIS — Z96611 Presence of right artificial shoulder joint: Secondary | ICD-10-CM | POA: Diagnosis not present

## 2016-10-29 DIAGNOSIS — M542 Cervicalgia: Secondary | ICD-10-CM | POA: Diagnosis not present

## 2016-10-30 DIAGNOSIS — H5200 Hypermetropia, unspecified eye: Secondary | ICD-10-CM | POA: Diagnosis not present

## 2016-10-30 DIAGNOSIS — H2513 Age-related nuclear cataract, bilateral: Secondary | ICD-10-CM | POA: Diagnosis not present

## 2016-10-30 DIAGNOSIS — H52229 Regular astigmatism, unspecified eye: Secondary | ICD-10-CM | POA: Diagnosis not present

## 2016-10-30 DIAGNOSIS — H1045 Other chronic allergic conjunctivitis: Secondary | ICD-10-CM | POA: Diagnosis not present

## 2016-10-30 DIAGNOSIS — H524 Presbyopia: Secondary | ICD-10-CM | POA: Diagnosis not present

## 2016-10-30 DIAGNOSIS — H25033 Anterior subcapsular polar age-related cataract, bilateral: Secondary | ICD-10-CM | POA: Diagnosis not present

## 2016-11-18 ENCOUNTER — Ambulatory Visit (HOSPITAL_COMMUNITY)
Admission: RE | Admit: 2016-11-18 | Discharge: 2016-11-18 | Disposition: A | Discharge status: Home / Self Care | Payer: PPO | Source: Ambulatory Visit | Attending: Vascular Surgery | Admitting: Vascular Surgery

## 2016-11-18 ENCOUNTER — Ambulatory Visit: Payer: PPO | Admitting: Vascular Surgery

## 2016-11-18 ENCOUNTER — Other Ambulatory Visit (HOSPITAL_COMMUNITY): Payer: PPO

## 2016-11-18 DIAGNOSIS — I714 Abdominal aortic aneurysm, without rupture, unspecified: Secondary | ICD-10-CM

## 2016-11-26 DIAGNOSIS — F5221 Male erectile disorder: Secondary | ICD-10-CM | POA: Diagnosis not present

## 2016-11-26 DIAGNOSIS — N471 Phimosis: Secondary | ICD-10-CM | POA: Diagnosis not present

## 2016-11-26 DIAGNOSIS — N433 Hydrocele, unspecified: Secondary | ICD-10-CM | POA: Diagnosis not present

## 2016-12-01 DIAGNOSIS — N433 Hydrocele, unspecified: Secondary | ICD-10-CM | POA: Diagnosis not present

## 2016-12-02 ENCOUNTER — Other Ambulatory Visit: Payer: Self-pay | Admitting: *Deleted

## 2016-12-02 ENCOUNTER — Telehealth: Payer: Self-pay | Admitting: Cardiovascular Disease

## 2016-12-02 MED ORDER — NITROGLYCERIN 0.4 MG SL SUBL: 0.4000 mg | SUBLINGUAL_TABLET | SUBLINGUAL | 3 refills | Status: DC | PRN

## 2016-12-02 NOTE — Telephone Encounter (Signed)
New message       Patient lost medication. Refill needed   *STAT* If patient is at the pharmacy, call can be transferred to refill team.   1. Which medications need to be refilled? (please list name of each medication and dose if known) nitroGLYCERIN (NITROSTAT) 0.4 MG SL tablet  2. Which pharmacy/location (including street and city if local pharmacy) is medication to be sent to? [facility]  3. Do they need a 30 day or 90 day supply? [place]

## 2016-12-15 ENCOUNTER — Other Ambulatory Visit: Payer: Self-pay | Admitting: Cardiovascular Disease

## 2016-12-15 DIAGNOSIS — I10 Essential (primary) hypertension: Secondary | ICD-10-CM

## 2016-12-16 ENCOUNTER — Encounter: Payer: Self-pay | Admitting: Vascular Surgery

## 2016-12-16 ENCOUNTER — Ambulatory Visit (INDEPENDENT_AMBULATORY_CARE_PROVIDER_SITE_OTHER): Payer: PPO | Admitting: Vascular Surgery

## 2016-12-16 DIAGNOSIS — I713 Abdominal aortic aneurysm, ruptured, unspecified: Secondary | ICD-10-CM

## 2016-12-16 NOTE — Progress Notes (Signed)
Vascular and Vein Specialist of [facility]  Patient name: Brent [name] MRN: [id number] DOB: November 22, 1931 Sex: male  REASON FOR VISIT: Follow-up for asymptomatic abdominal aortic aneurysm  HPI: Brent [name] is a 83 y.o. male here for follow-up of his abdominal aortic aneurysm.  He underwent ultrasound last week.  He denies any symptoms referable to this.  He reports that he has a feeling of generalized diminished energy and increased shortness of breath.  He has had no specific difficulties that she is cardiac disease or other difficulty  Past Medical History:  Diagnosis Date  . AAA (abdominal aortic aneurysm) ([place])   . Arthritis   . Back pain   . CAD (coronary artery disease)    Status post CABG  . COPD (chronic obstructive pulmonary disease) ([place])   . Dizziness    takes Antivert daily  . Erectile dysfunction   . GERD (gastroesophageal reflux disease)    takes Omeprazole daily  . History of blood clots    to the lungs   . HTN (hypertension)    takes Amlodipine and Micardis daily  . Hx of cardiovascular stress test    Lexiscan Myoview (2/16):  Possible apical infarct, no ischemia, EF 51%; Low Risk  . Hyperlipidemia    takes Crestor daily  . Myocardial infarction ([place])   . Pulmonary embolism (H[facility])     Family History  Problem Relation Age of Onset  . Heart attack Mother   . Stroke Mother   . Breast cancer Mother     SOCIAL HISTORY: Social History  Substance Use Topics  . Smoking status: Former Smoker    Packs/day: 1.00    Years: 20.00    Types: Cigarettes    Quit date: 02/25/1976  . Smokeless tobacco: Never Used  . Alcohol use 0.6 oz/week    1 Glasses of wine per week     Comment: occ    Allergies  Allergen Reactions  . Morphine Itching and Hives  . Morphine And Related     Itch     Current Outpatient Prescriptions  Medication Sig Dispense Refill  . albuterol (PROAIR HFA) 108 (90 BASE) MCG/ACT inhaler Inhale 2 puffs into  the lungs every 6 (six) hours as needed for wheezing or shortness of breath.    Marland Kitchen aspirin EC 81 MG tablet Take 81 mg by mouth daily.    . budesonide-formoterol (SYMBICORT) 160-4.5 MCG/ACT inhaler INHALE 2 PUFFS FIRST THING IN THE MORNING AND ANOTHER 2 PUFFS ABOUT 12 HOURS LATER 10.2 g 11  . diphenhydrAMINE (ALLERGY) 25 MG tablet Take 25 mg by mouth 2 (two) times daily.    . furosemide (LASIX) 40 MG tablet TAKE 1 TABLET BY MOUTH 3 TIMES A WEEK 30 tablet 0  . Glucosamine-Chondroit-Vit C-Mn (GLUCOSAMINE 1500 COMPLEX PO) Take 1 tablet by mouth 2 (two) times daily.    . meclizine (ANTIVERT) 25 MG tablet Take 25 mg by mouth every morning.    . Multiple Vitamins-Minerals (MENS 50+ ADVANCED PO) Take 1 tablet by mouth daily.    . nitroGLYCERIN (NITROSTAT) 0.4 MG SL tablet Place 1 tablet (0.4 mg total) under the tongue every 5 (five) minutes as needed. For chest pain 25 tablet 3  . pantoprazole (PROTONIX) 40 MG tablet Take 1 tablet (40 mg total) by mouth 2 (two) times daily. 60 tablet 11  . potassium chloride (K-DUR) 10 MEQ tablet TAKE 1 TABLET BY MOUTH 3 TIMES PER WEEK,MONDAY, WEDNESDAY, AND FRIDAY 36 tablet 1  . rosuvastatin (CRESTOR)  40 MG tablet Take 1 tablet (40 mg total) by mouth daily. 30 tablet 9  . vitamin C (ASCORBIC ACID) 500 MG tablet Take 1,000 mg by mouth daily.     No current facility-administered medications for this visit.     REVIEW OF SYSTEMS:  [X]  denotes positive finding, [ ]  denotes negative finding Cardiac  Comments:  Chest pain or chest pressure:    Shortness of breath upon exertion: x   Short of breath when lying flat:    Irregular heart rhythm:        Vascular    Pain in calf, thigh, or hip brought on by ambulation:    Pain in feet at night that wakes you up from your sleep:     Blood clot in your veins:    Leg swelling:           PHYSICAL EXAM: Vitals:   12/16/16 0937 12/16/16 0941  BP: (!) 191/84 (!) 190/86  Pulse: (!) 55   Resp: 18   Temp: 97.7 F (36.5 C)     TempSrc: Oral   SpO2: 96%   Weight: 255 lb (115.7 kg)   Height: 6\' 1"  (1.854 m)     GENERAL: The patient is a well-nourished male, in no acute distress. The vital signs are documented above. CARDIOVASCULAR: 2+ radial 2+ dorsalis pedis pulses bilaterally Moderate obesity no and PULMONARY: There is good air exchange  MUSCULOSKELETAL: There are no major deformities or cyanosis. NEUROLOGIC: No focal weakness or paresthesias are detected. SKIN: There are no ulcers or rashes noted. PSYCHIATRIC: The patient has a normal affect.  DATA:  Her sound from 11/18/2016 showed maximal diameter of 5.2 cm.  This compares to CT scan of 4.1 cm in 2016  MEDICAL ISSUES: I discussed these findings at length with the patient.  He has had significant growth according to ultrasound compared to a CT scan from 2 years ago.  I have recommended CT angiogram for further evaluation to indeed actual rapid growth of his aneurysm.  We will see him back in the office following the CT scan for further discussion    Rosetta Posner, MD [facility] Vascular and Vein Specialists of [facility] Tel [phone] Pager [phone]

## 2016-12-19 DIAGNOSIS — I739 Peripheral vascular disease, unspecified: Secondary | ICD-10-CM | POA: Diagnosis not present

## 2016-12-19 DIAGNOSIS — R279 Unspecified lack of coordination: Secondary | ICD-10-CM | POA: Diagnosis not present

## 2016-12-19 DIAGNOSIS — Z23 Encounter for immunization: Secondary | ICD-10-CM | POA: Diagnosis not present

## 2016-12-19 DIAGNOSIS — R269 Unspecified abnormalities of gait and mobility: Secondary | ICD-10-CM | POA: Diagnosis not present

## 2016-12-19 DIAGNOSIS — M545 Low back pain: Secondary | ICD-10-CM | POA: Diagnosis not present

## 2016-12-19 DIAGNOSIS — I2510 Atherosclerotic heart disease of native coronary artery without angina pectoris: Secondary | ICD-10-CM | POA: Diagnosis not present

## 2016-12-19 DIAGNOSIS — R202 Paresthesia of skin: Secondary | ICD-10-CM | POA: Diagnosis not present

## 2016-12-22 ENCOUNTER — Other Ambulatory Visit: Payer: Self-pay | Admitting: Family Medicine

## 2016-12-22 DIAGNOSIS — I739 Peripheral vascular disease, unspecified: Secondary | ICD-10-CM

## 2016-12-23 DIAGNOSIS — R202 Paresthesia of skin: Secondary | ICD-10-CM | POA: Diagnosis not present

## 2016-12-23 DIAGNOSIS — R29898 Other symptoms and signs involving the musculoskeletal system: Secondary | ICD-10-CM | POA: Diagnosis not present

## 2016-12-24 ENCOUNTER — Other Ambulatory Visit: Payer: Self-pay | Admitting: Family Medicine

## 2016-12-24 DIAGNOSIS — R202 Paresthesia of skin: Secondary | ICD-10-CM

## 2016-12-24 DIAGNOSIS — R29898 Other symptoms and signs involving the musculoskeletal system: Secondary | ICD-10-CM

## 2016-12-26 ENCOUNTER — Ambulatory Visit
Admission: RE | Admit: 2016-12-26 | Discharge: 2016-12-26 | Disposition: A | Discharge status: Home / Self Care | Payer: PPO | Source: Ambulatory Visit | Attending: Family Medicine | Admitting: Family Medicine

## 2016-12-26 ENCOUNTER — Other Ambulatory Visit: Payer: PPO

## 2016-12-26 DIAGNOSIS — I70213 Atherosclerosis of native arteries of extremities with intermittent claudication, bilateral legs: Secondary | ICD-10-CM | POA: Diagnosis not present

## 2016-12-26 DIAGNOSIS — R29898 Other symptoms and signs involving the musculoskeletal system: Secondary | ICD-10-CM

## 2016-12-26 DIAGNOSIS — I739 Peripheral vascular disease, unspecified: Secondary | ICD-10-CM

## 2016-12-26 DIAGNOSIS — R202 Paresthesia of skin: Secondary | ICD-10-CM

## 2016-12-26 DIAGNOSIS — M48061 Spinal stenosis, lumbar region without neurogenic claudication: Secondary | ICD-10-CM | POA: Diagnosis not present

## 2016-12-31 ENCOUNTER — Other Ambulatory Visit: Payer: Self-pay | Admitting: Cardiovascular Disease

## 2016-12-31 DIAGNOSIS — I10 Essential (primary) hypertension: Secondary | ICD-10-CM

## 2016-12-31 MED ORDER — FUROSEMIDE 40 MG PO TABS: ORAL_TABLET | ORAL | 0 refills | Status: DC

## 2016-12-31 NOTE — Telephone Encounter (Signed)
Pt called stating that he was out of town and forgot/misplaced his Furosemide 40 mg at home and would like 2 week supply sent to the local [facility] pharmacy.

## 2017-01-05 ENCOUNTER — Encounter: Payer: Self-pay | Admitting: Internal Medicine

## 2017-01-05 ENCOUNTER — Ambulatory Visit (INDEPENDENT_AMBULATORY_CARE_PROVIDER_SITE_OTHER)
Admission: RE | Admit: 2017-01-05 | Discharge: 2017-01-05 | Disposition: A | Discharge status: Home / Self Care | Payer: PPO | Source: Ambulatory Visit | Attending: Internal Medicine | Admitting: Internal Medicine

## 2017-01-05 ENCOUNTER — Ambulatory Visit: Payer: PPO | Admitting: Internal Medicine

## 2017-01-05 VITALS — BP 140/78 | HR 88 | Ht 73.0 in | Wt 239.0 lb

## 2017-01-05 DIAGNOSIS — J8410 Pulmonary fibrosis, unspecified: Secondary | ICD-10-CM | POA: Diagnosis not present

## 2017-01-05 DIAGNOSIS — J449 Chronic obstructive pulmonary disease, unspecified: Secondary | ICD-10-CM | POA: Diagnosis not present

## 2017-01-05 DIAGNOSIS — E669 Obesity, unspecified: Secondary | ICD-10-CM

## 2017-01-05 MED ORDER — BUDESONIDE-FORMOTEROL FUMARATE 160-4.5 MCG/ACT IN AERO: INHALATION_SPRAY | RESPIRATORY_TRACT | 0 refills | Status: DC

## 2017-01-05 NOTE — Patient Instructions (Addendum)
Plan A = Automatic =   Symbicort  160 Take 2 puffs first thing in am and then another 2 puffs about 12 hours later.    Work on inhaler technique:  relax and gently blow all the way out then take a nice smooth deep breath back in, triggering the inhaler at same time you start breathing in.  Hold for up to 5 seconds if you can. Blow out thru nose. Rinse and gargle with water when done      Plan B = Backup Only use your albuterol as a rescue medication to be used if you can't catch your breath by resting or doing a relaxed purse lip breathing pattern.  - The less you use it, the better it will work when you need it. - Ok to use the inhaler up to 2 puffs  every 4 hours if you must but call for appointment if use goes up over your usual need - Don't leave home without it !!  (think of it like the spare tire for your car)    GERD (REFLUX)  is an extremely common cause of respiratory symptoms just like yours , many times with no obvious heartburn at all.    It can be treated with medication, but also with lifestyle changes including elevation of the head of your bed (ideally with 6 inch  bed blocks),  Smoking cessation, avoidance of late meals, excessive alcohol, and avoid fatty foods, chocolate, peppermint, colas, red wine, and acidic juices such as orange juice.  NO MINT OR MENTHOL PRODUCTS SO NO COUGH DROPS   USE SUGARLESS CANDY INSTEAD (Jolley ranchers or Stover's or Life Savers) or even ice chips will also do - the key is to swallow to prevent all throat clearing. NO OIL BASED VITAMINS - use powdered substitutes.   Please schedule a follow up visit in 3 months but call sooner if needed  with  PFTs on return

## 2017-01-05 NOTE — Progress Notes (Signed)
Subjective:    Patient ID: Brent [name], male    DOB: 09-Dec-1931,    MRN: [id number]    Brief patient profile:  80  yowm quit smoking in 1978 with cabg late 1990's  No trouble breathing until around summer 2015 then cough onset x 05/2014 with neg cardiac w/u by nishan> referred 09/11/2014  by [name] with PF on CT 08/23/14 and GOLD III critieria for copd       History of Present Illness  09/11/2014 1st [facility] office visit/ W[facility]   Chief Complaint  Patient presents with  . Pulmonary Consult    Referred by [name]. Pt c/o cough, wheezing and SOB for the past 2 months. He states he gets SOB with walking 100 yrds. His cough is mainly non prod but will occ produce some clear sputum.   dyspnea first symptom he noted  x one year just with exertion indolent onset / minimally progressive then coughing x 2-3 months assoc with hb  And since onset of cough having more sob.   Cough not better with saba/ some better p pred/breo rec Try prilosec otc 20mg  x 2  Take 30-60 min before first meal of the day and Pepcid ac (famotidine) 20 mg one @  bedtime until return (over the counter) GERD diet      04/23/2015  f/u ov/Wert re: PF/ gold III codp/ symbiort 160  Chief Complaint  Patient presents with  . Follow-up    Still coughing with occ very light yellow mucus, SOB. Pt denies any wheezing , cp/tightness.  cough tends to be worse after supper despite ppi q am / sleeps ok at hs  No change doe = MMRC1 = can walk nl pace, flat grade, can't hurry or go uphills or steps s sob   rec Try Protonix (pantoprazole) 40 mg Take 30- 60 min before your first and last meals of the day and leave off the pepcid for now Try reduce symbicort 160 one twice daily to see what difference if any this makes with the breathing or coughing   07/24/2015  f/u ov/Wert re:  PF/ GOLD III copd/ symb 160 one bid  Chief Complaint  Patient presents with  . Follow-up    Pt states his cough and SOB have improved some.  He has not had to use proair.   limited more by back/leg pain than limiting sob which if anything is better on symb one bid  rec Try off symbicort Agree with rheumatology evaluation but prefer let Reade coordinate and advise on use of advil/alleve or alternatives Follow up in Oct 2017 for pfts - call sooner if breathing worse back on symbicort at 2 every 12 hours         09/29/2016  f/u ov/Wert re:  GOLD III copd/  "only here for refills"  Missed appt for pfts Chief Complaint  Patient presents with  . Follow-up    Pt states here for refills on pantoprazole and symbciort. He states his breathing is doing well. He has not had to use albuterol.    doe = MMRC1 = can walk nl pace, flat grade, can't hurry or go uphills or steps s sob   Resume symbicort 1-2 puffs every 12 hours Work on inhaler technique:  Protonix Take 30- 60 min before your first and last meals of the day       01/05/2017  f/u ov/Wert re:  GOLD III copd/ PF  Chief Complaint  Patient presents  with  . Follow-up    Breathing is overall doing well. He is coughing some with white sputum. He states he was txed for bronchitis by his PCP recently.   already finished zpak, / prednisone  Improved/ chronically not taking ppi consistently  D[facility] back to [facility] =1   No obvious patterns in  day to day or daytime variability or assoc e purulent sputum or mucus plugs or hemoptysis or cp or chest tightness, subjective wheeze or overt sinus or hb symptoms. No unusual exp hx or h/o childhood pna/ asthma or knowledge of premature birth.  Sleeping ok flat without nocturnal  or early am exacerbation  of respiratory  c/o's or need for noct saba. Also denies any obvious fluctuation of symptoms with weather or environmental changes or other aggravating or alleviating factors except as outlined above   Current Allergies, Complete Past Medical History, Past Surgical History, Family History, and Social History were reviewed in [employer] record.  ROS  The following are not active complaints unless bolded Hoarseness, sore throat, dysphagia, dental problems, itching, sneezing,  nasal congestion or discharge of excess mucus or purulent secretions, ear ache,   fever, chills, sweats, unintended wt loss or wt gain, classically pleuritic or exertional cp,  orthopnea pnd or leg swelling, presyncope, palpitations, abdominal pain, anorexia, nausea, vomiting, diarrhea  or change in bowel habits or change in bladder habits, change in stools or change in urine, dysuria, hematuria,  rash, arthralgias, visual complaints, headache, numbness, weakness or ataxia or problems with walking or coordination,  change in mood/affect or memory.        Current Meds  Medication Sig  . albuterol (PROAIR HFA) 108 (90 BASE) MCG/ACT inhaler Inhale 2 puffs into the lungs every 6 (six) hours as needed for wheezing or shortness of breath.  Marland Kitchen aspirin EC 81 MG tablet Take 81 mg by mouth daily.  . budesonide-formoterol (SYMBICORT) 160-4.5 MCG/ACT inhaler INHALE 2 PUFFS FIRST THING IN THE MORNING AND ANOTHER 2 PUFFS ABOUT 12 HOURS LATER  . diphenhydrAMINE (ALLERGY) 25 MG tablet Take 25 mg by mouth 2 (two) times daily.  . furosemide (LASIX) 40 MG tablet TAKE 1 TABLET BY MOUTH 3 TIMES A WEEK  . Glucosamine-Chondroit-Vit C-Mn (GLUCOSAMINE 1500 COMPLEX PO) Take 1 tablet by mouth 2 (two) times daily.  . meclizine (ANTIVERT) 25 MG tablet Take 25 mg by mouth every morning.  . Multiple Vitamins-Minerals (MENS 50+ ADVANCED PO) Take 1 tablet by mouth daily.  . nitroGLYCERIN (NITROSTAT) 0.4 MG SL tablet Place 1 tablet (0.4 mg total) under the tongue every 5 (five) minutes as needed. For chest pain  . pantoprazole (PROTONIX) 40 MG tablet Take 1 tablet (40 mg total) by mouth 2 (two) times daily.  . potassium chloride (K-DUR) 10 MEQ tablet TAKE 1 TABLET BY MOUTH 3 TIMES PER WEEK,MONDAY, WEDNESDAY, AND FRIDAY  . rosuvastatin (CRESTOR) 40 MG tablet Take 1 tablet (40  mg total) by mouth daily.  . vitamin C (ASCORBIC ACID) 500 MG tablet Take 1,000 mg by mouth daily.  .                            Objective:   Physical Exam  amb  wm nad   10/23/2014       250 >248 11/14/2014 > 12/04/2014    248  > 04/23/2015   261 > 07/24/2015  258 > 09/29/2016   249 > 01/05/2017  240 > 01/05/2017  240   Vital signs reviewed  - - Note on arrival 02 sats  95% on RA     HEENT: nl  turbinates bilaterally, and oropharynx. Nl external ear canals without cough reflex - poor dentition    NECK :  without JVD/Nodes/TM/ nl carotid upstrokes bilaterally   LUNGS: no acc muscle use,  Nl contour chest  With insp crackles bilaterally s cough on insp or rhonchi/ cough on exp  CV:  RRR  no s3 or murmur or increase in P2, and no edema   ABD:  soft and nontender with nl inspiratory excursion in the supine position. No bruits or organomegaly appreciated, bowel sounds nl  MS:  Nl gait/ ext warm without deformities, calf tenderness, cyanosis or clubbing but  yellow nails "all my life, just like my dad" - confirmed by daughter "ever since she can remember"    No obvious joint restrictions   SKIN: warm and dry without lesions    NEURO:  alert, approp, nl sensorium with  no motor or cerebellar deficits apparent.           CXR PA and Lateral:   01/05/2017 :    I personally reviewed images and agree with radiology impression as follows:   IMPRESSION: Increasing interstitial prominence within the lungs most compatible with chronic interstitial lung disease/ fibrosis.         Assessment & Plan:

## 2017-01-05 NOTE — Progress Notes (Signed)
Called and left a detailed msg with results/recs

## 2017-01-06 ENCOUNTER — Encounter: Payer: Self-pay | Admitting: Internal Medicine

## 2017-01-06 NOTE — Assessment & Plan Note (Signed)
-   HRCT 08/23/14  1. Pulmonary parenchymal pattern of fibrosis is likely due to usual interstitial pneumonitis (UIP). 2. Enlarged pulmonary arteries, indicative of pulmonary arterial Hypertension. -09/11/2014  Walked RA x 3 laps @ 185 ft each stopped due to End of study, nl pace, no sob or desat  - PFT's  12/04/2014  FEV1 1.39 (43 % ) ratio 64  p no % improvement from saba with DLCO  53 % corrects to 111 % for alv volume  And ERV 20 - 04/23/2015  Walked RA x 3 laps @ 185 ft each stopped due to  End of study, nl pace, no sob or desat    cxr suggests worsening ILD but doe not correlate with symptoms which are more typical of COPD/AB so needs to return for PFT's as prev rec and consider repeat HRCT / starting antifibrotic rx   First however, Use of PPI is associated with improved survival time and with decreased radiologic fibrosis per King's study published in AJRCCM vol 184 p1390.  Dec 2011 and also may have other beneficial effects as per the latest review in [place] vol 193 R4431 Jun 20016.  This may not always be cause and effect, but given how universally unimpressive and expensive  all the other  Drugs developed to day  have been for pf,   rec start  rx ppi / diet/ lifestyle modification and f/u with serial walking sats and lung volumes for now to put more points on the curve / establish firm baseline before considering additional measures.

## 2017-01-06 NOTE — Assessment & Plan Note (Signed)
PFT's 12/04/2014 with ERV 20 c/w effects of obesity on lung vols   Body mass index is 31.53 kg/m.  -  trending overall downward, encouraged  Lab Results  Component Value Date   TSH 3.86 03/28/2014     Contributing to gerd risk/ doe/reviewed the need and the process to achieve and maintain neg calorie balance > defer f/u primary care including intermittently monitoring thyroid status

## 2017-01-06 NOTE — Assessment & Plan Note (Signed)
PFT's 12/04/2014 mostly restrictive but ratio 64% so quite mild relative to PF component  - 12/04/2014   75% so try symbicort 160 2bid  - 04/23/2015  extensive coaching HFA effectiveness =    90% from a baseline of 75%  - 07/24/2015 try off symbicort at pt's request > can take up to 2 pffs q 12h prn   - 01/05/2017  After extensive coaching HFA effectiveness =    75%   This is the more treatable of his two chronic conditions but requires more attention to detail in terms of taking meds consistently/correctly and following the gerd diet  see avs for instructions unique to this ov

## 2017-01-08 ENCOUNTER — Other Ambulatory Visit: Payer: Self-pay | Admitting: Cardiovascular Disease

## 2017-01-09 DIAGNOSIS — J209 Acute bronchitis, unspecified: Secondary | ICD-10-CM | POA: Diagnosis not present

## 2017-01-12 ENCOUNTER — Other Ambulatory Visit: Payer: Self-pay | Admitting: Cardiovascular Disease

## 2017-01-12 DIAGNOSIS — I10 Essential (primary) hypertension: Secondary | ICD-10-CM

## 2017-01-20 ENCOUNTER — Encounter: Payer: Self-pay | Admitting: Vascular Surgery

## 2017-01-20 ENCOUNTER — Ambulatory Visit: Payer: PPO | Admitting: Vascular Surgery

## 2017-01-20 ENCOUNTER — Ambulatory Visit
Admission: RE | Admit: 2017-01-20 | Discharge: 2017-01-20 | Disposition: A | Discharge status: Home / Self Care | Payer: PPO | Source: Ambulatory Visit | Attending: Vascular Surgery | Admitting: Vascular Surgery

## 2017-01-20 ENCOUNTER — Other Ambulatory Visit (HOSPITAL_COMMUNITY): Payer: PPO

## 2017-01-20 ENCOUNTER — Ambulatory Visit (INDEPENDENT_AMBULATORY_CARE_PROVIDER_SITE_OTHER): Payer: PPO | Admitting: Vascular Surgery

## 2017-01-20 VITALS — BP 157/78 | HR 61 | Temp 97.1°F | Resp 16 | Ht 73.0 in | Wt 247.0 lb

## 2017-01-20 DIAGNOSIS — I714 Abdominal aortic aneurysm, without rupture: Secondary | ICD-10-CM | POA: Diagnosis not present

## 2017-01-20 DIAGNOSIS — I713 Abdominal aortic aneurysm, ruptured, unspecified: Secondary | ICD-10-CM

## 2017-01-20 MED ORDER — IOPAMIDOL (ISOVUE-370) INJECTION 76%
75.0000 mL | Freq: Once | INTRAVENOUS | Status: AC | PRN
  Administered 2017-01-20: 75 mL via INTRAVENOUS

## 2017-01-20 NOTE — Progress Notes (Signed)
Vascular and Vein Specialist of [facility]  Patient name: Brent [name] MRN: [id number] DOB: Mar 14, 1931 Sex: male  REASON FOR VISIT: Continued discussion regarding abdominal aortic aneurysm  HPI: Brent [name] is a 83 y.o. male here today for continued discussion of his abdominal aortic aneurysm.  I seen him last month with ultrasound suggesting significant increase in size of his aorta to 5.2 cm.  I recommended CT scan which she obtained today and is here today for further discussion.  He is here with his wife and daughter.  He continues to be asymptomatic from the standpoint of his aneurysm  Past Medical History:  Diagnosis Date  . AAA (abdominal aortic aneurysm) ([place])   . Arthritis   . Back pain   . CAD (coronary artery disease)    Status post CABG  . COPD (chronic obstructive pulmonary disease) ([place])   . Dizziness    takes Antivert daily  . Erectile dysfunction   . GERD (gastroesophageal reflux disease)    takes Omeprazole daily  . History of blood clots    to the lungs   . HTN (hypertension)    takes Amlodipine and Micardis daily  . Hx of cardiovascular stress test    Lexiscan Myoview (2/16):  Possible apical infarct, no ischemia, EF 51%; Low Risk  . Hyperlipidemia    takes Crestor daily  . Myocardial infarction ([place])   . Pulmonary embolism ([facility])     Family History  Problem Relation Age of Onset  . Heart attack Mother   . Stroke Mother   . Breast cancer Mother     SOCIAL HISTORY: Social History   Tobacco Use  . Smoking status: Former Smoker    Packs/day: 1.00    Years: 20.00    Pack years: 20.00    Types: Cigarettes    Last attempt to quit: 02/25/1976    Years since quitting: 40.9  . Smokeless tobacco: Never Used  Substance Use Topics  . Alcohol use: Yes    Alcohol/week: 0.6 oz    Types: 1 Glasses of wine per week    Comment: occ    Allergies  Allergen Reactions  . Morphine Itching and Hives  . Morphine And  Related     Itch     Current Outpatient Medications  Medication Sig Dispense Refill  . albuterol (PROAIR HFA) 108 (90 BASE) MCG/ACT inhaler Inhale 2 puffs into the lungs every 6 (six) hours as needed for wheezing or shortness of breath.    Marland Kitchen aspirin EC 81 MG tablet Take 81 mg by mouth daily.    . budesonide-formoterol (SYMBICORT) 160-4.5 MCG/ACT inhaler INHALE 2 PUFFS FIRST THING IN THE MORNING AND ANOTHER 2 PUFFS ABOUT 12 HOURS LATER 10.2 g 0  . diphenhydrAMINE (ALLERGY) 25 MG tablet Take 25 mg by mouth 2 (two) times daily.    . furosemide (LASIX) 40 MG tablet TAKE 1 TABLET BY MOUTH 3 TIMES A WEEK 12 tablet 3  . Glucosamine-Chondroit-Vit C-Mn (GLUCOSAMINE 1500 COMPLEX PO) Take 1 tablet by mouth 2 (two) times daily.    . meclizine (ANTIVERT) 25 MG tablet Take 25 mg by mouth every morning.    . Multiple Vitamins-Minerals (MENS 50+ ADVANCED PO) Take 1 tablet by mouth daily.    . nitroGLYCERIN (NITROSTAT) 0.4 MG SL tablet PLACE 1 TABLET UNDER THE TONGUE EVERY 5 MINUTES AS NEEDED FOR CHEST PAIN UP TO 3 DOSES, IF SYMPTOMS PERSIST CALL 911 25 tablet 3  . pantoprazole (PROTONIX) 40 MG tablet Take  1 tablet (40 mg total) by mouth 2 (two) times daily. 60 tablet 11  . potassium chloride (K-DUR) 10 MEQ tablet TAKE 1 TABLET BY MOUTH 3 TIMES PER WEEK,MONDAY, WEDNESDAY, AND FRIDAY 12 tablet 3  . rosuvastatin (CRESTOR) 40 MG tablet Take 1 tablet (40 mg total) by mouth daily. 30 tablet 9  . vitamin C (ASCORBIC ACID) 500 MG tablet Take 1,000 mg by mouth daily.     No current facility-administered medications for this visit.     REVIEW OF SYSTEMS:  [X]  denotes positive finding, [ ]  denotes negative finding Cardiac  Comments:  Chest pain or chest pressure:    Shortness of breath upon exertion: x   Short of breath when lying flat:    Irregular heart rhythm:        Vascular    Pain in calf, thigh, or hip brought on by ambulation:    Pain in feet at night that wakes you up from your sleep:     Blood clot  in your veins:    Leg swelling:           PHYSICAL EXAM: Vitals:   01/20/17 1519  BP: (!) 157/78  Pulse: 61  Resp: 16  Temp: (!) 97.1 F (36.2 C)  TempSrc: Oral  SpO2: 98%  Weight: 247 lb (112 kg)  Height: 6\' 1"  (1.854 m)    GENERAL: The patient is a well-nourished male, in no acute distress. The vital signs are documented above. PULMONARY: There is good air exchange  MUSCULOSKELETAL: There are no major deformities or cyanosis. NEUROLOGIC: No focal weakness or paresthesias are detected. SKIN: There are no ulcers or rashes noted. PSYCHIATRIC: The patient has a normal affect.  DATA:  I reviewed the CT scan images personally and discussed the findings with the patient and his family.  This reveals maximal diameter of 4.4 cm which compares to 4.1 cm in 2016  MEDICAL ISSUES: I discussed this with patient.  Explained that he had an apparent over interpretation of his aneurysm size by ultrasound.  He is obese.  Explained that the CT is more reliable.  Explained that he is not had any rapid increase in size and also does not have a large aneurysm therefore would be no consideration for treatment.  He does appear to have a relatively short neck of his aorta below the renal arteries and would be a marginal candidate for stent grafting at his current state.  Fortunately he is not to the size that would require consideration for treatment.  We will see him again in 1 year with repeat ultrasound    Rosetta Posner, MD [facility] Specialists of [facility] Tel [phone] Pager [phone]

## 2017-01-21 NOTE — Addendum Note (Signed)
Addended by: Lianne Cure A on: 01/21/2017 10:02 AM   Modules accepted: Orders

## 2017-02-09 DIAGNOSIS — N471 Phimosis: Secondary | ICD-10-CM | POA: Diagnosis not present

## 2017-02-09 DIAGNOSIS — N433 Hydrocele, unspecified: Secondary | ICD-10-CM | POA: Diagnosis not present

## 2017-02-25 ENCOUNTER — Other Ambulatory Visit: Payer: Self-pay | Admitting: Family Medicine

## 2017-02-25 ENCOUNTER — Ambulatory Visit: Payer: PPO | Admitting: Neurology

## 2017-02-25 ENCOUNTER — Encounter: Payer: Self-pay | Admitting: Neurology

## 2017-02-25 ENCOUNTER — Ambulatory Visit
Admission: RE | Admit: 2017-02-25 | Discharge: 2017-02-25 | Disposition: A | Discharge status: Home / Self Care | Payer: PPO | Source: Ambulatory Visit | Attending: Family Medicine | Admitting: Family Medicine

## 2017-02-25 ENCOUNTER — Other Ambulatory Visit: Payer: Self-pay

## 2017-02-25 VITALS — BP 145/80 | HR 91 | Resp 20 | Ht 73.0 in | Wt 241.0 lb

## 2017-02-25 DIAGNOSIS — G629 Polyneuropathy, unspecified: Secondary | ICD-10-CM | POA: Diagnosis not present

## 2017-02-25 DIAGNOSIS — W19XXXA Unspecified fall, initial encounter: Secondary | ICD-10-CM

## 2017-02-25 DIAGNOSIS — G3281 Cerebellar ataxia in diseases classified elsewhere: Secondary | ICD-10-CM | POA: Diagnosis not present

## 2017-02-25 DIAGNOSIS — M542 Cervicalgia: Secondary | ICD-10-CM

## 2017-02-25 DIAGNOSIS — R269 Unspecified abnormalities of gait and mobility: Secondary | ICD-10-CM | POA: Diagnosis not present

## 2017-02-25 DIAGNOSIS — R2681 Unsteadiness on feet: Secondary | ICD-10-CM | POA: Diagnosis not present

## 2017-02-25 DIAGNOSIS — R059 Cough, unspecified: Secondary | ICD-10-CM

## 2017-02-25 DIAGNOSIS — R05 Cough: Secondary | ICD-10-CM

## 2017-02-25 DIAGNOSIS — J449 Chronic obstructive pulmonary disease, unspecified: Secondary | ICD-10-CM | POA: Diagnosis not present

## 2017-02-25 DIAGNOSIS — R5383 Other fatigue: Secondary | ICD-10-CM | POA: Diagnosis not present

## 2017-02-25 NOTE — Progress Notes (Signed)
[facility]  PATIENT: Brent [name] DOB: 09-23-1931  REFERRING DOCTOR OR PCP:  Dr. Alyson Ingles SOURCE: Patient, notes from PCP, reports, lab reports, MRI images on PACS.  _________________________________   HISTORICAL  CHIEF COMPLAINT:  Chief Complaint  Patient presents with  . Extremity Weakness    Weakness bilat legs worsening over the last 8 mos. or so. Shuffling gait.  3-4 falls in the last yr.  last fall 4 days ago--tripped over a throw rug, fell, hit right side of face on carpet, has some carpet burn/fim    HISTORY OF PRESENT ILLNESS:  I had the pleasure seeing you patient, Brent [name], at [facility] for neurologic consultation regarding his aggressive gait and weakness and recent falls  He is an 84 year old man first noted to have difficulty with his gait about 6-7 months ago.   Since the summer, he has had more trouble with his gait and notes steady worsening.Marland Kitchen     He has fallen 4 times since then.   The last fall was 4 days ago and he hit the right cheek but there was no loss of consciousness.   His gait has developed a short stride and wide stance.  He takes about 12 steps to turn 180 degrees.    He feels his legs have been weaker several years and he has more weakness more recently.    He has noted urinary frequency and urgency with occasionall incontinence which seems worse recently.   He had fecal incontinence over the summer once.     He also has noted reduced strength in his arms the last 2 years.    He feels the muscles in his right arm are smaller and weaker.    He has not noted fasciculations.    He has neck pain which has worsened the last few months.    He denies numbness in his arms but notes a little bit of numbness in his feet at times.    He and his family have not noted any change in cognition.   He has mild reduced short term memory.   The physical decline has affected his ability to function in that he could play 9-18 holes  golf over the summer and now could not do one hole.   He does not feel safe going up and down stairs. He is having more trouble getting out of a chair.  I personally reviewed the MRI of the lumbar spine performed 12/26/2016. There are mild multilevel degenerative changes with potential for left L4 nerve root compression due to lateral recess stenosis at L3-L4. Additionally there is mild foraminal narrowing to the right at L4-L5 .  These changes are not significant enough to explain his gait difficulties   REVIEW OF SYSTEMS: Constitutional: No fevers, chills, sweats, or change in appetite Eyes: No visual changes, double vision, eye pain Ear, nose and throat: No hearing loss, ear pain, nasal congestion, sore throat Cardiovascular: No chest pain, palpitations Respiratory: He has some wheezes. She has COPD. Used to be a smoker. He quit 5 years ago. GastrointestinaI: No nausea, vomiting, diarrhea, abdominal pain, has one episode fecal incontinence Genitourinary: He has some urinary frequency and urgency with occasional incontinence.. Musculoskeletal: No neck pain, back pain Integumentary: No rash, pruritus, skin lesions Neurological: as above Psychiatric: No depression at this time.  No anxiety Endocrine: No palpitations, diaphoresis, change in appetite, change in weigh or increased thirst Hematologic/Lymphatic: No anemia, purpura, petechiae. Allergic/Immunologic: No itchy/runny eyes, nasal congestion,  recent allergic reactions, rashes  ALLERGIES: Allergies  Allergen Reactions  . Morphine Itching and Hives  . Morphine And Related     Itch     HOME MEDICATIONS:  Current Outpatient Medications:  .  albuterol (PROAIR HFA) 108 (90 BASE) MCG/ACT inhaler, Inhale 2 puffs into the lungs every 6 (six) hours as needed for wheezing or shortness of breath., Disp: , Rfl:  .  aspirin EC 81 MG tablet, Take 81 mg by mouth daily., Disp: , Rfl:  .  budesonide-formoterol (SYMBICORT) 160-4.5 MCG/ACT  inhaler, INHALE 2 PUFFS FIRST THING IN THE MORNING AND ANOTHER 2 PUFFS ABOUT 12 HOURS LATER, Disp: 10.2 g, Rfl: 0 .  diphenhydrAMINE (ALLERGY) 25 MG tablet, Take 25 mg by mouth 2 (two) times daily., Disp: , Rfl:  .  furosemide (LASIX) 40 MG tablet, TAKE 1 TABLET BY MOUTH 3 TIMES A WEEK, Disp: 12 tablet, Rfl: 3 .  Glucosamine-Chondroit-Vit C-Mn (GLUCOSAMINE 1500 COMPLEX PO), Take 1 tablet by mouth 2 (two) times daily., Disp: , Rfl:  .  Multiple Vitamins-Minerals (MENS 50+ ADVANCED PO), Take 1 tablet by mouth daily., Disp: , Rfl:  .  nitroGLYCERIN (NITROSTAT) 0.4 MG SL tablet, PLACE 1 TABLET UNDER THE TONGUE EVERY 5 MINUTES AS NEEDED FOR CHEST PAIN UP TO 3 DOSES, IF SYMPTOMS PERSIST CALL 911, Disp: 25 tablet, Rfl: 3 .  pantoprazole (PROTONIX) 40 MG tablet, Take 1 tablet (40 mg total) by mouth 2 (two) times daily., Disp: 60 tablet, Rfl: 11 .  potassium chloride (K-DUR) 10 MEQ tablet, TAKE 1 TABLET BY MOUTH 3 TIMES PER WEEK,MONDAY, WEDNESDAY, AND FRIDAY, Disp: 12 tablet, Rfl: 3 .  rosuvastatin (CRESTOR) 40 MG tablet, Take 1 tablet (40 mg total) by mouth daily., Disp: 30 tablet, Rfl: 9 .  vitamin C (ASCORBIC ACID) 500 MG tablet, Take 1,000 mg by mouth daily., Disp: , Rfl:  .  meclizine (ANTIVERT) 25 MG tablet, Take 25 mg by mouth every morning., Disp: , Rfl:   PAST MEDICAL HISTORY: Past Medical History:  Diagnosis Date  . AAA (abdominal aortic aneurysm) ([place])   . Arthritis   . Back pain   . CAD (coronary artery disease)    Status post CABG  . COPD (chronic obstructive pulmonary disease) ([place])   . Dizziness    takes Antivert daily  . Erectile dysfunction   . GERD (gastroesophageal reflux disease)    takes Omeprazole daily  . History of blood clots    to the lungs   . HTN (hypertension)    takes Amlodipine and Micardis daily  . Hx of cardiovascular stress test    Lexiscan Myoview (2/16):  Possible apical infarct, no ischemia, EF 51%; Low Risk  . Hyperlipidemia    takes Crestor daily  .  Myocardial infarction ([place])   . Pulmonary embolism ([place])     PAST SURGICAL HISTORY: Past Surgical History:  Procedure Laterality Date  . Broken Ankle    . Broken Jaw     20+ years ago  . CARDIOVASCULAR STRESS TEST  04-28-2005   EF 53%  . CORONARY ARTERY BYPASS GRAFT  1998   there are sequential 90% stenosis in the proximal LAD, and the mid and distal LAD is a moderate sized vessel. The mid and first diagonal branch has a 50-60% stenosis.   Marland Kitchen ESOPHAGOSCOPY  06/25/2011   Procedure: ESOPHAGOSCOPY;  Surgeon: Izora Gala, MD;  Location: [place];  Service: ENT;  Laterality: N/A;  Direct Esophagoscopy  . TONSILLECTOMY    . TOTAL SHOULDER  ARTHROPLASTY Right 06/02/2013   Procedure: RIGHT TOTAL SHOULDER ARTHROPLASTY;  Surgeon: Marin Shutter, MD;  Location: [place];  Service: Orthopedics;  Laterality: Right;    FAMILY HISTORY: Family History  Problem Relation Age of Onset  . Heart attack Mother   . Stroke Mother   . Breast cancer Mother     SOCIAL HISTORY:  Social History   Socioeconomic History  . Marital status: Married    Spouse name: Not on file  . Number of children: Not on file  . Years of education: Not on file  . Highest education level: Not on file  Social Needs  . Financial resource strain: Not on file  . Food insecurity - worry: Not on file  . Food insecurity - inability: Not on file  . Transportation needs - medical: Not on file  . Transportation needs - non-medical: Not on file  Occupational History  . Occupation: Retired- salesrep  Tobacco Use  . Smoking status: Former Smoker    Packs/day: 1.00    Years: 20.00    Pack years: 20.00    Types: Cigarettes    Last attempt to quit: 02/25/1976    Years since quitting: 41.0  . Smokeless tobacco: Never Used  Substance and Sexual Activity  . Alcohol use: Yes    Alcohol/week: 0.6 oz    Types: 1 Glasses of wine per week    Comment: occ  . Drug use: No  . Sexual activity: Not on file  Other Topics Concern  . Not on file    Social History Narrative  . Not on file     PHYSICAL EXAM  Vitals:   02/25/17 1051  BP: (!) 145/80  Pulse: 91  Resp: 20  Weight: 241 lb (109.3 kg)  Height: _0  (1.854 m)    Body mass index is 31.8 kg/m.   General: The patient is well-developed and well-nourished and in no acute distress  Eyes:  Funduscopic exam shows normal optic discs and retinal vessels.  Neck: The neck is supple, no carotid bruits are noted.  The neck is nontender with mild reduced range of motion.  Cardiovascular: The heart has a regular rate and rhythm with a normal S1 and S2. There were no murmurs, gallops or rubs. Lungs are clear to auscultation.  Skin: Extremities are without significant edema.  Musculoskeletal:  Back is nontender  Neurologic Exam  Mental status: The patient is alert and oriented x 3 at the time of the examination. The patient has apparent normal recent and remote memory, with an apparently normal attention span and concentration ability.   Speech is normal.  Cranial nerves: Extraocular movements are full. Pupils are equal, round, and reactive to light and accomodation.  Visual fields are full.  Facial symmetry is present. There is good facial sensation to soft touch bilaterally.Facial strength is normal.  Trapezius and sternocleidomastoid strength is normal. No dysarthria is noted.  The tongue is midline, and the patient has symmetric elevation of the soft palate. No obvious hearing deficits are noted.  Motor:  Muscle bulk is normal.   Tone is normal. Strength is  5 / 5 in the left arm and 4+/5 in the right triceps and intrinsic hand muscles..   Sensory: He has intact sensation to touch, temperature and vibration in the arms. He has moderately reduced vibration sensation in the toes and mild reduced vibration sensation at the ankles. He also has reduced sensation to temperature and touch in both feet to above the ankles.Marland Kitchen  Coordination: Cerebellar testing reveals good  finger-nose-finger and reduced heel-to-shin bilaterally.  Gait and station: He has trouble getting out of a chair without using his arms. The station is normal. The gait is wide with a short stride. He takes 5 steps to turn 180. He is unable to do tandem walk. Romberg sign is negative..   Reflexes: Deep tendon reflexes are symmetric and normal in the arms and knees and absent at the ankles.  Plantar responses are equivocal on the right and downgoing on the left.    DIAGNOSTIC DATA (LABS, IMAGING, TESTING) - I reviewed patient records, labs, notes, testing and imaging myself where available.  Lab Results  Component Value Date   WBC 7.0 03/28/2014   HGB 12.6 (L) 03/28/2014   HCT 37.6 (L) 03/28/2014   MCV 90.5 03/28/2014   PLT 285.0 03/28/2014      Component Value Date/Time   NA 142 06/11/2016 1100   K 5.0 06/11/2016 1100   CL 102 06/11/2016 1100   CO2 23 06/11/2016 1100   GLUCOSE 100 (H) 06/11/2016 1100   GLUCOSE 105 (H) 11-Feb-202017 1553   BUN 29 (H) 06/11/2016 1100   CREATININE 1.48 (H) 06/11/2016 1100   CREATININE 1.54 (H) 11-Feb-202017 1553   CALCIUM 9.0 06/11/2016 1100   PROT 6.6 06/11/2016 1100   ALBUMIN 4.0 06/11/2016 1100   AST 22 06/11/2016 1100   ALT 16 06/11/2016 1100   ALKPHOS 82 06/11/2016 1100   BILITOT 0.5 06/11/2016 1100   GFRNONAA 43 (L) 06/11/2016 1100   GFRAA 50 (L) 06/11/2016 1100   Lab Results  Component Value Date   CHOL 159 06/11/2016   HDL 37 (L) 06/11/2016   LDLCALC 87 06/11/2016   TRIG 177 (H) 06/11/2016   CHOLHDL 4.3 06/11/2016   No results found for: HGBA1C No results found for: VITAMINB12 Lab Results  Component Value Date   TSH 3.86 03/28/2014       ASSESSMENT AND PLAN  Polyneuropathy - Plan: Comprehensive metabolic panel, Hemoglobin A1c, Multiple Myeloma Panel (SPEP&IFE w/QIG), NCV with EMG(electromyography), Vitamin B12  Gait disturbance - Plan: Comprehensive metabolic panel, Hemoglobin A1c, Multiple Myeloma Panel (SPEP&IFE  w/QIG), NCV with EMG(electromyography), Vitamin B12  Neck pain  Fall, initial encounter  Cerebellar ataxia in diseases classified elsewhere [facility]) - Plan: MR BRAIN WO CONTRAST, MR CERVICAL SPINE WO CONTRAST   In summary, Brent [name] is an 84 year old man who has a progressive gait disorder. Due to the combination of neck pain, right arm weakness and progressive gait dysfunction, I am most concerned about the possibility of cervical spine stenosis with myelopathy and we need to obtain an MRI of the cervical spine to look for this possibility and refer to neurosurgery if severe stenosis is noted. Additionally, we need to check an MRI of the brain to determine if he has normal pressure hydrocephalus, chronic subdural hematomas, severe ischemic changes or other CNS process that could explain his gait disorder progressing over the past few months. Further evaluation and treatment may be necessary based on the results. Additionally, he has numbness in his feet and appears to have a mild polyneuropathy that could be playing a role on the the severity of his gait dysfunction seems worse than what would occur with a moderate polyneuropathy. We will check an NCV/EMG to further evaluate and also check SPEP, hemoglobin A1c and B12.    He is advised to use a cane and to hold on for balance while going up and down stairs and when in the  shower or use a shower chair.  I will see him back when he returns for the EMG but he or his family should call sooner if worse or present to the emergency room if there is a sudden deterioration. We will also call with the results of the MRIs.  Thank you for asking me to see Brent [name]. Please let me know if I can be of further assistance with her or other patients in the future.   Richard A. Felecia Shelling, MD, [facility] 08/27/6679, 59:47 AM Certified in Neurology, Clinical Neurophysiology, Sleep Medicine, Pain Medicine and Neuroimaging  [facility] [address] [place], [place] [address] [phone]

## 2017-02-26 DIAGNOSIS — K429 Umbilical hernia without obstruction or gangrene: Secondary | ICD-10-CM | POA: Diagnosis not present

## 2017-02-26 DIAGNOSIS — Z885 Allergy status to narcotic agent status: Secondary | ICD-10-CM | POA: Diagnosis not present

## 2017-02-26 DIAGNOSIS — E785 Hyperlipidemia, unspecified: Secondary | ICD-10-CM | POA: Diagnosis not present

## 2017-02-26 DIAGNOSIS — I509 Heart failure, unspecified: Secondary | ICD-10-CM | POA: Diagnosis not present

## 2017-02-26 DIAGNOSIS — J189 Pneumonia, unspecified organism: Secondary | ICD-10-CM | POA: Diagnosis not present

## 2017-02-26 DIAGNOSIS — Z8249 Family history of ischemic heart disease and other diseases of the circulatory system: Secondary | ICD-10-CM | POA: Diagnosis not present

## 2017-02-26 DIAGNOSIS — J440 Chronic obstructive pulmonary disease with acute lower respiratory infection: Secondary | ICD-10-CM | POA: Diagnosis not present

## 2017-02-26 DIAGNOSIS — J441 Chronic obstructive pulmonary disease with (acute) exacerbation: Secondary | ICD-10-CM | POA: Diagnosis not present

## 2017-02-26 DIAGNOSIS — J9601 Acute respiratory failure with hypoxia: Secondary | ICD-10-CM | POA: Diagnosis not present

## 2017-02-26 DIAGNOSIS — Z87891 Personal history of nicotine dependence: Secondary | ICD-10-CM | POA: Diagnosis not present

## 2017-02-27 DIAGNOSIS — J441 Chronic obstructive pulmonary disease with (acute) exacerbation: Secondary | ICD-10-CM | POA: Diagnosis not present

## 2017-02-27 DIAGNOSIS — J189 Pneumonia, unspecified organism: Secondary | ICD-10-CM | POA: Diagnosis not present

## 2017-02-27 DIAGNOSIS — J9601 Acute respiratory failure with hypoxia: Secondary | ICD-10-CM | POA: Diagnosis not present

## 2017-02-28 DIAGNOSIS — J189 Pneumonia, unspecified organism: Secondary | ICD-10-CM | POA: Diagnosis not present

## 2017-02-28 DIAGNOSIS — J441 Chronic obstructive pulmonary disease with (acute) exacerbation: Secondary | ICD-10-CM | POA: Diagnosis not present

## 2017-02-28 DIAGNOSIS — J9601 Acute respiratory failure with hypoxia: Secondary | ICD-10-CM | POA: Diagnosis not present

## 2017-02-28 LAB — MULTIPLE MYELOMA PANEL, SERUM
ALBUMIN/GLOB SERPL: 0.8 (ref 0.7–1.7)
ALPHA 1: 0.5 g/dL — AB (ref 0.0–0.4)
ALPHA2 GLOB SERPL ELPH-MCNC: 1.3 g/dL — AB (ref 0.4–1.0)
Albumin SerPl Elph-Mcnc: 3 g/dL (ref 2.9–4.4)
B-Globulin SerPl Elph-Mcnc: 1.2 g/dL (ref 0.7–1.3)
Gamma Glob SerPl Elph-Mcnc: 0.9 g/dL (ref 0.4–1.8)
Globulin, Total: 3.8 g/dL (ref 2.2–3.9)
IGA/IMMUNOGLOBULIN A, SERUM: 320 mg/dL (ref 61–437)
IGM (IMMUNOGLOBULIN M), SRM: 52 mg/dL (ref 15–143)
IgG (Immunoglobin G), Serum: 906 mg/dL (ref 700–1600)

## 2017-02-28 LAB — COMPREHENSIVE METABOLIC PANEL
A/G RATIO: 1.4 (ref 1.2–2.2)
ALK PHOS: 89 IU/L (ref 39–117)
ALT: 10 IU/L (ref 0–44)
AST: 19 IU/L (ref 0–40)
Albumin: 4 g/dL (ref 3.5–4.7)
BILIRUBIN TOTAL: 0.8 mg/dL (ref 0.0–1.2)
BUN / CREAT RATIO: 14 (ref 10–24)
BUN: 22 mg/dL (ref 8–27)
CHLORIDE: 99 mmol/L (ref 96–106)
CO2: 20 mmol/L (ref 20–29)
CREATININE: 1.61 mg/dL — AB (ref 0.76–1.27)
Calcium: 8.6 mg/dL (ref 8.6–10.2)
GFR calc Af Amer: 45 mL/min/{1.73_m2} — ABNORMAL LOW (ref >59)
GFR calc non Af Amer: 39 mL/min/{1.73_m2} — ABNORMAL LOW (ref >59)
GLOBULIN, TOTAL: 2.8 g/dL (ref 1.5–4.5)
Glucose: 108 mg/dL — ABNORMAL HIGH (ref 65–99)
POTASSIUM: 4 mmol/L (ref 3.5–5.2)
Sodium: 138 mmol/L (ref 134–144)
Total Protein: 6.8 g/dL (ref 6.0–8.5)

## 2017-02-28 LAB — VITAMIN B12: Vitamin B-12: 509 pg/mL (ref 232–1245)

## 2017-02-28 LAB — HEMOGLOBIN A1C
ESTIMATED AVERAGE GLUCOSE: 134 mg/dL
HEMOGLOBIN A1C: 6.3 % — AB (ref 4.8–5.6)

## 2017-03-01 DIAGNOSIS — J9601 Acute respiratory failure with hypoxia: Secondary | ICD-10-CM | POA: Diagnosis not present

## 2017-03-01 DIAGNOSIS — J441 Chronic obstructive pulmonary disease with (acute) exacerbation: Secondary | ICD-10-CM | POA: Diagnosis not present

## 2017-03-01 DIAGNOSIS — J189 Pneumonia, unspecified organism: Secondary | ICD-10-CM | POA: Diagnosis not present

## 2017-03-03 ENCOUNTER — Telehealth: Payer: Self-pay | Admitting: *Deleted

## 2017-03-03 NOTE — Telephone Encounter (Signed)
-----   Message from Britt Bottom, MD sent at 02/26/2017  8:28 AM EST ----- Please let him know that the labs show that he could have a borderline diabetes (hemoglobin A1c mildly elevated and the glucose was minimally elevated) this could be contributing a little bit to the numbness in his feet.

## 2017-03-03 NOTE — Telephone Encounter (Signed)
Spoke with pt. this am and reviewed below lab results.  He verbalized understanding of same; will f/u with pcp regarding A1C level/fim

## 2017-03-11 ENCOUNTER — Ambulatory Visit: Payer: PPO | Admitting: Cardiovascular Disease

## 2017-03-11 ENCOUNTER — Encounter: Payer: Self-pay | Admitting: Cardiovascular Disease

## 2017-03-11 VITALS — BP 118/82 | HR 47 | Ht 73.0 in | Wt 237.1 lb

## 2017-03-11 DIAGNOSIS — I5032 Chronic diastolic (congestive) heart failure: Secondary | ICD-10-CM | POA: Diagnosis not present

## 2017-03-11 DIAGNOSIS — L603 Nail dystrophy: Secondary | ICD-10-CM | POA: Diagnosis not present

## 2017-03-11 DIAGNOSIS — I2510 Atherosclerotic heart disease of native coronary artery without angina pectoris: Secondary | ICD-10-CM | POA: Diagnosis not present

## 2017-03-11 DIAGNOSIS — D225 Melanocytic nevi of trunk: Secondary | ICD-10-CM | POA: Diagnosis not present

## 2017-03-11 DIAGNOSIS — L820 Inflamed seborrheic keratosis: Secondary | ICD-10-CM | POA: Diagnosis not present

## 2017-03-11 DIAGNOSIS — L570 Actinic keratosis: Secondary | ICD-10-CM | POA: Diagnosis not present

## 2017-03-11 DIAGNOSIS — Z85820 Personal history of malignant melanoma of skin: Secondary | ICD-10-CM | POA: Diagnosis not present

## 2017-03-11 NOTE — Progress Notes (Signed)
Cardiology Office Note   Date:  03/11/2017   ID:  Brent [name], DOB [birth year], MRN [id number]  PCP:  Maury Dus, MD  Cardiologist:   Mertie Moores, MD   Chief Complaint  Patient presents with  . Coronary Artery Disease   1. CAD / CABG - 2000 2. HTN 3. Hyperlipidemia  History of Present Illness:  Brent [name] is 84 y.o. gentleman with a hx of CAD/CABG, hyperlipidemia, HTN. He has not had any chest pain. He has been having lots of left hip pain. He had a steroid injection which helped quite a bit but it appears that the injection has now worn off.  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  Feb. 24, 2014:  He has been having some dyspnea with exercise. He has lots of orthopedic problems. He is exercising some some but not as much as he would like.  Nov. 6 ,2014:  Brent [name] is doing well. He recently had a laser treatment for an esophageal issue. No cardiac issues. Feeling well. Still working on a [name] . He has lost 10 lbs.   September 21, 2013:  Brent [name] is doing well. Has had a right shoulder replacement.  Feb. 17, 2016:  Brent [name] is a 84 y.o. male who presents for follow up of his dyspnea. He has been getting more short of breath over the past several weeks.  Was having DOE climbing up an incline.  He was seen by Nicki Reaper in clinic. He was started on Lasix and feels much better. He has decreased the lasix to twice a week. .  Follow-up Myoview was low risk .Overall Impression: Low risk stress nuclear study With no ischemia identified. Possible apical infarct pattern. Decreased sensitivity secondary to bowel loop attenuation..  LV Ejection Fraction: 51%. LV Wall Motion: Septal wall hypokinesis with otherwise normal LV function  Not having any chest pain   Jul 14, 2014:  Brent [name] is a 84 y.o. male who presents for follow up of his CAD  Having some bronchitis.  No CP . Still working on his antique car. Not exercising much   Jul 16, 2015  Doing well. No cardiac problems  Chronic back problems. Has COPD   Oct. 5, 2017:  Has been having low energy.   BP was low.  Amlodipine has been held  BP is normal today   June 11, 2016:  BP has been good.   Jan. 16  2019      Doing well from a heart [place] Has become weaker  Has fallen ,  Had pneumonia last week .   Has been losing weight    Past Medical History:  Diagnosis Date  . AAA (abdominal aortic aneurysm) ([place])   . Arthritis   . Back pain   . CAD (coronary artery disease)    Status post CABG  . COPD (chronic obstructive pulmonary disease) ([place])   . Dizziness    takes Antivert daily  . Erectile dysfunction   . GERD (gastroesophageal reflux disease)    takes Omeprazole daily  . History of blood clots    to the lungs   . HTN (hypertension)    takes Amlodipine and Micardis daily  . Hx of cardiovascular stress test    Lexiscan Myoview (2/16):  Possible apical infarct, no ischemia, EF 51%; Low Risk  . Hyperlipidemia    takes Crestor daily  . Myocardial infarction ([place])   . Pulmonary embolism ([facility])     Past  Surgical History:  Procedure Laterality Date  . Broken Ankle    . Broken Jaw     20+ years ago  . CARDIOVASCULAR STRESS TEST  04-28-2005   EF 53%  . CORONARY ARTERY BYPASS GRAFT  1998   there are sequential 90% stenosis in the proximal LAD, and the mid and distal LAD is a moderate sized vessel. The mid and first diagonal branch has a 50-60% stenosis.   Marland Kitchen ESOPHAGOSCOPY  06/25/2011   Procedure: ESOPHAGOSCOPY;  Surgeon: Izora Gala, MD;  Location: [place];  Service: ENT;  Laterality: N/A;  Direct Esophagoscopy  . TONSILLECTOMY    . TOTAL SHOULDER ARTHROPLASTY Right 06/02/2013   Procedure: RIGHT TOTAL SHOULDER ARTHROPLASTY;  Surgeon: Marin Shutter, MD;  Location: [place];  Service: Orthopedics;  Laterality: Right;     Current Outpatient Medications  Medication Sig Dispense Refill  . albuterol (PROAIR HFA) 108 (90 BASE) MCG/ACT inhaler Inhale  2 puffs into the lungs every 6 (six) hours as needed for wheezing or shortness of breath.    Marland Kitchen aspirin EC 81 MG tablet Take 81 mg by mouth daily.    . budesonide-formoterol (SYMBICORT) 160-4.5 MCG/ACT inhaler INHALE 2 PUFFS FIRST THING IN THE MORNING AND ANOTHER 2 PUFFS ABOUT 12 HOURS LATER 10.2 g 0  . diphenhydrAMINE (ALLERGY) 25 MG tablet Take 25 mg by mouth 2 (two) times daily.    . DULoxetine (CYMBALTA) 60 MG capsule Take 60 mg by mouth daily.    . furosemide (LASIX) 40 MG tablet TAKE 1 TABLET BY MOUTH 3 TIMES A WEEK 12 tablet 3  . Glucosamine-Chondroit-Vit C-Mn (GLUCOSAMINE 1500 COMPLEX PO) Take 1 tablet by mouth 2 (two) times daily.    . GuaiFENesin (MUCINEX CHEST CONGESTION CHILD PO) Take by mouth.    . meclizine (ANTIVERT) 25 MG tablet Take 25 mg by mouth every morning.    . Multiple Vitamins-Minerals (MENS 50+ ADVANCED PO) Take 1 tablet by mouth daily.    . nitroGLYCERIN (NITROSTAT) 0.4 MG SL tablet PLACE 1 TABLET UNDER THE TONGUE EVERY 5 MINUTES AS NEEDED FOR CHEST PAIN UP TO 3 DOSES, IF SYMPTOMS PERSIST CALL 911 25 tablet 3  . pantoprazole (PROTONIX) 40 MG tablet Take 1 tablet (40 mg total) by mouth 2 (two) times daily. 60 tablet 11  . potassium chloride (K-DUR) 10 MEQ tablet TAKE 1 TABLET BY MOUTH 3 TIMES PER WEEK,MONDAY, WEDNESDAY, AND FRIDAY 12 tablet 3  . predniSONE (DELTASONE) 10 MG tablet AS DIRECTED  0  . pseudoephedrine-guaifenesin (MUCINEX D) 60-600 MG 12 hr tablet Take 1 tablet by mouth every 12 (twelve) hours.    . rosuvastatin (CRESTOR) 40 MG tablet Take 1 tablet (40 mg total) by mouth daily. 30 tablet 9  . vitamin C (ASCORBIC ACID) 500 MG tablet Take 1,000 mg by mouth daily.     No current facility-administered medications for this visit.     Allergies:   Morphine and Morphine and related    Social History:  The patient  reports that he quit smoking about 41 years ago. His smoking use included cigarettes. He has a 20.00 pack-year smoking history. he has never used  smokeless tobacco. He reports that he drinks about 0.6 oz of alcohol per week. He reports that he does not use drugs.   Family History:  The patient's family history includes Breast cancer in his mother; Heart attack in his mother; Stroke in his mother.    ROS: Noted in current history, all other systems are negative  Physical Exam: Blood pressure 118/82, pulse (!) 47, height 6\' 1"  (1.854 m), weight 237 lb 1.9 oz (107.6 kg), SpO2 97 %.  GEN:   Elderly male,  Appears weaker than previous visit  HEENT: Normal NECK: No JVD; No carotid bruits LYMPHATICS: No lymphadenopathy CARDIAC:  RR , normal S1S2  RESPIRATORY:  Rales in bases  ABDOMEN: Soft, non-tender, non-distended MUSCULOSKELETAL:  No edema; No deformity  SKIN: Warm and dry NEUROLOGIC:  Alert and oriented x 3   EKG:      Recent Labs: 02/25/2017: ALT 10; BUN 22; Creatinine, Ser 1.61; Potassium 4.0; Sodium 138    Lipid Panel    Component Value Date/Time   CHOL 159 06/11/2016 1100   TRIG 177 (H) 06/11/2016 1100   HDL 37 (L) 06/11/2016 1100   CHOLHDL 4.3 06/11/2016 1100   CHOLHDL 3.9 12/20/2015 1553   VLDL 37 (H) 12/20/2015 1553   LDLCALC 87 06/11/2016 1100      Wt Readings from Last 3 Encounters:  03/11/17 237 lb 1.9 oz (107.6 kg)  02/25/17 241 lb (109.3 kg)  01/20/17 247 lb (112 kg)      Other studies Reviewed: Additional studies/ records that were reviewed today include: . Review of the above records demonstrates:    ASSESSMENT AND PLAN:  1. CAD / CABG - 2000 -not having any angina.  2. HTN-  hs   3. Hyperlipidemia -    4.   HX of chronic diastolic CHF:   He has some rales in his bases.  He may have some transient congestive heart failure secondary to his pneumonia.  I have given him the okay to take Lasix and potassium chloride on a daily basis for the next week or so to see if he feels better.       Current medicines are reviewed at length with the patient today.  The patient does not have concerns  regarding medicines.  The following changes have been made:  no change  Labs/ tests ordered today include:  No orders of the defined types were placed in this encounter.    Disposition:   FU with me in 6 months     Mertie Moores, MD  03/11/2017 3:06 PM    [facility] [place], [place], [place]  [address] Phone: [phone]; Fax: [phone]

## 2017-03-11 NOTE — Patient Instructions (Signed)
Medication Instructions:  Your physician recommends that you continue on your current medications as directed. Please refer to the Current Medication list given to you today.   Labwork: None Ordered   Testing/Procedures: None Ordered   Follow-Up: Your physician wants you to follow-up in: 6 months with Dr. Nahser.  You will receive a reminder letter in the mail two months in advance. If you don't receive a letter, please call our office to schedule the follow-up appointment.   If you need a refill on your cardiac medications before your next appointment, please call your pharmacy.   Thank you for choosing [facility]! Anders Hohmann, RN [phone]    

## 2017-03-13 DIAGNOSIS — N478 Other disorders of prepuce: Secondary | ICD-10-CM | POA: Diagnosis not present

## 2017-03-13 DIAGNOSIS — N433 Hydrocele, unspecified: Secondary | ICD-10-CM | POA: Diagnosis not present

## 2017-03-13 DIAGNOSIS — N529 Male erectile dysfunction, unspecified: Secondary | ICD-10-CM | POA: Diagnosis not present

## 2017-03-17 ENCOUNTER — Encounter: Payer: PPO | Admitting: Neurology

## 2017-03-17 ENCOUNTER — Ambulatory Visit
Admission: RE | Admit: 2017-03-17 | Discharge: 2017-03-17 | Disposition: A | Discharge status: Home / Self Care | Payer: PPO | Source: Ambulatory Visit | Attending: Neurology | Admitting: Neurology

## 2017-03-17 ENCOUNTER — Ambulatory Visit (INDEPENDENT_AMBULATORY_CARE_PROVIDER_SITE_OTHER): Payer: PPO | Admitting: Neurology

## 2017-03-17 ENCOUNTER — Encounter: Payer: Self-pay | Admitting: Neurology

## 2017-03-17 DIAGNOSIS — G3281 Cerebellar ataxia in diseases classified elsewhere: Secondary | ICD-10-CM

## 2017-03-17 DIAGNOSIS — R269 Unspecified abnormalities of gait and mobility: Secondary | ICD-10-CM

## 2017-03-17 DIAGNOSIS — G629 Polyneuropathy, unspecified: Secondary | ICD-10-CM

## 2017-03-17 DIAGNOSIS — R200 Anesthesia of skin: Secondary | ICD-10-CM | POA: Diagnosis not present

## 2017-03-17 DIAGNOSIS — Z0289 Encounter for other administrative examinations: Secondary | ICD-10-CM

## 2017-03-17 NOTE — Progress Notes (Signed)
Full Name: Brent [name] Gender: Male MRN #: [id number] Date of Birth: Mar 19, 1931    Visit Date: 03/17/2017 13:46 Age: 84 Years 47 Months Old Examining Physician: Arlice Colt, MD     History: Brent [name] is an 84 year old man with bilateral foot numbness and gait disturbance.  Nerve conduction studies: The right median, ulnar, peroneal and tibial motor responses were normal. The right median, ulnar and superficial peroneal sensory responses were normal. Median and ulnar F-wave responses were minimally slowed.  Needle EMG: Needle EMG of selected muscles of the right leg was performed. There was no abnormal spontaneous activity in any of the muscles tested. There was mild chronic denervation noted with in 2 foot muscles tested and some  polyphasic motor units were noted in the gastrocnemius muscle, the recruitment was normal..  Impression: This is a mildly abnormal NCV/EMG study. There is no evidence of a significant polyneuropathy, radiculopathy or myopathy. A minimal chronic S1 radiculopathy cannot be ruled out.  Meeyah Ovitt A. Felecia Shelling, MD, PhD, FAAN Certified in Neurology, Clinical Neurophysiology, Sleep Medicine, Pain Medicine and Neuroimaging Director, [place] [facility] [address], [place], [place] [address] [phone]         [facility]    Nerve / Sites Muscle Latency Ref. Amplitude Ref. Rel Amp Segments Distance Velocity Ref. Area    ms ms mV mV %  cm m/s m/s mVms  R Median - APB     Wrist APB 4.7 ?4.4 7.6 ?4.0 100 Wrist - APB 7   26.1     Upper arm APB 9.1  7.0  92.7 Upper arm - Wrist 26 59 ?49 25.3  R Ulnar - ADM     Wrist ADM 5.3 ?3.3 9.7 ?6.0 100 Wrist - ADM 7   26.1     B.Elbow ADM 9.9  7.0  71.6 B.Elbow - Wrist 23 50 ?49 17.1     A.Elbow ADM 11.7  7.4  107 A.Elbow - B.Elbow 10 58 ?49 23.8         A.Elbow - Wrist      R Peroneal - EDB     Ankle EDB 7.4 ?6.5 1.6 ?2.0 100 Ankle - EDB 9    6.1     Fib head EDB 17.1  2.0  129 Fib head - Ankle 48 50 ?44 9.1     Pop fossa EDB 18.2  2.0  99.8 Pop fossa - Fib head 58 506 ?44 9.5         Pop fossa - Ankle      R Tibial - AH     Ankle AH 9.1 ?5.8 3.7 ?4.0 100 Ankle - AH 9   15.5     Pop fossa AH 22.3  2.3  62.1 Pop fossa - Ankle 53 40 ?41 20.0             SNC    Nerve / Sites Rec. Site Peak Lat Ref.  Amp Ref. Segments Distance    ms ms V V  cm  R Superficial peroneal - Ankle     Lat leg Ankle 4.0 ?4.4 12 ?6 Lat leg - Ankle 14       SNC    Nerve / Sites Rec. Site Peak Lat Amp Segments Distance    ms V  cm  R Median - Orthodromic (Dig II, Mid palm)     Dig II Wrist 4.4 9 Dig II -  Wrist 13  R Ulnar - Orthodromic, (Dig V, Mid palm)     Dig V Wrist 3.4 14 Dig V - Wrist 64         F  Wave    Nerve F Lat Ref.   ms ms  R Median - APB 34.8 ?31.0  R Ulnar - ADM 36.0 ?32.0         EMG full       EMG Summary Table    Spontaneous MUAP Recruitment  Muscle IA Fib PSW Fasc Other Amp Dur. Poly Pattern  R. Vastus medialis Normal None None None _______ Normal Normal Normal Normal  R. Tibialis anterior Normal None None None _______ Normal Normal 1+ Normal  R. Peroneus longus Normal None None None _______ Normal Normal Normal Normal  R. Gastrocnemius (Medial head) Normal None None None _______ Normal Normal 1+ Normal  R. Dorsal interossei (pedis) Normal None None None _______ Normal Normal 1+ Reduced  R. Abductor hallucis Normal None None None _______ Normal Normal 1+ Reduced

## 2017-03-27 DIAGNOSIS — J189 Pneumonia, unspecified organism: Secondary | ICD-10-CM

## 2017-03-27 HISTORY — DX: Pneumonia, unspecified organism: J18.9

## 2017-03-30 ENCOUNTER — Other Ambulatory Visit: Payer: Self-pay | Admitting: *Deleted

## 2017-03-30 DIAGNOSIS — Z7689 Persons encountering health services in other specified circumstances: Secondary | ICD-10-CM | POA: Diagnosis not present

## 2017-03-30 DIAGNOSIS — F329 Major depressive disorder, single episode, unspecified: Secondary | ICD-10-CM | POA: Diagnosis not present

## 2017-03-30 DIAGNOSIS — I2510 Atherosclerotic heart disease of native coronary artery without angina pectoris: Secondary | ICD-10-CM | POA: Diagnosis not present

## 2017-03-30 DIAGNOSIS — I701 Atherosclerosis of renal artery: Secondary | ICD-10-CM | POA: Diagnosis not present

## 2017-03-30 DIAGNOSIS — G319 Degenerative disease of nervous system, unspecified: Secondary | ICD-10-CM | POA: Diagnosis not present

## 2017-03-30 DIAGNOSIS — I10 Essential (primary) hypertension: Secondary | ICD-10-CM

## 2017-03-30 DIAGNOSIS — I714 Abdominal aortic aneurysm, without rupture: Secondary | ICD-10-CM | POA: Diagnosis not present

## 2017-03-30 DIAGNOSIS — K219 Gastro-esophageal reflux disease without esophagitis: Secondary | ICD-10-CM | POA: Diagnosis not present

## 2017-03-30 DIAGNOSIS — J449 Chronic obstructive pulmonary disease, unspecified: Secondary | ICD-10-CM | POA: Diagnosis not present

## 2017-03-30 DIAGNOSIS — I493 Ventricular premature depolarization: Secondary | ICD-10-CM | POA: Diagnosis not present

## 2017-03-30 DIAGNOSIS — Z87891 Personal history of nicotine dependence: Secondary | ICD-10-CM | POA: Diagnosis not present

## 2017-03-30 DIAGNOSIS — E785 Hyperlipidemia, unspecified: Secondary | ICD-10-CM | POA: Diagnosis not present

## 2017-03-30 DIAGNOSIS — Z01818 Encounter for other preprocedural examination: Secondary | ICD-10-CM | POA: Diagnosis not present

## 2017-03-30 DIAGNOSIS — R609 Edema, unspecified: Secondary | ICD-10-CM | POA: Diagnosis not present

## 2017-03-30 DIAGNOSIS — M542 Cervicalgia: Secondary | ICD-10-CM | POA: Diagnosis not present

## 2017-03-30 DIAGNOSIS — N433 Hydrocele, unspecified: Secondary | ICD-10-CM | POA: Diagnosis not present

## 2017-03-30 DIAGNOSIS — N471 Phimosis: Secondary | ICD-10-CM | POA: Diagnosis not present

## 2017-03-30 MED ORDER — FUROSEMIDE 40 MG PO TABS: ORAL_TABLET | ORAL | 3 refills | Status: DC

## 2017-03-30 MED ORDER — POTASSIUM CHLORIDE ER 10 MEQ PO TBCR: EXTENDED_RELEASE_TABLET | ORAL | 3 refills | Status: DC

## 2017-04-05 DIAGNOSIS — Z8249 Family history of ischemic heart disease and other diseases of the circulatory system: Secondary | ICD-10-CM | POA: Diagnosis not present

## 2017-04-05 DIAGNOSIS — I517 Cardiomegaly: Secondary | ICD-10-CM | POA: Diagnosis not present

## 2017-04-05 DIAGNOSIS — J9601 Acute respiratory failure with hypoxia: Secondary | ICD-10-CM | POA: Diagnosis not present

## 2017-04-05 DIAGNOSIS — J441 Chronic obstructive pulmonary disease with (acute) exacerbation: Secondary | ICD-10-CM | POA: Diagnosis not present

## 2017-04-05 DIAGNOSIS — N179 Acute kidney failure, unspecified: Secondary | ICD-10-CM | POA: Diagnosis not present

## 2017-04-05 DIAGNOSIS — I509 Heart failure, unspecified: Secondary | ICD-10-CM | POA: Diagnosis not present

## 2017-04-05 DIAGNOSIS — I248 Other forms of acute ischemic heart disease: Secondary | ICD-10-CM | POA: Diagnosis not present

## 2017-04-05 DIAGNOSIS — I088 Other rheumatic multiple valve diseases: Secondary | ICD-10-CM | POA: Diagnosis not present

## 2017-04-05 DIAGNOSIS — Z7982 Long term (current) use of aspirin: Secondary | ICD-10-CM | POA: Diagnosis not present

## 2017-04-05 DIAGNOSIS — E785 Hyperlipidemia, unspecified: Secondary | ICD-10-CM | POA: Diagnosis not present

## 2017-04-05 DIAGNOSIS — Z951 Presence of aortocoronary bypass graft: Secondary | ICD-10-CM | POA: Diagnosis not present

## 2017-04-05 DIAGNOSIS — I5031 Acute diastolic (congestive) heart failure: Secondary | ICD-10-CM | POA: Diagnosis not present

## 2017-04-05 DIAGNOSIS — Z452 Encounter for adjustment and management of vascular access device: Secondary | ICD-10-CM | POA: Diagnosis not present

## 2017-04-05 DIAGNOSIS — R7989 Other specified abnormal findings of blood chemistry: Secondary | ICD-10-CM | POA: Diagnosis not present

## 2017-04-05 DIAGNOSIS — R918 Other nonspecific abnormal finding of lung field: Secondary | ICD-10-CM | POA: Diagnosis not present

## 2017-04-05 DIAGNOSIS — Z87891 Personal history of nicotine dependence: Secondary | ICD-10-CM | POA: Diagnosis not present

## 2017-04-05 DIAGNOSIS — Z885 Allergy status to narcotic agent status: Secondary | ICD-10-CM | POA: Diagnosis not present

## 2017-04-05 DIAGNOSIS — I5033 Acute on chronic diastolic (congestive) heart failure: Secondary | ICD-10-CM | POA: Diagnosis not present

## 2017-04-05 DIAGNOSIS — I2510 Atherosclerotic heart disease of native coronary artery without angina pectoris: Secondary | ICD-10-CM | POA: Diagnosis not present

## 2017-04-05 DIAGNOSIS — J189 Pneumonia, unspecified organism: Secondary | ICD-10-CM | POA: Diagnosis not present

## 2017-04-05 DIAGNOSIS — I422 Other hypertrophic cardiomyopathy: Secondary | ICD-10-CM | POA: Diagnosis not present

## 2017-04-05 DIAGNOSIS — R0600 Dyspnea, unspecified: Secondary | ICD-10-CM | POA: Diagnosis not present

## 2017-04-05 DIAGNOSIS — Z8701 Personal history of pneumonia (recurrent): Secondary | ICD-10-CM | POA: Diagnosis not present

## 2017-04-05 DIAGNOSIS — R0602 Shortness of breath: Secondary | ICD-10-CM | POA: Diagnosis not present

## 2017-04-05 DIAGNOSIS — R05 Cough: Secondary | ICD-10-CM | POA: Diagnosis not present

## 2017-04-07 ENCOUNTER — Ambulatory Visit: Payer: PPO | Admitting: Internal Medicine

## 2017-04-13 ENCOUNTER — Ambulatory Visit: Payer: PPO | Admitting: Internal Medicine

## 2017-04-13 ENCOUNTER — Ambulatory Visit (INDEPENDENT_AMBULATORY_CARE_PROVIDER_SITE_OTHER): Payer: PPO | Admitting: Internal Medicine

## 2017-04-13 ENCOUNTER — Other Ambulatory Visit (INDEPENDENT_AMBULATORY_CARE_PROVIDER_SITE_OTHER): Payer: PPO

## 2017-04-13 ENCOUNTER — Ambulatory Visit (INDEPENDENT_AMBULATORY_CARE_PROVIDER_SITE_OTHER)
Admission: RE | Admit: 2017-04-13 | Discharge: 2017-04-13 | Disposition: A | Discharge status: Home / Self Care | Payer: PPO | Source: Ambulatory Visit | Attending: Internal Medicine | Admitting: Internal Medicine

## 2017-04-13 ENCOUNTER — Encounter: Payer: Self-pay | Admitting: Internal Medicine

## 2017-04-13 VITALS — BP 140/68 | HR 30 | Ht 73.0 in | Wt 234.0 lb

## 2017-04-13 DIAGNOSIS — J8410 Pulmonary fibrosis, unspecified: Secondary | ICD-10-CM | POA: Diagnosis not present

## 2017-04-13 DIAGNOSIS — I10 Essential (primary) hypertension: Secondary | ICD-10-CM

## 2017-04-13 DIAGNOSIS — J449 Chronic obstructive pulmonary disease, unspecified: Secondary | ICD-10-CM

## 2017-04-13 DIAGNOSIS — R0609 Other forms of dyspnea: Secondary | ICD-10-CM

## 2017-04-13 DIAGNOSIS — R05 Cough: Secondary | ICD-10-CM | POA: Diagnosis not present

## 2017-04-13 LAB — PULMONARY FUNCTION TEST
DL/VA % pred: 81 %
DL/VA: 3.86 ml/min/mmHg/L
DLCO UNC % PRED: 39 %
DLCO unc: 14.4 ml/min/mmHg
FEF 25-75 PRE: 1.49 L/s
FEF 25-75 Post: 2.24 L/sec
FEF2575-%Change-Post: 50 %
FEF2575-%PRED-PRE: 71 %
FEF2575-%Pred-Post: 107 %
FEV1-%CHANGE-POST: 7 %
FEV1-%PRED-POST: 60 %
FEV1-%Pred-Pre: 56 %
FEV1-PRE: 1.76 L
FEV1-Post: 1.89 L
FEV1FVC-%CHANGE-POST: 4 %
FEV1FVC-%PRED-PRE: 111 %
FEV6-%Change-Post: 4 %
FEV6-%PRED-POST: 55 %
FEV6-%Pred-Pre: 53 %
FEV6-PRE: 2.19 L
FEV6-Post: 2.28 L
FEV6FVC-%CHANGE-POST: 1 %
FEV6FVC-%PRED-POST: 107 %
FEV6FVC-%Pred-Pre: 105 %
FVC-%Change-Post: 2 %
FVC-%PRED-PRE: 50 %
FVC-%Pred-Post: 51 %
FVC-Post: 2.29 L
FVC-Pre: 2.22 L
POST FEV1/FVC RATIO: 83 %
PRE FEV6/FVC RATIO: 99 %
Post FEV6/FVC ratio: 100 %
Pre FEV1/FVC ratio: 79 %
RV % pred: 54 %
RV: 1.57 L
TLC % PRED: 50 %
TLC: 3.89 L

## 2017-04-13 LAB — BASIC METABOLIC PANEL
BUN: 47 mg/dL — AB (ref 6–23)
CALCIUM: 8.9 mg/dL (ref 8.4–10.5)
CO2: 30 meq/L (ref 19–32)
Chloride: 98 mEq/L (ref 96–112)
Creatinine, Ser: 1.9 mg/dL — ABNORMAL HIGH (ref 0.40–1.50)
GFR: 36.12 mL/min — AB (ref >60.00)
GLUCOSE: 90 mg/dL (ref 70–99)
Potassium: 4 mEq/L (ref 3.5–5.1)
SODIUM: 139 meq/L (ref 135–145)

## 2017-04-13 LAB — CBC WITH DIFFERENTIAL/PLATELET
BASOS PCT: 0.6 % (ref 0.0–3.0)
Basophils Absolute: 0.1 10*3/uL (ref 0.0–0.1)
EOS ABS: 0 10*3/uL (ref 0.0–0.7)
EOS PCT: 0.3 % (ref 0.0–5.0)
HEMATOCRIT: 45.1 % (ref 39.0–52.0)
HEMOGLOBIN: 14.6 g/dL (ref 13.0–17.0)
LYMPHS PCT: 13.2 % (ref 12.0–46.0)
Lymphs Abs: 2.1 10*3/uL (ref 0.7–4.0)
MCHC: 32.4 g/dL (ref 30.0–36.0)
MCV: 90 fl (ref 78.0–100.0)
MONOS PCT: 7.6 % (ref 3.0–12.0)
Monocytes Absolute: 1.2 10*3/uL — ABNORMAL HIGH (ref 0.1–1.0)
Neutro Abs: 12.3 10*3/uL — ABNORMAL HIGH (ref 1.4–7.7)
Neutrophils Relative %: 78.3 % — ABNORMAL HIGH (ref 43.0–77.0)
PLATELETS: 517 10*3/uL — AB (ref 150.0–400.0)
RBC: 5.01 Mil/uL (ref 4.22–5.81)
RDW: 16.9 % — AB (ref 11.5–15.5)
WBC: 15.7 10*3/uL — ABNORMAL HIGH (ref 4.0–10.5)

## 2017-04-13 LAB — BRAIN NATRIURETIC PEPTIDE: Pro B Natriuretic peptide (BNP): 228 pg/mL — ABNORMAL HIGH (ref 0.0–100.0)

## 2017-04-13 LAB — SEDIMENTATION RATE: Sed Rate: 61 mm/hr — ABNORMAL HIGH (ref 0–20)

## 2017-04-13 LAB — TSH: TSH: 3.78 u[IU]/mL (ref 0.35–4.50)

## 2017-04-13 MED ORDER — PREDNISONE 10 MG PO TABS: ORAL_TABLET | ORAL | 0 refills | Status: DC

## 2017-04-13 MED ORDER — BUDESONIDE-FORMOTEROL FUMARATE 80-4.5 MCG/ACT IN AERO: 2.0000 | INHALATION_SPRAY | Freq: Two times a day (BID) | RESPIRATORY_TRACT | 11 refills | Status: DC

## 2017-04-13 MED ORDER — BUDESONIDE-FORMOTEROL FUMARATE 80-4.5 MCG/ACT IN AERO: 2.0000 | INHALATION_SPRAY | Freq: Two times a day (BID) | RESPIRATORY_TRACT | 0 refills | Status: DC

## 2017-04-13 NOTE — Progress Notes (Signed)
PFT done today. 

## 2017-04-13 NOTE — Patient Instructions (Addendum)
Try symbicort 80 Take 2 puffs first thing in am and then another 2 puffs about 12 hours later.    Only use your albuterol as a rescue medication to be used if you can't catch your breath by resting or doing a relaxed purse lip breathing pattern.  - The less you use it, the better it will work when you need it. - Ok to use up to 2 puffs  every 4 hours if you must but call for immediate appointment if use goes up over your usual need - Don't leave home without it !!  (think of it like the spare tire for your car)   Prednisone taper to 10 mg/ day and hold there   If not able to establish with a local pulmonary doctor who has direct access to the hospital you are using then return here in 4 weeks  To see Tammy NP w/in 4  weeks with all your medications, even over the counter meds, separated in two separate bags, the ones you take no matter what vs the ones you stop once you feel better and take only as needed when you feel you need them. Tammy  will generate for you a new user friendly medication calendar that will put [place] all on the same page re: your medication use.     Please remember to go to the lab and x-ray department downstairs in the basement  for your tests - we will call you with the results when they are available.

## 2017-04-13 NOTE — Progress Notes (Signed)
Subjective:    Patient ID: Brent [name], male    DOB: 07/20/1931,    MRN: [id number]    Brief patient profile:  41  yowm quit smoking in 1978 with cabg late 1990's  No trouble breathing until around summer 2015 then cough onset x 05/2014 with neg cardiac w/u by nishan> referred 09/11/2014  by Brent [name] with PF on CT 08/23/14 and GOLD III critieria for copd       History of Present Illness  09/11/2014 1st [name]/ Brent [name]   Chief Complaint  Patient presents with  . Pulmonary Consult    Referred by Brent [name]. Pt c/o cough, wheezing and SOB for the past 2 months. He states he gets SOB with walking 100 yrds. His cough is mainly non prod but will occ produce some clear sputum.   dyspnea first symptom he noted  x one year just with exertion indolent onset / minimally progressive then coughing x 2-3 months assoc with hb  And since onset of cough having more sob.   Cough not better with saba/ some better p pred/breo rec Try prilosec otc 20mg  x 2  Take 30-60 min before first meal of the day and Pepcid ac (famotidine) 20 mg one @  bedtime until return (over the counter) GERD diet      04/23/2015  f/u ov/Brent [name] re: PF/ gold III codp/ symbiort 160  Chief Complaint  Patient presents with  . Follow-up    Still coughing with occ very light yellow mucus, SOB. Pt denies any wheezing , cp/tightness.  cough tends to be worse after supper despite ppi q am / sleeps ok at hs  No change doe = MMRC1 = can walk nl pace, flat grade, can't hurry or go uphills or steps s sob   rec Try Protonix (pantoprazole) 40 mg Take 30- 60 min before your first and last meals of the day and leave off the pepcid for now Try reduce symbicort 160 one twice daily to see what difference if any this makes with the breathing or coughing   07/24/2015  f/u ov/Brent [name] re:  PF/ GOLD III copd/ symb 160 one bid  Chief Complaint  Patient presents with  . Follow-up    Pt states his cough and SOB have improved some.  He has not had to use proair.   limited more by back/leg pain than limiting sob which if anything is better on symb one bid  rec Try off symbicort Agree with rheumatology evaluation but prefer let [facility] coordinate and advise on use of advil/alleve or alternatives Follow up in Oct 2017 for pfts - call sooner if breathing worse back on symbicort at 2 every 12 hours         09/29/2016  f/u ov/Brent [name] re:  GOLD III copd/  "only here for refills"  Missed appt for pfts Chief Complaint  Patient presents with  . Follow-up    Pt states here for refills on pantoprazole and symbciort. He states his breathing is doing well. He has not had to use albuterol.    doe = MMRC1 = can walk nl pace, flat grade, can't hurry or go uphills or steps s sob   Resume symbicort 1-2 puffs every 12 hours Work on inhaler technique:  Protonix Take 30- 60 min before your first and last meals of the day       01/05/2017  f/u ov/Brent [name] re:  GOLD III copd/ PF  Chief Complaint  Patient presents  with  . Follow-up    Breathing is overall doing well. He is coughing some with white sputum. He states he was txed for bronchitis by his PCP recently.   already finished zpak, / prednisone  Improved/ chronically not taking ppi consistently  Doe back to [facility] =1  rec Plan A = Automatic =   Symbicort  160 Take 2 puffs first thing in am and then another 2 puffs about 12 hours later.  Work on inhaler technique:   Plan B = Backup Only use your albuterol as a rescue medication     04/13/2017  f/u ov/Brent [name] re: post hosp for ? pna in [place]/ no 02 Chief Complaint  Patient presents with  . Follow-up    PFT's done today.  Breathing is overall doing well. He is hoarse today.   Dyspnea:  .MMRC2 = can't walk a nl pace on a flat grade s sob but does fine slow and flat  Cough: no Sleep: great flat   Started on coreg since last ov and finishing levaquin    No obvious day to day or daytime variability or assoc excess/ purulent sputum  or mucus plugs or hemoptysis or cp or chest tightness, subjective wheeze or overt sinus or hb symptoms. No unusual exposure hx or h/o childhood pna/ asthma or knowledge of premature birth.  Sleeping ok flat without nocturnal  or early am exacerbation  of respiratory  c/o's or need for noct saba. Also denies any obvious fluctuation of symptoms with weather or environmental changes or other aggravating or alleviating factors except as outlined above   Current Allergies, Complete Past Medical History, Past Surgical History, Family History, and Social History were reviewed in [employer] record.  ROS  The following are not active complaints unless bolded Hoarseness, sore throat, dysphagia, dental problems, itching, sneezing,  nasal congestion or discharge of excess mucus or purulent secretions, ear ache,   fever, chills, sweats, unintended wt loss or wt gain, classically pleuritic or exertional cp,  orthopnea pnd or leg swelling, presyncope, palpitations, abdominal pain, anorexia, nausea, vomiting, diarrhea  or change in bowel habits or change in bladder habits, change in stools or change in urine, dysuria, hematuria,  rash, arthralgias, visual complaints, headache, numbness, weakness or ataxia or problems with walking or coordination,  change in mood/affect or memory.        Current Meds  Medication Sig  . albuterol (PROAIR HFA) 108 (90 BASE) MCG/ACT inhaler Inhale 2 puffs into the lungs every 6 (six) hours as needed for wheezing or shortness of breath.  . Ascorbic Acid (VITAMIN C) 1000 MG tablet Take 1,000 mg by mouth daily.  Marland Kitchen aspirin EC 81 MG tablet Take 81 mg by mouth daily.  . carvedilol (COREG) 3.125 MG tablet Take 1 tablet by mouth 2 (two) times daily.  . diphenhydrAMINE (ALLERGY) 25 MG tablet Take 25 mg by mouth as needed.  . DULoxetine (CYMBALTA) 60 MG capsule Take 60 mg by mouth daily.  . furosemide (LASIX) 40 MG tablet TAKE 1 TABLET BY MOUTH 3 TIMES A WEEK  .  GuaiFENesin (MUCINEX CHEST CONGESTION CHILD PO) Take by mouth.  . levofloxacin (LEVAQUIN) 750 MG tablet Take 1 tablet by mouth daily.  . meclizine (ANTIVERT) 25 MG tablet Take 25 mg by mouth as needed.  . Multiple Vitamins-Minerals (MENS 50+ ADVANCED PO) Take 1 tablet by mouth daily.  . nitroGLYCERIN (NITROSTAT) 0.4 MG SL tablet PLACE 1 TABLET UNDER THE TONGUE EVERY 5 MINUTES AS NEEDED FOR  CHEST PAIN UP TO 3 DOSES, IF SYMPTOMS PERSIST CALL 911  . potassium chloride (K-DUR) 10 MEQ tablet TAKE 1 TABLET BY MOUTH 3 TIMES PER WEEK,MONDAY, WEDNESDAY, AND FRIDAY  . rosuvastatin (CRESTOR) 40 MG tablet Take 1 tablet (40 mg total) by mouth daily.  . [  budesonide-formoterol (SYMBICORT) 160-4.5 MCG/ACT inhaler INHALE 2 PUFFS FIRST THING IN THE MORNING AND ANOTHER 2 PUFFS ABOUT 12 HOURS LATER  . [  predniSONE (DELTASONE) 10 MG tablet AS DIRECTED  .                                      Objective:   Physical Exam  Chronically ill appearing amb wm nad   10/23/2014       250 >248 11/14/2014 > 12/04/2014    248  > 04/23/2015   261 > 07/24/2015  258 > 09/29/2016   249 > 01/05/2017  240 > 01/05/2017   240 > 04/13/2017   234  Vital signs reviewed - Note on arrival 02 sats  97% on RA   - note recorded as 30 but actual pulse on ekg = around 75 with freq pvc's     HEENT: nl  turbinates bilaterally, and oropharynx. Nl external ear canals without cough reflex - poor dentition   NECK :  without JVD/Nodes/TM/ nl carotid upstrokes bilaterally   LUNGS: no acc muscle use,  Nl contour chest with insp crackles both bases/ min insp / exp rhonchi and no cough on insp or exp  CV:  RRR  no s3 or murmur or increase in P2, and  No edema ABD:  soft and nontender with nl inspiratory excursion in the supine position. No bruits or organomegaly appreciated, bowel sounds nl  MS:  Nl gait/ ext warm without deformities, calf tenderness, cyanosis  - yellow nails  No obvious joint restrictions   SKIN: warm and dry  without lesions    NEURO:  alert, approp, nl sensorium with  no motor or cerebellar deficits apparent.          CXR PA and Lateral:   04/13/2017 :    I personally reviewed images and agree with radiology impression as follows:    Patchy reticular opacities throughout both lungs, not appreciably changed, favor chronic interstitial lung disease, as detailed on 08/23/2014 high-resolution chest CT. No convincing superimposed pulmonary edema. Heart size appears top normal.   Labs ordered/ reviewed:      Chemistry      Component Value Date/Time   NA 139 04/13/2017 1440   NA 138 02/25/2017 1140   K 4.0 04/13/2017 1440   CL 98 04/13/2017 1440   CO2 30 04/13/2017 1440   BUN 47 (H) 04/13/2017 1440   BUN 22 02/25/2017 1140   CREATININE 1.90 (H) 04/13/2017 1440   CREATININE 1.54 (H) 2020/09/815 1553      Component Value Date/Time   CALCIUM 8.9 04/13/2017 1440   ALKPHOS 89 02/25/2017 1140   AST 19 02/25/2017 1140   ALT 10 02/25/2017 1140   BILITOT 0.8 02/25/2017 1140        Lab Results  Component Value Date   WBC 15.7 (H) 04/13/2017   HGB 14.6 04/13/2017   HCT 45.1 04/13/2017   MCV 90.0 04/13/2017   PLT 517.0 (H) 04/13/2017      Lab Results  Component Value Date   TSH 3.78 04/13/2017     Lab Results  Component Value Date   PROBNP 228.0 (H) 04/13/2017       Lab Results  Component Value Date   ESRSEDRATE 61 (H) 04/13/2017   ESRSEDRATE 47 (H) 11/14/2014   ESRSEDRATE 14 07/14/2008         ekg 04/13/2017  SR with freq pvc's and old inf MI       Assessment & Plan:

## 2017-04-14 ENCOUNTER — Other Ambulatory Visit: Payer: Self-pay

## 2017-04-14 NOTE — Progress Notes (Signed)
Spoke with pt and notified of results per Dr. Wert. Pt verbalized understanding and denied any questions. 

## 2017-04-14 NOTE — Patient Outreach (Signed)
[facility]) Care Management  04/14/2017  Brent [name] 09/14/1931 [id number]  Subjective: client reports feeling better since discharged. He reports he was treated in the hospital for pneumia and then was readmitted, but is not sure what the issue was. He reports Prednisone is a new medication and he is also on antibiotics.  Objective: none  Assessment: Received referral [facility] [employer] recent discharge from [facility] on 04/09/17. 85 year old with history of heart fialure, COPD, GERD, Left hip pain, HTN.  RNCM called for transition of care. Client reports he is living with his daughter in [place] at this time. Continues to follow up with his providers in [place]. Client last saw Dr. [name] for pulmonary on yesterday. He has not scheduled a visit with his primary care yet.  RNCM discussed medications. Client denies any questions or issues with medications. He states his daughter helps him manage his medications.  RNCM discussed Transition of care program and client is agreeable to telephonic care coordination as client is not living locally at this time. He reports he has been with his daughter for about 4 months due to major repairs on his home pending.  Plan: refer to telephonic care coordinator.  Thea Silversmith, RN, MSN, [place] Coordinator Cell: [phone]

## 2017-04-15 ENCOUNTER — Encounter: Payer: Self-pay | Admitting: Internal Medicine

## 2017-04-15 NOTE — Assessment & Plan Note (Signed)
PFT's 12/04/2014 mostly restrictive but ratio 64% so quite mild relative to PF component  - 12/04/2014   75% so try symbicort 160 2bid  - 04/23/2015  extensive coaching HFA effectiveness =    90% from a baseline of 75%  - 07/24/2015 try off symbicort at pt's request > can take up to 2 pffs q 12h prn  - 01/05/2017  After extensive coaching HFA effectiveness =    75%   - PFT's  04/13/2017  FEV1 1.89 (60 % ) ratio 83  p 7 % improvement from saba p symb 160 2 prior to study with DLCO  39 % corrects to 81 % for alv volume   - 04/13/2017  After extensive coaching inhaler device  effectiveness =    75% on symb 80 2bid and taper prednisone to 10 mg per day and hold there   He appears to have some reversible component clinically but since required prednisone for ILD any way and may have component of upper airway cough rec try the lower strength symbicort = 80 2bid on trial basis then return with all meds in hand using a trust but verify approach to confirm accurate Medication  Reconciliation The principal here is that until we are certain that the  patients are doing what we've asked, it makes no sense to ask them to do more.

## 2017-04-15 NOTE — Assessment & Plan Note (Signed)
ekg c/w sr with pvc's and old inf mi, no acute changes > ok to continue low dose coreg for now

## 2017-04-15 NOTE — Assessment & Plan Note (Signed)
-  HRCT 08/23/14  1. Pulmonary parenchymal pattern of fibrosis is likely due to usual interstitial pneumonitis (UIP). 2. Enlarged pulmonary arteries, indicative of pulmonary arterial Hypertension. -09/11/2014  Walked RA x 3 laps @ 185 ft each stopped due to End of study, nl pace, no sob or desat  - PFT's  12/04/2014  FEV1 1.39 (43 % ) ratio 64  p no % improvement from saba with DLCO  53 % corrects to 111 % for alv volume  And ERV 20 - 04/23/2015  Walked RA x 3 laps @ 185 ft each stopped due to  End of study, nl pace, no sob or desat    - 04/13/2017 improved on prednisone 04/13/2017 but ESR still 61 so rec try taper to pred 10 mg daily and hold there  Not clear whether really had pna or flare of PF. The goal with a chronic steroid dependent illness is always arriving at the lowest effective dose that controls the disease/symptoms and not accepting a set "formula" which is based on statistics or guidelines that don't always take into account patient  variability or the natural hx of the dz in every individual patient, which may well vary over time.  For now therefore I recommend the patient maintain  A floor of 10 mg daily prednisone   

## 2017-04-15 NOTE — Assessment & Plan Note (Signed)
  Likely multifactorial - no anemia or thyroid dz > see copd/ PF

## 2017-04-20 DIAGNOSIS — J441 Chronic obstructive pulmonary disease with (acute) exacerbation: Secondary | ICD-10-CM | POA: Diagnosis not present

## 2017-04-20 DIAGNOSIS — I5033 Acute on chronic diastolic (congestive) heart failure: Secondary | ICD-10-CM | POA: Diagnosis not present

## 2017-04-20 DIAGNOSIS — Z6831 Body mass index (BMI) 31.0-31.9, adult: Secondary | ICD-10-CM | POA: Diagnosis not present

## 2017-04-23 ENCOUNTER — Other Ambulatory Visit: Payer: Self-pay

## 2017-04-23 NOTE — Patient Outreach (Signed)
Brent [name]  04/23/2017  Brent [name] Feb 10, 1932 [id number]   Subjective: "oh I ,am doing great".  Objective: none  Assessment: recent discharge from [facility] on 04/09/17. 84 year old with history of heart fialure, COPD, GERD, Left hip pain, HTN.  RNCM called to follow up. Brent [name] states he is in the car riding with his son-in-law. Client denies any issues at this time. He is without any questions or concerns. He reports he saw his primary care provider this past Monday. Client's mailing address confirmed.  Plan: follow up telephonically next week, send educational material on COPD.  [facility] CM Care Plan Problem One     Most Recent Value  Care Plan Problem One  at risk for readmission as evidence by recent admission, mulitple comorbidities  Role Documenting the Problem One  Care [name] [place] for Problem One  Active  [facility] Long Term Goal   client will not be readmitted within the next 31-45 days.  [facility] Long Term Goal Start Date  04/14/17  Interventions for Problem One Long Term Goal  RNCM called to follow up, encouraged client to call RNCM as needed, provided positve feedback on self care managment of illness.  [facility] CM Short Term Goal #1   client will call to schedule follow up visit with primary care provider within the next 1-2 weeks.  [facility] CM Short Term Goal #1 Start Date  04/14/17  [facility] CM Short Term Goal #1 Met Date  04/23/17  [facility] CM Short Term Goal #2   client will call RNCM and/or nurse advice line as appropriate within the next 30 days.  Interventions for Short Term Goal #2  continued encouragement to call as needed.    [facility] CM Care Plan Problem Two     Most Recent Value  Care Plan Problem Two  knowledge deficit regarding COPD [name]/action plan per client request for education on COPD.  Role Documenting the Problem Two  Care [name] [place] for Problem Two  Active  Interventions  for Problem Two Long Term Goal   Education material mailed to client.  [place] Term Goal  client will verbalize at least three strategies for COPD [name] within the next 31-45 days.  [place] Long Term Goal Start Date  04/23/17     Brent Silversmith, RN, MSN, [place] Coordinator Cell: [phone]

## 2017-05-01 ENCOUNTER — Other Ambulatory Visit: Payer: Self-pay

## 2017-05-01 NOTE — Patient Outreach (Signed)
[facility] [place]) Care Management  05/01/2017  Brent [name] [birth year] [id number]  Subjective: "I am doing great".  Objective: none  Assessment:recent discharge from [facility] on 04/09/17. 84 year old with history of heart fialure, COPD, GERD, Left hip pain, HTN.  Received return call from client who reports he is doing good. He states, that he will be having a procedure by Dr. Rosana Hoes, urologist at [facility] next Tuesday 05/12/17. He denies any pain.   He state reports an Upcoming appointment with pulmonology provider, Carolynn Serve on 05/11/17. He denies shortness of breath, denies cough or sputum production.  Plan: follow up next week telephonically.  Thea Silversmith, RN, MSN, [place] Coordinator Cell: [phone]

## 2017-05-01 NOTE — Patient Outreach (Signed)
[place] [facility]) Care Management  05/01/2017  XZAVIAN SEMMEL [birth year] [id number]   Subjective: none  Objective: none  Assessment: Assessment: recent discharge from [facility] on 04/09/17. 84 year old with history of heart fialure, COPD, GERD, Left hip pain, HTN.  RNCM called to follow up. No answer. HIPPA compliant message left.  Plan: if no return call, follow up next business day.  Thea Silversmith, RN, MSN, [place] Coordinator Cell: [phone]

## 2017-05-04 ENCOUNTER — Ambulatory Visit: Payer: PPO

## 2017-05-04 ENCOUNTER — Telehealth: Payer: Self-pay | Admitting: Cardiovascular Disease

## 2017-05-04 NOTE — Telephone Encounter (Signed)
Left message for patient to call back to clarify whether he needs a refill or if he was calling to give [place] this information.

## 2017-05-04 NOTE — Telephone Encounter (Signed)
New Message:    Pt c/o medication issue:  1. Name of Medication: Carvedilol (new medication)  2. How are you currently taking this medication (dosage and times per day)? 125 mg: twice a day with meals  3. Are you having a reaction (difficulty breathing--STAT)? No  4. What is your medication issue? Pt was at [facility] and md there prescribed this medication   New Pharmacy: [facility] on [employer]

## 2017-05-05 ENCOUNTER — Telehealth: Payer: Self-pay | Admitting: Cardiovascular Disease

## 2017-05-05 NOTE — Telephone Encounter (Signed)
Spoke with patient as Dr Acie Fredrickson has never filled this for the patient. Per patient, this was prescribed to him while he was in the hospital in [place]. Okay to refill? Please advise. Thanks, MI

## 2017-05-05 NOTE — Telephone Encounter (Signed)
°*  STAT* If patient is at the pharmacy, call can be transferred to refill team.   1. Which medications need to be refilled? (please list name of each medication and dose if known) Carvedilol 3.125 mg  2. Which pharmacy/location (including street and city if local pharmacy) is medication to be sent to? [place], [place],  [id number]   3. Do they need a 30 day or 90 day supply? [place]

## 2017-05-06 ENCOUNTER — Other Ambulatory Visit: Payer: Self-pay | Admitting: *Deleted

## 2017-05-06 MED ORDER — CARVEDILOL 3.125 MG PO TABS: 3.1250 mg | ORAL_TABLET | Freq: Two times a day (BID) | ORAL | 2 refills | Status: DC

## 2017-05-06 NOTE — Telephone Encounter (Signed)
OK to continue Coreg 3.125 BID

## 2017-05-08 ENCOUNTER — Other Ambulatory Visit: Payer: Self-pay

## 2017-05-08 NOTE — Patient Outreach (Signed)
[facility]) Care Management  05/08/2017  CASIMER RUSSETT January 16, 1932 [id number]   Assessment: discharge from [facility] on 04/09/17. Client reports recently hospitalized with pneumonia.  84 year old with history of heart failure, COPD, GERD, Left hip pain, HTN.  RNCM called for transition of care follow up. No answer. HIPPA compliant message left.  Plan: RNCM will follow up next week if no return call.  Thea Silversmith, RN, MSN, [place] Coordinator Cell: [phone]

## 2017-05-11 ENCOUNTER — Ambulatory Visit (INDEPENDENT_AMBULATORY_CARE_PROVIDER_SITE_OTHER): Payer: PPO | Admitting: Adult Health

## 2017-05-11 ENCOUNTER — Encounter: Payer: Self-pay | Admitting: Adult Health

## 2017-05-11 DIAGNOSIS — J8410 Pulmonary fibrosis, unspecified: Secondary | ICD-10-CM

## 2017-05-11 DIAGNOSIS — J449 Chronic obstructive pulmonary disease, unspecified: Secondary | ICD-10-CM | POA: Diagnosis not present

## 2017-05-11 MED ORDER — ALBUTEROL SULFATE HFA 108 (90 BASE) MCG/ACT IN AERS: 2.0000 | INHALATION_SPRAY | Freq: Four times a day (QID) | RESPIRATORY_TRACT | 5 refills | Status: AC | PRN

## 2017-05-11 NOTE — Assessment & Plan Note (Signed)
Stable without flare   Plan  Patient Instructions  Continue on current regimen Follow med calendar closely and bring to each visit Follow-up with Dr. [name] in 3 months and as needed

## 2017-05-11 NOTE — Assessment & Plan Note (Signed)
Controlled on current regimen   Plan  Patient Instructions  Continue on current regimen Follow med calendar closely and bring to each visit Follow-up with Dr. [name] in 3 months and as needed

## 2017-05-11 NOTE — Patient Instructions (Signed)
Continue on current regimen Follow med calendar closely and bring to each visit Follow-up with Dr. [name] in 3 months and as needed

## 2017-05-11 NOTE — Progress Notes (Signed)
$'@Patient'Z$  ID: Brent [name], male    DOB: [birth year], 84 y.o.   MRN: [id number]  Chief Complaint  Patient presents with  . Follow-up    COPD     Referring provider: Maury Dus, MD  HPI: 84 year old male former smoker followed for gold 3 COPD and pulmonary fibrosis  TEST  PFT's 12/04/2014 mostly restrictive but ratio 64% so quite mild relative to PF component  - PFT's  04/13/2017  FEV1 1.89 (60 % ) ratio 83  p 7 % improvement from saba p symb 160 2 prior to study with DLCO  39 % corrects to 81 % for alv volume   HRCT 08/23/14  1. Pulmonary parenchymal pattern of fibrosis is likely due to usual interstitial pneumonitis (UIP). 2. Enlarged pulmonary arteries, indicative of pulmonary arterial Hypertension.   - PFT's  12/04/2014  FEV1 1.39 (43 % ) ratio 64  p no % improvement from saba with DLCO  53 % corrects to 111 % for alv volume  And ERV 20  - 04/13/2017 improved on prednisone 04/13/2017 but ESR still 61 so rec try taper to pred 10 mg daily and hold there   05/11/2017 Follow up : COPD , PF  Patient presents for a one-month follow-up.  Patient has underlying moderate COPD. He remains on Symbicort twice daily. Now on Symbicort 80, no change in breathing on lower dose. Overall breathing has been doing better. Did have coughing episode 2 days ago, felt it was reflux related.  Starting get better.   Patient has underlying pulmonary fibrosis.  Previous CT chest 2016 showed peripheral and basilar predominant pattern of subpleural reticulation, traction bronchiectasis and mild honeycombing. PFT done last month showed stable lung function however DLCO had decreased from 53% to 39%.. Patient is on chronic steroids at prednisone 10 mg daily.. Patient is not on oxygen. Says activity level is the same ,   We reviewed his meds and organize them into a patient medication calendar.  Appears to be taking correctly.  Allergies  Allergen Reactions  . Morphine Itching and Hives  . Morphine And  Related     Itch     Immunization History  Administered Date(s) Administered  . Influenza Split 11/24/2013  . Influenza Whole 11/24/2016  . Influenza,inj,Quad PF,6+ Mos 12/04/2014  . Pneumococcal Conjugate-13 11/24/2016  . Pneumococcal Polysaccharide-23 06/03/2013, 10/25/2016    Past Medical History:  Diagnosis Date  . AAA (abdominal aortic aneurysm) ([place])   . Arthritis   . Back pain   . CAD (coronary artery disease)    Status post CABG  . COPD (chronic obstructive pulmonary disease) ([place])   . Dizziness    takes Antivert daily  . Erectile dysfunction   . GERD (gastroesophageal reflux disease)    takes Omeprazole daily  . History of blood clots    to the lungs   . HTN (hypertension)    takes Amlodipine and Micardis daily  . Hx of cardiovascular stress test    Lexiscan Myoview (2/16):  Possible apical infarct, no ischemia, EF 51%; Low Risk  . Hyperlipidemia    takes Crestor daily  . Myocardial infarction ([place])   . Pulmonary embolism (H[facility])     Tobacco History: Social History   Tobacco Use  Smoking Status Former Smoker  . Packs/day: 1.00  . Years: 20.00  . Pack years: 20.00  . Types: Cigarettes  . Last attempt to quit: 02/25/1976  . Years since quitting: 41.2  Smokeless Tobacco Never Used   Counseling given:  Not Answered   Outpatient Encounter Medications as of 05/11/2017  Medication Sig  . albuterol (PROAIR HFA) 108 (90 Base) MCG/ACT inhaler Inhale 2 puffs into the lungs every 6 (six) hours as needed for wheezing or shortness of breath.  . Ascorbic Acid (VITAMIN C) 1000 MG tablet Take 1,000 mg by mouth daily.  [place] aspirin EC 81 MG tablet Take 81 mg by mouth daily.  . budesonide-formoterol (SYMBICORT) 80-4.5 MCG/ACT inhaler Inhale 2 puffs into the lungs 2 (two) times daily.  . carvedilol (COREG) 3.125 MG tablet Take 1 tablet (3.125 mg total) by mouth 2 (two) times daily.  . diphenhydrAMINE (ALLERGY) 25 MG tablet Take 25 mg by mouth as needed.  . DULoxetine  (CYMBALTA) 60 MG capsule Take 60 mg by mouth daily.  . furosemide (LASIX) 40 MG tablet TAKE 1 TABLET BY MOUTH 3 TIMES A WEEK  . GuaiFENesin (MUCINEX CHEST CONGESTION CHILD PO) Take by mouth.  . meclizine (ANTIVERT) 25 MG tablet Take 25 mg by mouth as needed.  . Multiple Vitamins-Minerals (MENS 50+ ADVANCED PO) Take 1 tablet by mouth daily.  . nitroGLYCERIN (NITROSTAT) 0.4 MG SL tablet PLACE 1 TABLET UNDER THE TONGUE EVERY 5 MINUTES AS NEEDED FOR CHEST PAIN UP TO 3 DOSES, IF SYMPTOMS PERSIST CALL 911  . potassium chloride (K-DUR) 10 MEQ tablet TAKE 1 TABLET BY MOUTH 3 TIMES PER WEEK,MONDAY, WEDNESDAY, AND FRIDAY  . predniSONE (DELTASONE) 10 MG tablet Take one daily - take two if breathing worse, both at breakfast  . rosuvastatin (CRESTOR) 40 MG tablet Take 1 tablet (40 mg total) by mouth daily.  . [DISCONTINUED] albuterol (PROAIR HFA) 108 (90 BASE) MCG/ACT inhaler Inhale 2 puffs into the lungs every 6 (six) hours as needed for wheezing or shortness of breath.  . [DISCONTINUED] levofloxacin (LEVAQUIN) 750 MG tablet Take 1 tablet by mouth daily.   No facility-administered encounter medications on file as of 05/11/2017.      Review of Systems  Constitutional:   No  weight loss, night sweats,  Fevers, chills,  +fatigue, or  lassitude.  HEENT:   No headaches,  Difficulty swallowing,  Tooth/dental problems, or  Sore throat,                No sneezing, itching, ear ache, nasal congestion, post nasal drip,   CV:  No chest pain,  Orthopnea, PND, swelling in lower extremities, anasarca, dizziness, palpitations, syncope.   GI  No heartburn, indigestion, abdominal pain, nausea, vomiting, diarrhea, change in bowel habits, loss of appetite, bloody stools.   Resp:  No wheezing.  No chest wall deformity  Skin: no rash or lesions.  GU: no dysuria, change in color of urine, no urgency or frequency.  No flank pain, no hematuria   MS:  No joint pain or swelling.  No decreased range of motion.  No back  pain.    Physical Exam  BP 114/76 (BP Location: Right Arm, Cuff Size: Normal)   Pulse 83   Ht 6' 1"  (1.854 m)   Wt 231 lb 9.6 oz (105.1 kg)   SpO2 92%   BMI 30.56 kg/m   GEN: A/Ox3; pleasant , NAD, well nourished    HEENT:  [place]/AT,  EACs-clear, TMs-wnl, NOSE-clear, THROAT-clear, no lesions, no postnasal drip or exudate noted.   NECK:  Supple w/ fair ROM; no JVD; normal carotid impulses w/o bruits; no thyromegaly or nodules palpated; no lymphadenopathy.    RESP  Decreased BS in bases ,  no accessory muscle use, no  dullness to percussion  CARD:  RRR, no m/r/g, no peripheral edema, pulses intact, no cyanosis or clubbing.  GI:   Soft & nt; nml bowel sounds; no organomegaly or masses detected.   Musco: Warm bil, no deformities or joint swelling noted.   Neuro: alert, no focal deficits noted.    Skin: Warm, no lesions or rashes    Lab Results:    BNP No results found for: BNP   Imaging:w Dg Chest 2 View  Result Date: 04/13/2017 CLINICAL DATA:  Chronic dyspnea and cough.  Former smoker. EXAM: CHEST  2 VIEW COMPARISON:  02/25/2017 chest radiograph. FINDINGS: Intact sternotomy wires. CABG clips overlie the mediastinum. Partially visualized right shoulder arthroplasty. Stable cardiomediastinal silhouette with top normal heart size. No pneumothorax. No pleural effusion. Patchy reticular opacities throughout both lungs, most prominent in the upper right and lower left lungs, not appreciably changed. No acute consolidative airspace disease. IMPRESSION: Patchy reticular opacities throughout both lungs, not appreciably changed, favor chronic interstitial lung disease, as detailed on 08/23/2014 high-resolution chest CT. No convincing superimposed pulmonary edema. Heart size appears top normal. Electronically Signed   By: Ilona Sorrel M.D.   On: 04/13/2017 20:55     Assessment & Plan:   COPD GOLD III  Controlled on current regimen   Plan  Patient Instructions  Continue on  current regimen Follow med calendar closely and bring to each visit Follow-up with Dr. [name] in 3 months and as needed     Postinflammatory pulmonary fibrosis ([place]) Stable without flare   Plan  Patient Instructions  Continue on current regimen Follow med calendar closely and bring to each visit Follow-up with Dr. [name] in 3 months and as needed        Rexene Edison, NP 05/11/2017

## 2017-05-12 DIAGNOSIS — N471 Phimosis: Secondary | ICD-10-CM | POA: Diagnosis not present

## 2017-05-12 DIAGNOSIS — N478 Other disorders of prepuce: Secondary | ICD-10-CM | POA: Diagnosis not present

## 2017-05-12 DIAGNOSIS — N433 Hydrocele, unspecified: Secondary | ICD-10-CM | POA: Diagnosis not present

## 2017-05-12 NOTE — Progress Notes (Signed)
Chart and office note reviewed in detail  > agree with a/p as outlined    

## 2017-05-13 ENCOUNTER — Ambulatory Visit: Payer: Self-pay

## 2017-05-13 NOTE — Addendum Note (Signed)
Addended by: Parke Poisson E on: 05/13/2017 05:28 PM   Modules accepted: Orders

## 2017-05-14 ENCOUNTER — Telehealth: Payer: Self-pay | Admitting: Internal Medicine

## 2017-05-14 NOTE — Telephone Encounter (Signed)
Called and spoke with pt's daughter Butch Penny who stated pt has been wheezing and is coughing with brown mucus.  Pt's symptoms started around 4am this morning.  Pt had minor surgery Tuesday and after it was noted that pt would not be intubated during the surgery, pt was in fact intubated after told they would not do that.  Butch Penny states pt has been afebrile and states if a Rx could be called in to help with pt's symptoms that would be good.  Pt is on prednisone and takes 10mg  daily.  Due to Dr. [name] being out of the office, will send to DOD.  Dr. Elsworth Soho, please advise on the above.  Allergies  Allergen Reactions  . Morphine Itching and Hives  . Morphine And Related     Itch      Current Outpatient Medications:  .  albuterol (PROAIR HFA) 108 (90 Base) MCG/ACT inhaler, Inhale 2 puffs into the lungs every 6 (six) hours as needed for wheezing or shortness of breath., Disp: 1 Inhaler, Rfl: 5 .  Ascorbic Acid (VITAMIN C) 1000 MG tablet, Take 1,000 mg by mouth daily., Disp: , Rfl:  .  aspirin EC 81 MG tablet, Take 81 mg by mouth at bedtime. , Disp: , Rfl:  .  budesonide-formoterol (SYMBICORT) 80-4.5 MCG/ACT inhaler, Inhale 2 puffs into the lungs 2 (two) times daily., Disp: 1 Inhaler, Rfl: 11 .  carvedilol (COREG) 3.125 MG tablet, Take 1 tablet (3.125 mg total) by mouth 2 (two) times daily., Disp: 180 tablet, Rfl: 2 .  cetirizine (ZYRTEC) 10 MG tablet, Take 10 mg by mouth at bedtime., Disp: , Rfl:  .  diphenhydrAMINE (ALLERGY) 25 MG tablet, Take 25 mg by mouth as needed., Disp: , Rfl:  .  DULoxetine (CYMBALTA) 60 MG capsule, Take 60 mg by mouth daily., Disp: , Rfl:  .  furosemide (LASIX) 40 MG tablet, TAKE 1 TABLET BY MOUTH 3 TIMES A WEEK, Disp: 36 tablet, Rfl: 3 .  guaiFENesin (MUCINEX) 600 MG 12 hr tablet, Take 600 mg by mouth 2 (two) times daily., Disp: , Rfl:  .  meclizine (ANTIVERT) 25 MG tablet, Take 25 mg by mouth as needed., Disp: , Rfl:  .  Multiple Vitamins-Minerals (MENS 50+ ADVANCED PO),  Take 1 tablet by mouth daily., Disp: , Rfl:  .  nitroGLYCERIN (NITROSTAT) 0.4 MG SL tablet, PLACE 1 TABLET UNDER THE TONGUE EVERY 5 MINUTES AS NEEDED FOR CHEST PAIN UP TO 3 DOSES, IF SYMPTOMS PERSIST CALL 911, Disp: 25 tablet, Rfl: 3 .  pantoprazole (PROTONIX) 40 MG tablet, Take 40 mg by mouth 2 (two) times daily before a meal., Disp: , Rfl:  .  potassium chloride (K-DUR) 10 MEQ tablet, TAKE 1 TABLET BY MOUTH 3 TIMES PER WEEK,MONDAY, WEDNESDAY, AND FRIDAY, Disp: 36 tablet, Rfl: 3 .  predniSONE (DELTASONE) 10 MG tablet, Take one daily - take two if breathing worse, both at breakfast, Disp: 100 tablet, Rfl: 0 .  rosuvastatin (CRESTOR) 40 MG tablet, Take 1 tablet (40 mg total) by mouth daily., Disp: 30 tablet, Rfl: 9

## 2017-05-14 NOTE — Telephone Encounter (Signed)
Called and spoke with pt's daughter Butch Penny and stated to her if unable to come in for an OV take 2 tabs of 10mg  prednisone x5 days and then go back to the 1 tab daily of prednisone.  Butch Penny stated to me she would just have her father (pt) do that and if she felt like he should come in for an OV then she would call our office.  Nothing further needed at this current time.

## 2017-05-14 NOTE — Telephone Encounter (Signed)
Ideally needs OV to address If unable, Prednisone 10 mg tabs  Take 2 tabs daily with food x 5ds, then back to 1 tab daily

## 2017-05-15 ENCOUNTER — Other Ambulatory Visit: Payer: Self-pay | Admitting: *Deleted

## 2017-05-15 ENCOUNTER — Encounter: Payer: Self-pay | Admitting: *Deleted

## 2017-05-15 ENCOUNTER — Ambulatory Visit: Payer: Self-pay

## 2017-05-15 NOTE — Patient Outreach (Signed)
[place] [facility]) Care Management [place] Telephone Outreach, Transition of Care day 32  05/15/2017  Brent [name] April 04, 1931 [id number]  Successful telephone outreach to Brent [name], 84 y/o male referred to [facility] CM for transition of care after recent hospitalization in February 2019 for COPD exacerbation.  Patient has history including, but not limited to, heart fialure, COPD, GERD, Left hip pain, HTN.  HIPAA/ identity verified with patient during phone call today.  Explained that I was contacting patient for primary [facility] RN CM [place].  Today, patient reports he is "doing real good;" and he denies pain, problems/ concerns.  States that his daughter contacted his "lung doctor" yesterday because he woke up having increased coughing with "brown discolored mucous."  Reports that lung doctor advised him to use his inhaler and to increase his prednisone for the next several days, both of which patient reports he has done; reports that he "is much better today" than yesterday, and states he is no longer experiencing increased coughing or discolored mucous.  Patient further reports: -- no issues/ concerns/ questions around current medications -- no recent shortness of breath, "outside of normal with activity" -- attended out patient procedure 05/12/17 for hydrocele repair; states procedure "went well;" and he denies pain/ concerns post-procedure; states he "is just a little sore at times" -- reports that he continues staying with his daughter who lives in [place], [place], but this afternoon will be going to stay with his son "just outside of [place];"  States that daughter will be driving patient to meet son to give daughter a break; expects to stay with son "for about a week," -- reiterated COPD zones/ corresponding action plans with patient; encouraged patient to promptly notify care providers for any changes, just as he did yesterday.  Patient verbalizes understanding and agreement,  and denies questions. -- discussed with patient that Denton Brick had recommended [place] referral for patient moving forward; patient is agreeable to this referral being placed today.  Patient denies further issues, concerns, or problems today.  I confirmed that patient has the [facility] CM 24-hour nurse advice phone number should issues arise prior to next [place] outreach by [employer].  Explained that I would update primary [facility] RN CCM of today's successful call with him, and encouraged patient to engage with [place]; patient verbalized understanding and agreement with this plan.  Plan:  Will place referral to [place] RN health coach, and will update patient's primary [facility] RN CM of today's successful telephone outreach to patient  [facility] CM Care Plan Problem One     Most Recent Value  Care Plan Problem One  at risk for readmission as evidence by recent admission, mulitple comorbidities  Role Documenting the Problem One  Care Management [place] for Problem One  Not Active  [facility] Long Term Goal   client will not be readmitted within the next 31-45 days.  [facility] Long Term Goal Start Date  04/14/17  [facility] Long Term Goal Met Date  05/15/17- Goal met  Interventions for Problem One Long Term Goal  Confirmed with patient that he has not experienced hospital readmission within 31 days of last discharge  [facility] CM Short Term Goal #1   client will call to schedule follow up visit with primary care provider within the next 1-2 weeks.  [facility] CM Short Term Goal #1 Start Date  04/14/17  [facility] CM Short Term Goal #1 Met Date  04/23/17  [facility] CM Short Term Goal #2   client will call RNCM and/or nurse advice line as appropriate within the next 30 days.  [facility] CM Short Term Goal #2 Met Date  05/15/17 -Goal met  Interventions for Short Term Goal #2  Confirmed with patient that he has these numbers and has not needed to use over last 30 days    [facility] CM Care Plan Problem Two     Most Recent  Value  Care Plan Problem Two  knowledge deficit regarding COPD management/action plan per client request for education on COPD.  Role Documenting the Problem Two  Care Management Coordinator  Care Plan for Problem Two  Active  Interventions for Problem Two Long Term Goal   Discussed patient's recent clinical status, with increased coughing/ discoloration of mucous,  provided positive reinforcement for promptly contacting pulmonary provider about his symptoms, and reviewed medication changes made as a result of the call placed to provider,  discussed referral to [place] health coach, and placed this referral today  [place]  client will verbalize at least three strategies for COPD management within the next 31-45 days.  [facility] Long Term Goal Start Date  04/23/17     Oneta Rack, RN, BSN, [place] Coordinator [facility] Care Management  [phone]

## 2017-05-20 ENCOUNTER — Encounter: Payer: Self-pay | Admitting: *Deleted

## 2017-05-21 ENCOUNTER — Other Ambulatory Visit: Payer: Self-pay | Admitting: *Deleted

## 2017-05-21 NOTE — Patient Outreach (Signed)
[place] [facility]) Care Management  05/21/2017  MIHRAN LEBARRON [birth year] [id number]   [place] Introduction Call  Referral Date:  05/15/2017 Referral Source:  Transfer from TOC/Community Nurse Reason for Referral:  Continued Disease Management Education Insurance:  [employer]   Outreach Attempt:  Outreach attempt #1 to patient for introductory call. No answer. RN Health Coach left HIPAA compliant voicemail message along with contact information.  Plan:  RN Health Coach will make another outreach attempt to patient within one business day if no return call back from patient.   [place] [phone] [email or web address]

## 2017-05-22 ENCOUNTER — Other Ambulatory Visit: Payer: Self-pay | Admitting: *Deleted

## 2017-05-22 NOTE — Patient Outreach (Signed)
[place] [facility]) Care Management  05/22/2017  Brent [name] 1931/07/18 [id number]   [place] Introduction Call  Referral Date:  05/15/2017 Referral Source:  Transfer from TOC/Community Nurse Reason for Referral:  Continued Disease Management Education Insurance:  [employer]   Outreach Attempt:  Successful telephone outreach to patient for introduction call.  HIPAA verified with patient.  RN Health Coach introduced self and role.  Patient verbally agrees to monthly telephone outreaches.  Plan:  RN Health Coach will outreach within the next 10 business days to complete initial telephone assessment.  [place] [phone] [name]

## 2017-05-25 ENCOUNTER — Encounter: Payer: Self-pay | Admitting: *Deleted

## 2017-05-25 ENCOUNTER — Other Ambulatory Visit: Payer: Self-pay | Admitting: *Deleted

## 2017-05-25 NOTE — Patient Outreach (Signed)
[place] [facility]) Care Management  05/25/2017  JLYN BRACAMONTE 1931/04/04 [id number]   RN Health Coach Initial Assessment  Referral Date:  05/15/2017 Referral Source:  Transfer from [facility] TOC/Community Nurse Reason for Referral:  Continued Disease Management Education Insurance:  [employer]   Outreach Attempt:  Later telephone outreach attempted per patient request.  No answer.  RN Health Coach left HIPAA compliant voice message with return contact information.  Plan:  RN Health Coach will make another outreach attempt to patient within one business day if no return call back from patient.  [place] [phone] [email or web address]

## 2017-05-25 NOTE — Patient Outreach (Signed)
[place] [facility]) Care Management  05/25/2017  Brent [name] Sep 02, 1931 [id number]   RN Health Coach Initial Assessment  Referral Date:  05/15/2017 Referral Source:  Transfer from [facility] TOC/Community Nurse Reason for Referral:  Continued Disease Management Education Insurance:  [employer]   Outreach Attempt:  Outreach attempt #1 to patient for initial telephone assessment.  Patient answered and stated he was driving and requesting call back in about 30 minutes.  Plan:  RN Health Coach will attempt another outreach to complete initial telephone assessment later today per patient request.   Brent Azure RN [place] [phone] [name]

## 2017-05-25 NOTE — Patient Outreach (Signed)
[facility]) Care Management  [place]  05/25/2017   BLANCHARD WILLHITE [birth year] [id number]   RN Health Coach Initial Assessment   Referral Date:  05/15/2017 Referral Source:  Transfer from [facility] TOC/Community Nurse Reason for Referral:  Continued Disease Management Education Insurance:  Health Team Advantage   Outreach Attempt:  Successful telephone outreach to patient for initial telephone assessment.  HIPAA verified with patient.  Patient completed initial telephone assessment.  Social:  Patient stating he currently is living in between his home with his wife, and most of the time for the past 3 months he has been with his daughter, Butch Penny in [place].  States his wife is the primary care giver for her mother.  Patient reports he is independent with his ADLs and IADLs.  His daughter had been transporting him to his medical appointments for the last 3 months and he will transport himself to his Urology appointment tomorrow.  DME in the home include:  BP cuff, scale, standard walker, straight cane, and reading glasses.  Patient reports 2 falls without injury, one last summer and one last fall tripping over a carpet in the home.  Falls precautions reviewed and discussed with patient.  Conditions:   Per chart review and discussion with patient, PMH include:  COPD, congestive heart failure, GERD, hypertension, AAA, back pain, coronary artery disease, coronary artery bypass grafting, dizziness, pulmonary emboli, hyperlipidemia, and myocardial infarction.  Patient reports having recent urology surgery on 05/12/2017 of circumcision with removal of hydrocele.  Scheduled follow up appointment with Urology is tomorrow morning.  Reports monitoring his blood pressure daily.  BP this morning 139/50.  Weighs himself every other day.  Last weight was 234 pounds.  States over the last 3 months he has lost about 18 pounds, intentional with diet and exercise.  Patient reports getting some  shortness of breath with activity but it typically resolves with rest.  Reports only having to use his rescue inhaler about 2-3 times in the past month. Denies any current shortness of breath or coughing.  Medications:  Patient reports taking about 14 medications.  Denies any difficulties affording medications.  States his daughter manages his medications by filling a weekly pill box.  Denies having any questions concerning his medications.  Encounter Medications:  Outpatient Encounter Medications as of 05/25/2017  Medication Sig Note  . albuterol (PROAIR HFA) 108 (90 Base) MCG/ACT inhaler Inhale 2 puffs into the lungs every 6 (six) hours as needed for wheezing or shortness of breath.   . Ascorbic Acid (VITAMIN C) 1000 MG tablet Take 1,000 mg by mouth daily.   Marland Kitchen aspirin EC 81 MG tablet Take 81 mg by mouth at bedtime.    . budesonide-formoterol (SYMBICORT) 80-4.5 MCG/ACT inhaler Inhale 2 puffs into the lungs 2 (two) times daily.   . carvedilol (COREG) 3.125 MG tablet Take 1 tablet (3.125 mg total) by mouth 2 (two) times daily.   . cetirizine (ZYRTEC) 10 MG tablet Take 10 mg by mouth at bedtime.   . DULoxetine (CYMBALTA) 60 MG capsule Take 60 mg by mouth daily.   . furosemide (LASIX) 40 MG tablet TAKE 1 TABLET BY MOUTH 3 TIMES A WEEK   . guaiFENesin (MUCINEX) 600 MG 12 hr tablet Take 600 mg by mouth 2 (two) times daily.   . Multiple Vitamins-Minerals (MENS 50+ ADVANCED PO) Take 1 tablet by mouth daily.   . nitroGLYCERIN (NITROSTAT) 0.4 MG SL tablet PLACE 1 TABLET UNDER THE TONGUE EVERY 5 MINUTES AS  NEEDED FOR CHEST PAIN UP TO 3 DOSES, IF SYMPTOMS PERSIST CALL 911   . pantoprazole (PROTONIX) 40 MG tablet Take 40 mg by mouth 2 (two) times daily before a meal.   . potassium chloride (K-DUR) 10 MEQ tablet TAKE 1 TABLET BY MOUTH 3 TIMES PER WEEK,MONDAY, WEDNESDAY, AND FRIDAY   . predniSONE (DELTASONE) 10 MG tablet Take one daily - take two if breathing worse, both at breakfast 04/14/2017: Client states  takes one tablet daily.  . rosuvastatin (CRESTOR) 40 MG tablet Take 1 tablet (40 mg total) by mouth daily.   . diphenhydrAMINE (ALLERGY) 25 MG tablet Take 25 mg by mouth as needed. 05/13/2017: Not taking per 3.18.19 med calendar  . meclizine (ANTIVERT) 25 MG tablet Take 25 mg by mouth as needed. 05/13/2017: Not taking per 3.18.19 med calendar   No facility-administered encounter medications on file as of 05/25/2017.     Functional Status:  In your present state of health, do you have any difficulty performing the following activities: 05/25/2017 04/14/2017  Hearing? N Y  Vision? N N  Difficulty concentrating or making decisions? N N  Walking or climbing stairs? N N  Dressing or bathing? N N  Doing errands, shopping? N N  Preparing Food and eating ? N N  Using the Toilet? N N  In the past six months, have you accidently leaked urine? N N  Do you have problems with loss of bowel control? N N  Managing your Medications? N Y  Comment - daughter helps client manage his medications.  Managing your Finances? N -  Housekeeping or managing your Housekeeping? N N  Some recent data might be hidden    Fall/Depression Screening: Fall Risk  05/25/2017 04/14/2017  Falls in the past year? Yes Yes  Comment - about 4 months ago.  Number falls in past yr: 2 or more 2 or more  Comment 2-tripped and fell over the carpet last summer and fall tripped on rugs.  Injury with Fall? No Yes  Comment - skin tears  Risk Factor Category  High Fall Risk -  Risk for fall due to : History of fall(s) History of fall(s)  Follow up Education provided;Falls evaluation completed;Falls prevention discussed -   PHQ 2/9 Scores 05/25/2017 04/14/2017  PHQ - 2 Score 1 0    THN CM Care Plan Problem One     Most Recent Value  Care Plan Problem One  Knowledge deficiet related to self care management of COPD  Role Documenting the Problem One  Health [place] for Problem One  Not Active  [facility] Long Term Goal   Patient will  report no Hospitalizations in the next 90 days  THN Long Term Goal Start Date  05/25/17  Interventions for Problem One Long Term Goal  Current care plan reviewed and discussed with patient, patient encouraged to contact primary care provider to schedule follow up appointment, patient encouraged to keep and attend scheduled medical appointments, COPD action plan discussed with patient, Falls precautions and preventions discussed with patient  THN CM Short Term Goal #1   Patient will report loss of 2 pounds in the next month.  THN CM Short Term Goal #1 Start Date  05/25/17  Interventions for Short Term Goal #1  , discussed goal weight with patient  THN CM Short Term Goal #2   Patient will review COPD Action Plan Zones in the next 30 days.  THN CM Short Term Goal #2 Start Date  05/25/17  Interventions for Short Term Goal #2  COPD Action plan packet with zones mailed to patient, COPD action plan reviewed with patient, dicussed and encouraged medication compliance, encouraged patient to continue breathing exercises at least       Appointments:  Patient reports seeing his primary care provider, Dr. Alyson Ingles on 2/25/219.  States he needs to call to schedule follow up appointment.  Patient encouraged to do so.  Verbalizes he has scheduled appointment with Dr. Rosana Hoes, urologist on 05/26/2017.  Follow up appointment with Dr. [name], Pulmonologist June 2019.  Reports seeing Dr. Acie Fredrickson with Cardiology on 03/11/2017.  Advanced Directives:  Patient reports having [place] in place.  Does not wish to make any changes at this time.   Consent:  [place] reviewed and discussed with patient.  Patient verbally agrees to monthly telephone outreaches.  Plan: RN Health Coach will send patient Living Well with COPD packet and Action Plan Magnet. RN Health Coach will send patient 2019 Calendar Booklet. RN Health Coach will send primary MD Barrier Letter. RN Health Coach will route primary MD initial  assessment note. RN Health Coach will make next monthly outreach to patient in the month of May.  [place] [phone] [name]

## 2017-06-09 DIAGNOSIS — Z85820 Personal history of malignant melanoma of skin: Secondary | ICD-10-CM | POA: Diagnosis not present

## 2017-06-09 DIAGNOSIS — D485 Neoplasm of uncertain behavior of skin: Secondary | ICD-10-CM | POA: Diagnosis not present

## 2017-06-09 DIAGNOSIS — R202 Paresthesia of skin: Secondary | ICD-10-CM | POA: Diagnosis not present

## 2017-06-09 DIAGNOSIS — L989 Disorder of the skin and subcutaneous tissue, unspecified: Secondary | ICD-10-CM | POA: Diagnosis not present

## 2017-06-09 DIAGNOSIS — L570 Actinic keratosis: Secondary | ICD-10-CM | POA: Diagnosis not present

## 2017-06-09 DIAGNOSIS — L820 Inflamed seborrheic keratosis: Secondary | ICD-10-CM | POA: Diagnosis not present

## 2017-06-24 ENCOUNTER — Other Ambulatory Visit: Payer: Self-pay | Admitting: Cardiovascular Disease

## 2017-06-29 ENCOUNTER — Other Ambulatory Visit: Payer: Self-pay | Admitting: *Deleted

## 2017-06-29 NOTE — Patient Outreach (Signed)
[facility]) Care Management  06/29/2017  Brent [name] 03-20-1931 [id number]   [place] Monthly Outreach  Referral Date:  05/15/2017 Referral Source:  Transfer from [facility] TOC/Community Nurse Reason for Referral:  Continued Disease Management Education Insurance:  Health Team Advantage   Outreach Attempt:  Outreach attempt #1 to patient for monthly follow up. No answer. RN Health Coach left HIPAA compliant voicemail message along with contact information.  Plan:  RN Health Coach will send unsuccessful outreach letter to patient.  RN Health Coach will make another outreach attempt to patient within 3-4 business days if no return call back from patient.   [place] [phone] [email or web address]

## 2017-07-01 ENCOUNTER — Other Ambulatory Visit: Payer: Self-pay | Admitting: *Deleted

## 2017-07-01 ENCOUNTER — Telehealth: Payer: Self-pay | Admitting: Cardiovascular Disease

## 2017-07-01 ENCOUNTER — Encounter: Payer: Self-pay | Admitting: *Deleted

## 2017-07-01 NOTE — Patient Outreach (Signed)
[place] [facility]) Care Management  07/01/2017  Brent [name] 04/28/1931 [id number]   Brent [name]  Referral Date:  05/15/2017 Referral Source:  Transfer from [facility] TOC/Community Nurse Reason for Referral:  Continued Disease Management Education Insurance:  Health Team Advantage   [name] Attempt:  Successful telephone [name] for monthly follow up.  HIPAA verified with patient.  Patient reporting that he is having increased shortness of breath.  Reports he has used his rescue inhaler about 3 times in the last week, but "could of probably used it more".  States his shortness of breath is worse when he lies down in the bed.  Does also state he has been having more swelling in his legs in the last week.  Normally weighs himself daily, but is currently out of town and does not have a scale with him.  Feels as if he has put on a few pounds in the last week that he has been out of town with his daughter.  Patient normally takes his lasix as prescribed every other day on Monday, Wednesday, and Fridays.  States he took a extra dose yesterday of a half tablet (20mg ) and his swelling has went down some.  Patient encouraged to contact Dr. [name] office (Cardiologist) for recommendations or if he can be seen.  Also discussed with patient contacting Dr. [name] with increased shortness of breath.  Patient states he will contact Cardiologist today for plan of care.  Reports he has been having a pain in his left thigh for several months now that comes and goes.  Encouraged patient to discuss this pain with Dr. Alyson Ingles.  Appointments:  Patient reports he last saw Dr. Alyson Ingles on 04/20/2017 and has scheduled follow up appointment on 08/05/2017.  States follow up appointment with Dr. [name] is 08/11/2017.  Patient encouraged to contact Dr. [name] if shortness of breath does not get better after seeing Cardiologist.  Instructed and encouraged patient to schedule appointment with Dr. Acie Fredrickson  to be seen as soon as possible for orthopnea and increased lower extremity edema.  Plan: Patient to call Dr. [name] office for appointment or recommendations for possible heart failure exacerbation. RN Health Coach will make next monthly [name] in the month of June.   [name] [phone] [name]

## 2017-07-01 NOTE — Telephone Encounter (Signed)
Agree with plan to increase lasix until she can be seen

## 2017-07-01 NOTE — Telephone Encounter (Signed)
New Message   Pt c/o Shortness Of Breath: STAT if SOB developed within the last 24 hours or pt is noticeably SOB on the phone  1. Are you currently SOB (can you hear that pt is SOB on the phone)? He sounded short of breath every few sentences 2. How long have you been experiencing SOB? About a week   3. Are you SOB when sitting or when up moving around? Laying down and climbing stairs  4. Are you currently experiencing any other symptoms? Swelling    Pt c/o swelling: STAT is pt has developed SOB within 24 hours  1) How much weight have you gained and in what time span? Unknown   2) If swelling, where is the swelling located? Legs and ankles  3) Are you currently taking a fluid pill? Yes, lasix  4) Are you currently SOB? yes  5) Do you have a log of your daily weights (if so, list)? no  6) Have you gained 3 pounds in a day or 5 pounds in a week? unknown  7) Have you traveled recently? He is currently out of town

## 2017-07-01 NOTE — Patient Outreach (Signed)
[name]) Care Management  07/01/2017  Brent [name] 01-16-1932 [id number]   [place] Monthly Outreach  Referral Date:  05/15/2017 Referral Source:  Transfer from [facility] TOC/Community Nurse Reason for Referral:  Continued Disease Management Education Insurance:  Health Team Advantage   Outreach Attempt:  Outreach attempt #2 to patient for monthly follow up. No answer. RN Health Coach left HIPAA compliant voicemail message along with contact information.  Plan:  RN Health Coach will make another outreach attempt to patient within 3-4 business days if no return call back from patient.  [place] [phone] [name]

## 2017-07-01 NOTE — Telephone Encounter (Signed)
Spoke with patient regarding worsening SOB. He c/o right ankle swelling x 1 week, orthopnea which resolves after he lays down for a while. He has been prescribed Lasix 40 mg 3 days per week and asks if he can increase this to daily. States he has noticed an improvement in symptoms and urine output with lasix today. I advised him to take Lasix 40 mg daily and Kdur 10 mEq daily until he can come in for an appointment. He states he had improvement in SOB after having pneumonia and CHF exacerbation in January but for the last 2 weeks he has not been able to walk more than a few steps without difficulty. I offered him an appointment for tomorrow with APP and he declined, states he is in [place] until Sunday night. I scheduled him for Tuesday at 2 pm with Dr. Acie Fredrickson and advised him to call back or seek medical attention in [place] if symptoms worsen. He verbalized understanding and agreement and thanked me for the call.

## 2017-07-06 ENCOUNTER — Ambulatory Visit: Payer: PPO | Admitting: Internal Medicine

## 2017-07-06 ENCOUNTER — Encounter: Payer: Self-pay | Admitting: Internal Medicine

## 2017-07-06 ENCOUNTER — Other Ambulatory Visit: Payer: Self-pay | Admitting: Internal Medicine

## 2017-07-06 ENCOUNTER — Other Ambulatory Visit (INDEPENDENT_AMBULATORY_CARE_PROVIDER_SITE_OTHER): Payer: PPO

## 2017-07-06 ENCOUNTER — Ambulatory Visit (INDEPENDENT_AMBULATORY_CARE_PROVIDER_SITE_OTHER)
Admission: RE | Admit: 2017-07-06 | Discharge: 2017-07-06 | Disposition: A | Discharge status: Home / Self Care | Payer: PPO | Source: Ambulatory Visit | Attending: Internal Medicine | Admitting: Internal Medicine

## 2017-07-06 VITALS — BP 124/70 | HR 84 | Ht 73.0 in | Wt 233.6 lb

## 2017-07-06 DIAGNOSIS — T502X5A Adverse effect of carbonic-anhydrase inhibitors, benzothiadiazides and other diuretics, initial encounter: Secondary | ICD-10-CM

## 2017-07-06 DIAGNOSIS — R0609 Other forms of dyspnea: Secondary | ICD-10-CM

## 2017-07-06 DIAGNOSIS — J449 Chronic obstructive pulmonary disease, unspecified: Secondary | ICD-10-CM

## 2017-07-06 DIAGNOSIS — J8410 Pulmonary fibrosis, unspecified: Secondary | ICD-10-CM

## 2017-07-06 DIAGNOSIS — E876 Hypokalemia: Secondary | ICD-10-CM

## 2017-07-06 LAB — SEDIMENTATION RATE: Sed Rate: 39 mm/hr — ABNORMAL HIGH (ref 0–20)

## 2017-07-06 LAB — BASIC METABOLIC PANEL
BUN: 27 mg/dL — ABNORMAL HIGH (ref 6–23)
CALCIUM: 9.3 mg/dL (ref 8.4–10.5)
CO2: 34 mEq/L — ABNORMAL HIGH (ref 19–32)
Chloride: 100 mEq/L (ref 96–112)
Creatinine, Ser: 1.4 mg/dL (ref 0.40–1.50)
GFR: 51.36 mL/min — ABNORMAL LOW (ref >60.00)
GLUCOSE: 109 mg/dL — AB (ref 70–99)
POTASSIUM: 3.3 meq/L — AB (ref 3.5–5.1)
SODIUM: 143 meq/L (ref 135–145)

## 2017-07-06 LAB — CBC WITH DIFFERENTIAL/PLATELET
BASOS ABS: 0.1 10*3/uL (ref 0.0–0.1)
BASOS PCT: 0.9 % (ref 0.0–3.0)
EOS ABS: 0.3 10*3/uL (ref 0.0–0.7)
Eosinophils Relative: 3.2 % (ref 0.0–5.0)
HEMATOCRIT: 43.3 % (ref 39.0–52.0)
Hemoglobin: 14.3 g/dL (ref 13.0–17.0)
LYMPHS ABS: 2.2 10*3/uL (ref 0.7–4.0)
Lymphocytes Relative: 23.3 % (ref 12.0–46.0)
MCHC: 32.9 g/dL (ref 30.0–36.0)
MCV: 92.7 fl (ref 78.0–100.0)
MONO ABS: 0.7 10*3/uL (ref 0.1–1.0)
Monocytes Relative: 7.9 % (ref 3.0–12.0)
NEUTROS ABS: 6 10*3/uL (ref 1.4–7.7)
NEUTROS PCT: 64.7 % (ref 43.0–77.0)
PLATELETS: 291 10*3/uL (ref 150.0–400.0)
RBC: 4.67 Mil/uL (ref 4.22–5.81)
RDW: 15.2 % (ref 11.5–15.5)
WBC: 9.3 10*3/uL (ref 4.0–10.5)

## 2017-07-06 LAB — TSH: TSH: 2.35 u[IU]/mL (ref 0.35–4.50)

## 2017-07-06 LAB — BRAIN NATRIURETIC PEPTIDE: Pro B Natriuretic peptide (BNP): 121 pg/mL — ABNORMAL HIGH (ref 0.0–100.0)

## 2017-07-06 NOTE — Progress Notes (Signed)
Subjective:    Patient ID: Brent [name], male    DOB: [birth year],    MRN: [id number]    Brief patient profile:  89  yowm quit smoking in 1978 with cabg late 1990's  No trouble breathing until around summer 2015 then cough onset x 05/2014 with neg cardiac w/u by nishan> referred 09/11/2014  by [name] with PF on CT 08/23/14 and GOLD III critieria for copd       History of Present Illness  09/11/2014 1st [facility] office visit/ [facility]   Chief Complaint  Patient presents with  . Pulmonary Consult    Referred by [name]. Pt c/o cough, wheezing and SOB for the past 2 months. He states he gets SOB with walking 100 yrds. His cough is mainly non prod but will occ produce some clear sputum.   dyspnea first symptom he noted  x one year just with exertion indolent onset / minimally progressive then coughing x 2-3 months assoc with hb  And since onset of cough having more sob.   Cough not better with saba/ some better p pred/breo rec Try prilosec otc 20mg  x 2  Take 30-60 min before first meal of the day and Pepcid ac (famotidine) 20 mg one @  bedtime until return (over the counter) GERD diet     01/05/2017  f/u ov/Wert re:  GOLD III copd/ PF  Chief Complaint  Patient presents with  . Follow-up    Breathing is overall doing well. He is coughing some with white sputum. He states he was txed for bronchitis by his PCP recently.   already finished zpak, / prednisone  Improved/ chronically not taking ppi consistently  Doe back to [facility] =1  rec Plan A = Automatic =   Symbicort  160 Take 2 puffs first thing in am and then another 2 puffs about 12 hours later.  Work on inhaler technique:   Plan B = Backup Only use your albuterol as a rescue medication     NP  05/11/17 Continue on current regimen Follow med calendar closely and bring to each visit      07/06/2017  f/u ov/Wert re:  Copd III/ pf - no med calendar on pred 10 mg daily  Chief Complaint  Patient presents with  .  Acute Visit    Increased SOB for the past 2 wks. He states that he gets SOB when he lies down and with exertion such as walking short distances and up stairs.   Dyspnea:  More sob esp steps x 2 weeks, indolent onset  Cough: none Sleep: L side down / horizontal otherwise sob supine  SABA use:  Has not tried saba  inhalers to see if they help   No obvious day to day or daytime variability or assoc excess/ purulent sputum or mucus plugs or hemoptysis or cp or chest tightness, subjective wheeze or overt sinus or hb symptoms. No unusual exposure hx or h/o childhood pna/ asthma or knowledge of premature birth.  Sleeping  Ok horizontal/ L side down  without nocturnal  or early am exacerbation  of respiratory  c/o's or need for noct saba. Also denies any obvious fluctuation of symptoms with weather or environmental changes or other aggravating or alleviating factors except as outlined above   Current Allergies, Complete Past Medical History, Past Surgical History, Family History, and Social History were reviewed in [employer] record.  ROS  The following are not active complaints unless bolded  Hoarseness, sore throat, dysphagia, dental problems, itching, sneezing,  nasal congestion or discharge of excess mucus or purulent secretions, ear ache,   fever, chills, sweats, unintended wt loss or wt gain, classically pleuritic or exertional cp,  orthopnea pnd or arm/hand swelling  or leg swelling, presyncope, palpitations, abdominal pain, anorexia, nausea, vomiting, diarrhea  or change in bowel habits or change in bladder habits, change in stools or change in urine, dysuria, hematuria,  rash, arthralgias, visual complaints, headache, numbness, weakness or ataxia or problems with walking or coordination,  change in mood or  memory.        Current Meds  Medication Sig  . albuterol (PROAIR HFA) 108 (90 Base) MCG/ACT inhaler Inhale 2 puffs into the lungs every 6 (six) hours as needed for  wheezing or shortness of breath.  . Ascorbic Acid (VITAMIN C) 1000 MG tablet Take 1,000 mg by mouth daily.  Marland Kitchen aspirin EC 81 MG tablet Take 81 mg by mouth at bedtime.   . budesonide-formoterol (SYMBICORT) 80-4.5 MCG/ACT inhaler Inhale 2 puffs into the lungs 2 (two) times daily.  . carvedilol (COREG) 3.125 MG tablet Take 1 tablet (3.125 mg total) by mouth 2 (two) times daily.  . cetirizine (ZYRTEC) 10 MG tablet Take 10 mg by mouth at bedtime.  . diphenhydrAMINE (ALLERGY) 25 MG tablet Take 25 mg by mouth as needed.  . DULoxetine (CYMBALTA) 60 MG capsule Take 60 mg by mouth daily.  . meclizine (ANTIVERT) 25 MG tablet Take 25 mg by mouth as needed.  . Multiple Vitamins-Minerals (MENS 50+ ADVANCED PO) Take 1 tablet by mouth daily.  . nitroGLYCERIN (NITROSTAT) 0.4 MG SL tablet PLACE 1 TABLET UNDER THE TONGUE EVERY 5 MINUTES AS NEEDED FOR CHEST PAIN UP TO 3 DOSES, IF SYMPTOMS PERSIST CALL 911  . pantoprazole (PROTONIX) 40 MG tablet Take 40 mg by mouth 2 (two) times daily before a meal.  . predniSONE (DELTASONE) 10 MG tablet Take one daily - take two if breathing worse, both at breakfast  . rosuvastatin (CRESTOR) 40 MG tablet TAKE 1 TABLET BY MOUTH EVERY DAY  . [  furosemide (LASIX) 40 MG tablet TAKE 1 TABLET BY MOUTH 3 TIMES A WEEK  . [  guaiFENesin (MUCINEX) 600 MG 12 hr tablet Take 600 mg by mouth 2 (two) times daily.  . [  potassium chloride (K-DUR) 10 MEQ tablet TAKE 1 TABLET BY MOUTH 3 TIMES PER WEEK,MONDAY, WEDNESDAY, AND FRIDAY                                 Objective:   Physical Exam  Amb mod hoarse wm nad  Vital signs reviewed - Note on arrival 02 sats  94% on RA     10/23/2014       250 >248 11/14/2014 > 12/04/2014    248  > 04/23/2015   261 > 07/24/2015  258 > 09/29/2016   249 > 01/05/2017  240 > 01/05/2017   240 > 04/13/2017   234 > 07/06/2017  233     HEENT: nl dentition, turbinates bilaterally, and oropharynx. Nl external ear canals without cough reflex   NECK :   without JVD/Nodes/TM/ nl carotid upstrokes bilaterally   LUNGS: no acc muscle use,  Nl contour chest  Inspiratory crackles in bases bilaterally with min exp rhonchi  cough on insp or exp maneuvers   CV:  RRR  no s3 or murmur or increase in P2, and  no edema   ABD:  soft and nontender with nl inspiratory excursion in the supine position. No bruits or organomegaly appreciated, bowel sounds nl  MS:  Nl gait/ ext warm without deformities, calf tenderness, cyanosis or clubbing- yellow nailbeds noted (longstanding) No obvious joint restrictions   SKIN: warm and dry without lesions    NEURO:  alert, approp, nl sensorium with  no motor or cerebellar deficits apparent.          CXR PA and Lateral:   07/06/2017 :    I personally reviewed images and agree with radiology impression as follows:   No acute pneumonia nor CHF. Chronic fibrotic changes bilaterally which appears slightly more conspicuous today. Previous CABG.    Labs ordered/ reviewed:      Chemistry      Component Value Date/Time   NA 143 07/06/2017 1157   NA 138 02/25/2017 1140   K 3.3 (L) 07/06/2017 1157   CL 100 07/06/2017 1157   CO2 34 (H) 07/06/2017 1157   BUN 27 (H) 07/06/2017 1157   BUN 22 02/25/2017 1140   CREATININE 1.40 07/06/2017 1157   CREATININE 1.54 (H) 12-23-2015 1553      Component Value Date/Time   CALCIUM 9.3 07/06/2017 1157   ALKPHOS 89 02/25/2017 1140   AST 19 02/25/2017 1140   ALT 10 02/25/2017 1140   BILITOT 0.8 02/25/2017 1140        Lab Results  Component Value Date   WBC 9.3 07/06/2017   HGB 14.3 07/06/2017   HCT 43.3 07/06/2017   MCV 92.7 07/06/2017   PLT 291.0 07/06/2017         Lab Results  Component Value Date   TSH 2.35 07/06/2017     Lab Results  Component Value Date   PROBNP 121.0 (H) 07/06/2017       Lab Results  Component Value Date   ESRSEDRATE 39 (H) 07/06/2017   ESRSEDRATE 61 (H) 04/13/2017   ESRSEDRATE 47 (H) 11/14/2014                Assessment  & Plan:

## 2017-07-06 NOTE — Patient Instructions (Addendum)
See calendar for specific medication instructions and bring it back for each and every office visit for every healthcare provider you see.  Without it,  you may not receive the best quality medical care that we feel you deserve.  You will note that the calendar groups together  your maintenance  medications that are timed at particular times of the day.  Think of this as your checklist for what your doctor has instructed you to do until your next evaluation to see what benefit  there is  to staying on a consistent group of medications intended to keep you well.  The other group at the bottom is entirely up to you to use as you see fit  for specific symptoms that may arise between visits that require you to treat them on an as needed basis.  Think of this as your action plan or "what if" list.   Separating the top medications from the bottom group is fundamental to providing you adequate care going forward.     Please remember to go to the lab and x-ray department downstairs in the basement  for your tests - we will call you with the results when they are available.     Please schedule a follow up office visit in 6 weeks, call sooner if needed with all medications /inhalers/ solutions in hand so we can verify exactly what you are taking. This includes all medications from all doctors and over the [place] separate them into two bags:  the ones you take automatically, no matter what, vs the ones you take just when you feel you need them "BAG #2 is UP TO YOU"  - this will really help [place] help you take your medications more effectively.  Add HRCT needed

## 2017-07-07 ENCOUNTER — Encounter: Payer: Self-pay | Admitting: Cardiovascular Disease

## 2017-07-07 ENCOUNTER — Ambulatory Visit: Payer: PPO | Admitting: Cardiovascular Disease

## 2017-07-07 VITALS — BP 120/82 | HR 72 | Ht 73.0 in | Wt 233.0 lb

## 2017-07-07 DIAGNOSIS — E782 Mixed hyperlipidemia: Secondary | ICD-10-CM | POA: Diagnosis not present

## 2017-07-07 DIAGNOSIS — I2510 Atherosclerotic heart disease of native coronary artery without angina pectoris: Secondary | ICD-10-CM

## 2017-07-07 DIAGNOSIS — I739 Peripheral vascular disease, unspecified: Secondary | ICD-10-CM | POA: Diagnosis not present

## 2017-07-07 DIAGNOSIS — I5032 Chronic diastolic (congestive) heart failure: Secondary | ICD-10-CM

## 2017-07-07 MED ORDER — POTASSIUM CHLORIDE ER 10 MEQ PO TBCR: 10.0000 meq | EXTENDED_RELEASE_TABLET | Freq: Every day | ORAL | 3 refills | Status: AC

## 2017-07-07 MED ORDER — FUROSEMIDE 40 MG PO TABS: 40.0000 mg | ORAL_TABLET | Freq: Every day | ORAL | 3 refills | Status: AC

## 2017-07-07 NOTE — Progress Notes (Signed)
Spoke with pt and notified of results per Dr. [name]. Pt verbalized understanding and denied any questions. Copy to Dr Acie Fredrickson

## 2017-07-07 NOTE — Patient Instructions (Addendum)
Medication Instructions:  Your physician has recommended you make the following change in your medication:   TAKE Furosemide (Lasix) 40 mg once daily TAKE Kdur (potassium)    Labwork: Your physician recommends that you return for lab work in: 6 months on the day of or a few days before your office visit  You will need to FAST for this appointment - nothing to eat or drink after midnight the night before except water.   Testing/Procedures: Your physician has requested that you have a lower extremity arterial duplex. This test is an ultrasound of the arteries in the legs or arms. It looks at arterial blood flow in the legs and arms. Allow one hour for Lower and Upper Arterial scans. There are no restrictions or special instructions    Follow-Up: Your physician wants you to follow-up in: 6 months with Nurse Practitioner or PA on Dr. [name] team. You will receive a reminder letter in the mail two months in advance. If you don't receive a letter, please call our office to schedule the follow-up appointment.   If you need a refill on your cardiac medications before your next appointment, please call your pharmacy.   Thank you for choosing [facility]! Christen Bame, RN [phone]

## 2017-07-07 NOTE — Addendum Note (Signed)
Addended by: Emmaline Life on: 07/07/2017 02:42 PM   Modules accepted: Orders

## 2017-07-07 NOTE — Progress Notes (Addendum)
Cardiology Office Note   Date:  07/07/2017   ID:  Brent [name], DOB 12-Dec-1931, MRN [id number]  PCP:  Maury Dus, MD  Cardiologist:   Mertie Moores, MD   Chief Complaint  Patient presents with  . Coronary Artery Disease   1. CAD / CABG - 2000 2. HTN 3. Hyperlipidemia  History of Present Illness:  Brent [name] is 84 y.o. gentleman with a hx of CAD/CABG, hyperlipidemia, HTN. He has not had any chest pain. He has been having lots of left hip pain. He had a steroid injection which helped quite a bit but it appears that the injection has now worn off.  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  Feb. 24, 2014:  He has been having some dyspnea with exercise. He has lots of orthopedic problems. He is exercising some some but not as much as he would like.  Nov. 6 ,2014:  Brent [name] is doing well. He recently had a laser treatment for an esophageal issue. No cardiac issues. Feeling well. Still working on a [name] . He has lost 10 lbs.   September 21, 2013:  Brent [name] is doing well. Has had a right shoulder replacement.  Feb. 17, 2016:  Brent [name] is a 84 y.o. male who presents for follow up of his dyspnea. He has been getting more short of breath over the past several weeks.  Was having DOE climbing up an incline.  He was seen by Nicki Reaper in clinic. He was started on Lasix and feels much better. He has decreased the lasix to twice a week. .  Follow-up Myoview was low risk .Overall Impression: Low risk stress nuclear study With no ischemia identified. Possible apical infarct pattern. Decreased sensitivity secondary to bowel loop attenuation..  LV Ejection Fraction: 51%. LV Wall Motion: Septal wall hypokinesis with otherwise normal LV function  Not having any chest pain   Jul 14, 2014:  Brent [name] is a 84 y.o. male who presents for follow up of his CAD  Having some bronchitis.  No CP . Still working on his antique car. Not exercising much   Jul 16, 2015  Doing well. No cardiac problems  Chronic back problems. Has COPD   Oct. 5, 2017:  Has been having low energy.   BP was low.  Amlodipine has been held  BP is normal today   June 11, 2016:  BP has been good.   Jan. 16  2019      Doing well from a heart [place] Has become weaker  Has fallen ,  Had pneumonia last week .   Has been losing weight   Jul 07, 2017:   Brent [name] is seen back today for follow-up visit. Blood pressure is very elevated today-168/94.  BP was normal at Dr. Gustavus Bryant office yesterday  Is on prednisone 10 mg a day   He is been having more problems with shortness of breath.  He is having lots of wheezing.  He has trace ankle edema. His symptoms and his exam is really more consistent with pulmonary issues/COPD.  I do not think that he has significant heart failure.  Echocardiogram from 2018 reveals normal left ventricular systolic function with grade 1 diastolic dysfunction.  He saw our posterior about peripheral arterial disease.  He is concerned because he is having some leg discomfort.  Past Medical History:  Diagnosis Date  . AAA (abdominal aortic aneurysm) ([place])   . Arthritis   . Back pain   .  CAD (coronary artery disease)    Status post CABG  . COPD (chronic obstructive pulmonary disease) ([place])   . Dizziness    takes Antivert daily  . Erectile dysfunction   . GERD (gastroesophageal reflux disease)    takes Omeprazole daily  . History of blood clots    to the lungs   . HTN (hypertension)    takes Amlodipine and Micardis daily  . Hx of cardiovascular stress test    Lexiscan Myoview (2/16):  Possible apical infarct, no ischemia, EF 51%; Low Risk  . Hyperlipidemia    takes Crestor daily  . Myocardial infarction ([place])   . Pulmonary embolism [facility])     Past Surgical History:  Procedure Laterality Date  . Broken Ankle    . Broken Jaw     20+ years ago  . CARDIOVASCULAR STRESS TEST  04-28-2005   EF 53%  . CORONARY ARTERY BYPASS GRAFT   1998   there are sequential 90% stenosis in the proximal LAD, and the mid and distal LAD is a moderate sized vessel. The mid and first diagonal branch has a 50-60% stenosis.   Marland Kitchen ESOPHAGOSCOPY  06/25/2011   Procedure: ESOPHAGOSCOPY;  Surgeon: Izora Gala, MD;  Location: [place];  Service: ENT;  Laterality: N/A;  Direct Esophagoscopy  . TONSILLECTOMY    . TOTAL SHOULDER ARTHROPLASTY Right 06/02/2013   Procedure: RIGHT TOTAL SHOULDER ARTHROPLASTY;  Surgeon: Marin Shutter, MD;  Location: [place];  Service: Orthopedics;  Laterality: Right;     Current Outpatient Medications  Medication Sig Dispense Refill  . albuterol (PROAIR HFA) 108 (90 Base) MCG/ACT inhaler Inhale 2 puffs into the lungs every 6 (six) hours as needed for wheezing or shortness of breath. 1 Inhaler 5  . Ascorbic Acid (VITAMIN C) 1000 MG tablet Take 1,000 mg by mouth daily.    Marland Kitchen aspirin EC 81 MG tablet Take 81 mg by mouth at bedtime.     . budesonide-formoterol (SYMBICORT) 80-4.5 MCG/ACT inhaler Inhale 2 puffs into the lungs 2 (two) times daily. 1 Inhaler 11  . carvedilol (COREG) 3.125 MG tablet Take 1 tablet (3.125 mg total) by mouth 2 (two) times daily. 180 tablet 2  . cetirizine (ZYRTEC) 10 MG tablet Take 10 mg by mouth at bedtime.    . diphenhydrAMINE (ALLERGY) 25 MG tablet Take 25 mg by mouth as needed.    . DULoxetine (CYMBALTA) 60 MG capsule Take 60 mg by mouth daily.    . furosemide (LASIX) 40 MG tablet TAKE 1 TABLET BY MOUTH 3 TIMES A WEEK 36 tablet 3  . meclizine (ANTIVERT) 25 MG tablet Take 25 mg by mouth as needed.    . Multiple Vitamins-Minerals (MENS 50+ ADVANCED PO) Take 1 tablet by mouth daily.    . nitroGLYCERIN (NITROSTAT) 0.4 MG SL tablet PLACE 1 TABLET UNDER THE TONGUE EVERY 5 MINUTES AS NEEDED FOR CHEST PAIN UP TO 3 DOSES, IF SYMPTOMS PERSIST CALL 911 25 tablet 3  . pantoprazole (PROTONIX) 40 MG tablet Take 40 mg by mouth 2 (two) times daily before a meal.    . potassium chloride (K-DUR) 10 MEQ tablet TAKE 1 TABLET  BY MOUTH 3 TIMES PER WEEK,MONDAY, WEDNESDAY, AND FRIDAY 36 tablet 3  . predniSONE (DELTASONE) 10 MG tablet Take one daily - take two if breathing worse, both at breakfast 100 tablet 0  . rosuvastatin (CRESTOR) 40 MG tablet TAKE 1 TABLET BY MOUTH EVERY DAY 30 tablet 7   No current facility-administered medications for this visit.  Allergies:   Morphine and Morphine and related    Social History:  The patient  reports that he quit smoking about 41 years ago. His smoking use included cigarettes. He has a 20.00 pack-year smoking history. He has never used smokeless tobacco. He reports that he drinks about 0.6 oz of alcohol per week. He reports that he does not use drugs.   Family History:  The patient's family history includes Breast cancer in his mother; Heart attack in his mother; Stroke in his mother.    ROS: Noted in current history, all other systems are negative  Physical Exam: Blood pressure 120/82, pulse 72, height 6\' 1"  (1.854 m), weight 233 lb (105.7 kg), SpO2 97 %.  GEN:  Elderly male,   NAD  HEENT: Normal NECK: No JVD; No carotid bruits LYMPHATICS: No lymphadenopathy CARDIAC: RRR , no murmurs, rubs, gallops RESPIRATORY:    rew rales, rhonchi,  Scant wheezes.  ABDOMEN: Soft, non-tender, non-distended MUSCULOSKELETAL:  Trace - 1+ edema; distal foot pulses are very difficult to feel. SKIN: Warm and dry NEUROLOGIC:  Alert and oriented x 3   EKG:      Recent Labs: 02/25/2017: ALT 10 07/06/2017: BUN 27; Creatinine, Ser 1.40; Hemoglobin 14.3; Platelets 291.0; Potassium 3.3; Pro B Natriuretic peptide (BNP) 121.0; Sodium 143; TSH 2.35    Lipid Panel    Component Value Date/Time   CHOL 159 06/11/2016 1100   TRIG 177 (H) 06/11/2016 1100   HDL 37 (L) 06/11/2016 1100   CHOLHDL 4.3 06/11/2016 1100   CHOLHDL 3.9 June 14, 202017 1553   VLDL 37 (H) June 14, 202017 1553   LDLCALC 87 06/11/2016 1100      Wt Readings from Last 3 Encounters:  07/07/17 233 lb (105.7 kg)  07/06/17 233  lb 9.6 oz (106 kg)  05/11/17 231 lb 9.6 oz (105.1 kg)      Other studies Reviewed: Additional studies/ records that were reviewed today include: . Review of the above records demonstrates:    ASSESSMENT AND PLAN:  1. CAD / CABG - 2000 -not having any angina.  2. HTN-  BP is elevated today  - was normal at Dr. Gustavus Bryant office  3. Hyperlipidemia -    4.   HX of chronic diastolic CHF:   He is having some shortness of breath but this really seems to be more of a pulmonary issue.  He has only trace leg edema.  We will continue Lasix and potassium on a daily basis.    He has been on chronic prednisone therapy which tends to make him retain sodium and water.  5. COPD -seems to be his main issue.  Continue to see Dr. [name]   6.  Screening for peripheral vascular disease: Toriano has some symptoms.  It is unclear if these are due to claudication.  We will schedule him for screening ankle-brachial indexes.   Current medicines are reviewed at length with the patient today.  The patient does not have concerns regarding medicines.  The following changes have been made:  no change  Labs/ tests ordered today include:  No orders of the defined types were placed in this encounter.    Disposition:   FU with APP in 6 months ,  Will see me in 1 year.     Mertie Moores, MD  07/07/2017 2:15 PM    [facility] [place], [place], [place]  [address] Phone: [phone]; Fax: [phone]

## 2017-07-07 NOTE — Progress Notes (Signed)
Spoke with pt and notified of results per Dr. Wert. Pt verbalized understanding and denied any questions. 

## 2017-07-08 ENCOUNTER — Other Ambulatory Visit: Payer: Self-pay | Admitting: Cardiovascular Disease

## 2017-07-08 ENCOUNTER — Encounter: Payer: Self-pay | Admitting: Internal Medicine

## 2017-07-08 DIAGNOSIS — I739 Peripheral vascular disease, unspecified: Secondary | ICD-10-CM

## 2017-07-08 DIAGNOSIS — T502X5A Adverse effect of carbonic-anhydrase inhibitors, benzothiadiazides and other diuretics, initial encounter: Secondary | ICD-10-CM

## 2017-07-08 DIAGNOSIS — E876 Hypokalemia: Secondary | ICD-10-CM | POA: Insufficient documentation

## 2017-07-08 NOTE — Assessment & Plan Note (Signed)
I agree with cardiology that his problem appears to be more  pulmonary than cardiac at this point based on the BUN to sedimentation rate ratio(that is his BNP is minimally elevated but his  sedimentation rates about twice normal)) and will proceed with a high-resolution CT scan to sort out whether he truly has UIP or not.  Discussed in detail all the  indications, usual  risks and alternatives  relative to the benefits with patient who agrees to proceed with w/u as outlined.

## 2017-07-08 NOTE — Assessment & Plan Note (Signed)
-  HRCT 08/23/14  1. Pulmonary parenchymal pattern of fibrosis is likely due to usual interstitial pneumonitis (UIP). 2. Enlarged pulmonary arteries, indicative of pulmonary arterial Hypertension. -09/11/2014  Walked RA x 3 laps @ 185 ft each stopped due to End of study, nl pace, no sob or desat  - PFT's  12/04/2014  FEV1 1.39 (43 % ) ratio 64  p no % improvement from saba with DLCO  53 % corrects to 111 % for alv volume  And ERV 20 - 04/23/2015  Walked RA x 3 laps @ 185 ft each stopped due to  End of study, nl pace, no sob or desat   - 04/13/2017 improved on prednisone 04/13/2017 but ESR still 61 so rec try taper to pred 10 mg daily and hold there - 07/06/2017  ESR 39 on pred '10mg'$  daily   Does appear to be worse at this point/ next step is HRCT and consider OFEV  In meantime, The goal with a chronic steroid dependent illness is always arriving at the lowest effective dose that controls the disease/symptoms and not accepting a set "formula" which is based on statistics or guidelines that don't always take into account patient  variability or the natural hx of the dz in every individual patient, which may well vary over time.  For now therefore I recommend the patient maintain  10 mg daily

## 2017-07-08 NOTE — Assessment & Plan Note (Signed)
PFT's 12/04/2014 mostly restrictive but ratio 64% so quite mild relative to PF component  - 12/04/2014   75% so try symbicort 160 2bid  - 04/23/2015  extensive coaching HFA effectiveness =    90% from a baseline of 75%  - 07/24/2015 try off symbicort at pt's request > can take up to 2 pffs q 12h prn  - 01/05/2017  After extensive coaching HFA effectiveness =    75%  - PFT's  04/13/2017  FEV1 1.89 (60 % ) ratio 83  p 7 % improvement from saba p symb 160 2 prior to study with DLCO  39 % corrects to 81 % for alv volume   - 04/13/2017   symb 80 2bid and taper prednisone to 10 mg per day and hold there   07/06/2017  After extensive coaching inhaler device  effectiveness =    75%   See comments re minimizing prednisone above.. I really think is more restrictive than obstructive and therefore high-resolution CT scan as the next step..  I had an extended discussion with the patient reviewing all relevant studies completed to date and  lasting 15 to 20 minutes of a 25 minute visit    See device teaching which extended face to face time for this visit   Each maintenance medication was reviewed in detail including most importantly the difference between maintenance and prns and under what circumstances the prns are to be triggered using an action plan format that is not reflected in the computer generated alphabetically organized AVS but trather by a customized med calendar that reflects the AVS meds with confirmed 100% correlation.   In addition, Please see AVS for unique instructions that I personally wrote and verbalized to the the pt in detail and then reviewed with pt  by my nurse highlighting any  changes in therapy recommended at today's visit to their plan of care.

## 2017-07-08 NOTE — Assessment & Plan Note (Signed)
rec increase potassium to 20 mV with each Lasix dose. Follow-up will be per Dr. Cathie Olden as prev planned

## 2017-07-09 ENCOUNTER — Telehealth: Payer: Self-pay | Admitting: *Deleted

## 2017-07-09 DIAGNOSIS — J8410 Pulmonary fibrosis, unspecified: Secondary | ICD-10-CM

## 2017-07-09 NOTE — Telephone Encounter (Signed)
-----   Message from Tanda Rockers, MD sent at 07/08/2017 12:30 PM EDT ----- After reviewing all the records and cardiology's last note I recommend proceeding with a high-resolution CT scan of the chest for evaluation of dyspnea on exertion

## 2017-07-10 ENCOUNTER — Ambulatory Visit (HOSPITAL_COMMUNITY)
Admission: RE | Admit: 2017-07-10 | Discharge: 2017-07-10 | Disposition: A | Discharge status: Home / Self Care | Payer: PPO | Source: Ambulatory Visit | Attending: Cardiovascular Disease | Admitting: Cardiovascular Disease

## 2017-07-10 DIAGNOSIS — I739 Peripheral vascular disease, unspecified: Secondary | ICD-10-CM | POA: Diagnosis not present

## 2017-07-14 DIAGNOSIS — D0352 Melanoma in situ of breast (skin) (soft tissue): Secondary | ICD-10-CM | POA: Diagnosis not present

## 2017-07-14 DIAGNOSIS — L905 Scar conditions and fibrosis of skin: Secondary | ICD-10-CM | POA: Diagnosis not present

## 2017-07-22 ENCOUNTER — Ambulatory Visit (INDEPENDENT_AMBULATORY_CARE_PROVIDER_SITE_OTHER)
Admission: RE | Admit: 2017-07-22 | Discharge: 2017-07-22 | Disposition: A | Discharge status: Home / Self Care | Payer: PPO | Source: Ambulatory Visit | Attending: Internal Medicine | Admitting: Internal Medicine

## 2017-07-22 DIAGNOSIS — J8410 Pulmonary fibrosis, unspecified: Secondary | ICD-10-CM | POA: Diagnosis not present

## 2017-07-23 ENCOUNTER — Telehealth: Payer: Self-pay | Admitting: Internal Medicine

## 2017-07-23 NOTE — Progress Notes (Signed)
LMTCB

## 2017-07-23 NOTE — Progress Notes (Signed)
Pt returned call and is aware of results/recs per the 07/23/17 phone note Breathing is better, no need for increased prednisone dose No earlier appts, pt okay with keeping his 6/25 appt Pt not requesting second opinion at this time

## 2017-07-23 NOTE — Telephone Encounter (Signed)
Result Notes for CT Chest High Resolution  Notes recorded by Rosana Berger, CMA on 07/23/2017 at 9:27 AM EDT LMTCB ------  Notes recorded by Tanda Rockers, MD on 07/23/2017 at 4:44 AM EDT Call patient :  Study is c/w mild progression of his dz - if breathing not better ok to double dose of prednisone until next ov and we will consider a trial of ofev - ok to move up to next available after I return 3rd week in June 2019 or can see MR for second opinion if he desires    Called spoke with patient and discussed CT results/recs as stated by MW above  Patient stated that his breathing is [facility] better since his last ov w/ Dr [name] so feels no need to double his prednisone  Did check MW's schedule for an earlier appt than patient's already scheduled 6.25.19, but there are no openings.  Pt stated he is okay with this current appt date and will contact the office if his breathing worsens for a sooner appt  Patient stated he does not see a need for a second opinion at this time as he is happy with MW's plan of care thus far  Nothing further needed Will sign and route to MW to make him aware of pt's responses above

## 2017-07-27 ENCOUNTER — Telehealth: Payer: Self-pay | Admitting: Internal Medicine

## 2017-07-27 MED ORDER — PREDNISONE 10 MG PO TABS: ORAL_TABLET | ORAL | 0 refills | Status: DC

## 2017-07-27 NOTE — Telephone Encounter (Signed)
Rx sent to his parmacy. ATC pt, no answer. Left message for pt to call back.   Assessment & Plan Note by Tanda Rockers, MD at 07/08/2017 12:23 PM  Author: Tanda Rockers, MD Author Type: Physician Filed: 07/08/2017 12:24 PM  Note Status: Written Cosign: Cosign Not Required Encounter Date: 07/06/2017  Problem: Postinflammatory pulmonary fibrosis ([place])  Editor: Tanda Rockers, MD (Physician)    - HRCT 08/23/14  1. Pulmonary parenchymal pattern of fibrosis is likely due to usual interstitial pneumonitis (UIP). 2. Enlarged pulmonary arteries, indicative of pulmonary arterial Hypertension. -09/11/2014  Walked RA x 3 laps @ 185 ft each stopped due to End of study, nl pace, no sob or desat  - PFT's  12/04/2014  FEV1 1.39 (43 % ) ratio 64  p no % improvement from saba with DLCO  53 % corrects to 111 % for alv volume  And ERV 20 - 04/23/2015  Walked RA x 3 laps @ 185 ft each stopped due to  End of study, nl pace, no sob or desat   - 04/13/2017 improved on prednisone 04/13/2017 but ESR still 61 so rec try taper to pred 10 mg daily and hold there - 07/06/2017  ESR 39 on pred 98m daily   Does appear to be worse at this point/ next step is HRCT and consider OFEV  In meantime, The goal with a chronic steroid dependent illness is always arriving at the lowest effective dose that controls the disease/symptoms and not accepting a set "formula" which is based on statistics or guidelines that don't always take into account patient  variability or the natural hx of the dz in every individual patient, which may well vary over time.  For now therefore I recommend the patient maintain  10 mg daily     Instructions   See calendar for specific medication instructions and bring it back for each and every office visit for every healthcare provider you see.  Without it,  you may not receive the best quality medical care that we feel you deserve.  You will note that the calendar groups together  your maintenance   medications that are timed at particular times of the day.  Think of this as your checklist for what your doctor has instructed you to do until your next evaluation to see what benefit  there is  to staying on a consistent group of medications intended to keep you well.  The other group at the bottom is entirely up to you to use as you see fit  for specific symptoms that may arise between visits that require you to treat them on an as needed basis.  Think of this as your action plan or "what if" list.   Separating the top medications from the bottom group is fundamental to providing you adequate care going forward.     Please remember to go to the lab and x-ray department downstairs in the basement  for your tests - we will call you with the results when they are available.     Please schedule a follow up office visit in 6 weeks, call sooner if needed with all medications /inhalers/ solutions in hand so we can verify exactly what you are taking. This includes all medications from all doctors and over the [place] them into two bags:  the ones you take automatically, no matter what, vs the ones you take just when you feel you need them "BAG #2 is UP TO  YOU"  - this will really help [place] help you take your medications more effectively.  Add HRCT needed        After Visit Summary (Printed 07/06/2017)

## 2017-07-28 NOTE — Telephone Encounter (Signed)
I spoke with pt about this already and clarified if he needed to continue at the current dose at 10mg . According to MW note he does need to stay on 10 mg until further notice.

## 2017-07-29 ENCOUNTER — Other Ambulatory Visit: Payer: Self-pay | Admitting: *Deleted

## 2017-07-29 NOTE — Patient Outreach (Signed)
[place] [facility]) Care Management  07/29/2017  KEVORK JOYCE Feb 16, 1932 [id number]   RN Health Coach Monthly Outreach  Referral Date:05/15/2017 Referral Source:Transfer from [facility] TOC/Community Nurse Reason for Referral:Continued Disease Management Education Insurance:[employer]   Outreach Attempt:  Outreach attempt #1 to patient for monthly follow up.  Patient stating he is driving at the current time and is requesting a call back.  Plan:   RN Health Coach will send unsuccessful outreach letter to patient.  RN Health Coach will make another outreach attempt to patient within 3-4 business days if no return call back from patient.  [place] Coach [phone] [email or web address]

## 2017-08-04 ENCOUNTER — Other Ambulatory Visit: Payer: Self-pay | Admitting: *Deleted

## 2017-08-04 NOTE — Patient Outreach (Signed)
[place] [facility]) Care Management  08/04/2017  Brent [name] 16-May-1931 [id number]   [place] Monthly Outreach  Referral Date:05/15/2017 Referral Source:Transfer from [facility] TOC/Community Nurse Reason for Referral:Continued Disease Management Education Insurance:[employer]   Outreach Attempt:  Outreach attempt #2 to patient for monthly follow up.   Initial call patient was not able to speak due to being in a crowd, requested call back in 10 minutes.  RN Health Coach attempted another telephone outreach.  No answer.  HIPAA compliant voice message left with return call back number.  Plan:  RN Health Coach will make another outreach attempt to patient within 3-4 business days if no return call back from patient.  [place] [phone] [name]

## 2017-08-11 ENCOUNTER — Other Ambulatory Visit: Payer: Self-pay | Admitting: *Deleted

## 2017-08-11 ENCOUNTER — Ambulatory Visit: Payer: PPO | Admitting: Internal Medicine

## 2017-08-11 NOTE — Patient Outreach (Addendum)
[place] [facility]) Care Management  08/11/2017  Brent [name] 03-23-1931 [id number]   RN Health Coach Case Closure  Referral Date:05/15/2017 Referral Source:Transfer from [facility] TOC/Community Nurse Reason for Referral:Continued Disease Management Education Insurance:[employer]   Outreach Attempt:  Outreach attempt #3 to patient for monthly follow up. No answer.   As [place] preparing to leave voice message, received incoming text to voice message from patient stating he "is unable to talk right now".  Plan:   RN Health Coach will close case today if no return call back from patient due to inability to maintain contact with patient.   RN Health Coach will send MD case closure letter. RN Health Coach will send patient case closure letter.  [place] [phone] [email or web address]

## 2017-08-18 ENCOUNTER — Encounter: Payer: Self-pay | Admitting: Internal Medicine

## 2017-08-18 ENCOUNTER — Ambulatory Visit: Payer: PPO | Admitting: Internal Medicine

## 2017-08-18 VITALS — BP 142/76 | HR 72 | Ht 73.0 in | Wt 236.0 lb

## 2017-08-18 DIAGNOSIS — J449 Chronic obstructive pulmonary disease, unspecified: Secondary | ICD-10-CM

## 2017-08-18 DIAGNOSIS — J8410 Pulmonary fibrosis, unspecified: Secondary | ICD-10-CM | POA: Diagnosis not present

## 2017-08-18 DIAGNOSIS — M545 Low back pain: Secondary | ICD-10-CM | POA: Diagnosis not present

## 2017-08-18 DIAGNOSIS — M5136 Other intervertebral disc degeneration, lumbar region: Secondary | ICD-10-CM | POA: Diagnosis not present

## 2017-08-18 DIAGNOSIS — M479 Spondylosis, unspecified: Secondary | ICD-10-CM | POA: Diagnosis not present

## 2017-08-18 MED ORDER — PREDNISONE 10 MG PO TABS: ORAL_TABLET | ORAL | 2 refills | Status: AC

## 2017-08-18 NOTE — Progress Notes (Signed)
Subjective:    Patient ID: Brent [name], male    DOB: 07/26/1931,    MRN: [id number]    Brief patient profile:  67  yowm quit smoking in 1978 with cabg late 1990's  No trouble breathing until around summer 2015 then cough onset x 05/2014 with neg cardiac w/u by nishan> referred 09/11/2014  by [name] with PF on CT 08/23/14 and GOLD III critieria for copd       History of Present Illness  09/11/2014 1st [facility] office visit/ Brent [name]   Chief Complaint  Patient presents with  . Pulmonary Consult    Referred by [name]. Pt c/o cough, wheezing and SOB for the past 2 months. He states he gets SOB with walking 100 yrds. His cough is mainly non prod but will occ produce some clear sputum.   dyspnea first symptom he noted  x one year just with exertion indolent onset / minimally progressive then coughing x 2-3 months assoc with hb  And since onset of cough having more sob.   Cough not better with saba/ some better p pred/breo rec Try prilosec otc 20mg  x 2  Take 30-60 min before first meal of the day and Pepcid ac (famotidine) 20 mg one @  bedtime until return (over the counter) GERD diet     01/05/2017  f/u ov/Brent [name] re:  GOLD III copd/ PF  Chief Complaint  Patient presents with  . Follow-up    Breathing is overall doing well. He is coughing some with white sputum. He states he was txed for bronchitis by his PCP recently.   already finished zpak, / prednisone  Improved/ chronically not taking ppi consistently  Doe back to [facility] =1  rec Plan A = Automatic =   Symbicort  160 Take 2 puffs first thing in am and then another 2 puffs about 12 hours later.  Work on inhaler technique:   Plan B = Backup Only use your albuterol as a rescue medication     NP  05/11/17 Continue on current regimen Follow med calendar closely and bring to each visit      07/06/2017  f/u ov/Brent [name] re:  Copd III/ pf - no med calendar on pred 10 mg daily  Chief Complaint  Patient presents with  .  Acute Visit    Increased SOB for the past 2 wks. He states that he gets SOB when he lies down and with exertion such as walking short distances and up stairs.   Dyspnea:  More sob esp steps x 2 weeks, indolent onset  Cough: none Sleep: L side down / horizontal otherwise sob supine  SABA use:  Has not tried saba  inhalers to see if they help rec See calendar for specific medication instructions and bring it back    08/18/2017  f/u ov/Brent [name] re:  COPD III/ pf no med cal on pred 10 mg bid (misunderstood rec for 2 q am)  - no med calendar  Chief Complaint  Patient presents with  . Follow-up    Breathing is "100% better". He has been taking pred 10 mg twice daily.  He has not had to use his albuterol inhaler.   Dyspnea:  Steps better / [employer] nl pace Cough: none   SABA use: none 02: none     No obvious day to day or daytime variability or assoc excess/ purulent sputum or mucus plugs or hemoptysis or cp or chest tightness, subjective wheeze or overt sinus or  hb symptoms.   L side done fine  without nocturnal  or early am exacerbation  of respiratory  c/o's or need for noct saba. Also denies any obvious fluctuation of symptoms with weather or environmental changes or other aggravating or alleviating factors except as outlined above   No unusual exposure hx or h/o childhood pna/ asthma or knowledge of premature birth.  Current Allergies, Complete Past Medical History, Past Surgical History, Family History, and Social History were reviewed in [employer] record.  ROS  The following are not active complaints unless bolded Hoarseness, sore throat, dysphagia, dental problems, itching, sneezing,  nasal congestion or discharge of excess mucus or purulent secretions, ear ache,   fever, chills, sweats, unintended wt loss or wt gain, classically pleuritic or exertional cp,  orthopnea pnd or arm/hand swelling  or leg swelling, presyncope, palpitations, abdominal pain, anorexia,  nausea, vomiting, diarrhea  or change in bowel habits or change in bladder habits, change in stools or change in urine, dysuria, hematuria,  rash, arthralgias, visual complaints, headache, numbness, weakness or ataxia or problems with walking or coordination,  change in mood or  memory.        Current Meds  Medication Sig  . albuterol (PROAIR HFA) 108 (90 Base) MCG/ACT inhaler Inhale 2 puffs into the lungs every 6 (six) hours as needed for wheezing or shortness of breath.  . Ascorbic Acid (VITAMIN C) 1000 MG tablet Take 1,000 mg by mouth daily.  Marland Kitchen aspirin EC 81 MG tablet Take 81 mg by mouth at bedtime.   . budesonide-formoterol (SYMBICORT) 80-4.5 MCG/ACT inhaler Inhale 2 puffs into the lungs 2 (two) times daily.  . carvedilol (COREG) 3.125 MG tablet Take 1 tablet (3.125 mg total) by mouth 2 (two) times daily.  . cetirizine (ZYRTEC) 10 MG tablet Take 10 mg by mouth at bedtime.  . diphenhydrAMINE (ALLERGY) 25 MG tablet Take 25 mg by mouth as needed.  . DULoxetine (CYMBALTA) 60 MG capsule Take 60 mg by mouth daily.  . furosemide (LASIX) 40 MG tablet Take 1 tablet (40 mg total) by mouth daily.  . meclizine (ANTIVERT) 25 MG tablet Take 25 mg by mouth as needed.  . Multiple Vitamins-Minerals (MENS 50+ ADVANCED PO) Take 1 tablet by mouth daily.  . nitroGLYCERIN (NITROSTAT) 0.4 MG SL tablet PLACE 1 TABLET UNDER THE TONGUE EVERY 5 MINUTES AS NEEDED FOR CHEST PAIN UP TO 3 DOSES, IF SYMPTOMS PERSIST CALL 911  . pantoprazole (PROTONIX) 40 MG tablet Take 40 mg by mouth 2 (two) times daily before a meal.  . potassium chloride (K-DUR) 10 MEQ tablet Take 1 tablet (10 mEq total) by mouth daily.  . predniSONE (DELTASONE) 10 MG tablet Take one daily - take two if breathing worse, both at breakfast  . rosuvastatin (CRESTOR) 40 MG tablet TAKE 1 TABLET BY MOUTH EVERY DAY  . [DISCONTINUED] predniSONE (DELTASONE) 10 MG tablet Take one daily - take two if breathing worse, both at breakfast                   Objective:   Physical Exam  Mod hoarse amb wm ? Mild cushingnoid   Vital signs reviewed - Note on arrival 02 sats  98% on RA   10/23/2014       250 >248 11/14/2014 > 12/04/2014    248  > 04/23/2015   261 > 07/24/2015  258 > 09/29/2016   249 > 01/05/2017  240 > 01/05/2017   240 > 04/13/2017  234 > 07/06/2017  233 > 08/18/2017  236     08/18/2017  LUNGS: no acc muscle use,  Nl contour chest  Inspiratory crackles in bases bilaterally with NO exp rhonchi     HEENT: nl dentition, turbinates bilaterally, and oropharynx. Nl external ear canals without cough reflex   NECK :  without JVD/Nodes/TM/ nl carotid upstrokes bilaterally   LUNGS: no acc muscle use,  Nl contour chest  With mild insp crackles bases / no exp rhonchi and no cough on insp/exp  CV:  RRR  no s3 or murmur or increase in P2, and no edema   ABD:  soft and nontender with nl inspiratory excursion in the supine position. No bruits or organomegaly appreciated, bowel sounds nl  MS:  Nl gait/ ext warm without deformities, calf tenderness, cyanosis or clubbing No obvious joint restrictions   SKIN: warm and dry without lesions  - nail beds yellow chronically   NEURO:  alert, approp, nl sensorium with  no motor or cerebellar deficits apparent.                I personally reviewed images and agree with radiology impression as follows:   Chest HRCT  07/22/17 1. Spectrum of findings compatible with fibrotic interstitial lung disease with mild-to-moderate honeycombing. Slight basilar predominance. Mild progression since 2016. Findings are considered diagnostic of usual interstitial pneumonia (UIP).          Assessment & Plan:

## 2017-08-18 NOTE — Patient Instructions (Addendum)
Prednisone 10 mg one on odd and two on even x 2 weeks then try try just one daily an if get worse start back at 20 mg each am   Please schedule a follow up visit in 3 months but call sooner if needed

## 2017-08-19 ENCOUNTER — Encounter: Payer: Self-pay | Admitting: Internal Medicine

## 2017-08-19 NOTE — Assessment & Plan Note (Signed)
PFT's 12/04/2014 mostly restrictive but ratio 64% so quite mild relative to PF component  - 12/04/2014   75% so try symbicort 160 2bid  - 04/23/2015  extensive coaching HFA effectiveness =    90% from a baseline of 75%  - 07/24/2015 try off symbicort at pt's request > can take up to 2 pffs q 12h prn  - 01/05/2017  After extensive coaching HFA effectiveness =    75%  - PFT's  04/13/2017  FEV1 1.89 (60 % ) ratio 83  p 7 % improvement from saba p symb 160 2 prior to study with DLCO  39 % corrects to 81 % for alv volume   - 04/13/2017   symb 80 2bid and taper prednisone to 10 mg per day and hold there = [facility] with PRED  - 08/18/2017  After extensive coaching inhaler device  effectiveness =    90%   Improved on present rx > no changes needed  I had an extended discussion with the patient reviewing all relevant studies completed to date and  lasting 15 to 20 minutes of a 25 minute visit    See device teaching which extended face to face time for this visit.  Each maintenance medication was reviewed in detail including emphasizing most importantly the difference between maintenance and prns and under what circumstances the prns are to be triggered using an action plan format that is not reflected in the computer generated alphabetically organized AVS which I have not found useful in most complex patients, especially with respiratory illnesses  Please see AVS for specific instructions unique to this visit that I personally wrote and verbalized to the the pt in detail and then reviewed with pt  by my nurse highlighting any  changes in therapy recommended at today's visit to their plan of care.

## 2017-08-19 NOTE — Assessment & Plan Note (Signed)
-  HRCT 08/23/14  1. Pulmonary parenchymal pattern of fibrosis is likely due to usual interstitial pneumonitis (UIP). 2. Enlarged pulmonary arteries, indicative of pulmonary arterial Hypertension. -09/11/2014  Walked RA x 3 laps @ 185 ft each stopped due to End of study, nl pace, no sob or desat  - PFT's  12/04/2014  FEV1 1.39 (43 % ) ratio 64  p no % improvement from saba with DLCO  53 % corrects to 111 % for alv volume  And ERV 20 - 04/23/2015  Walked RA x 3 laps @ 185 ft each stopped due to  End of study, nl pace, no sob or desat   - 04/13/2017 improved on prednisone 04/13/2017 but ESR still 61 so rec try taper to pred 10 mg daily and hold there=  [facility] with PREDNISONE  - 07/06/2017  ESR 39 on pred 10mg daily  - HRCT 07/22/17 : Spectrum of findings compatible with fibrotic interstitial lung disease with mild-to-moderate honeycombing. Slight basilar predominance. Mild progression since 2016. Findings are considered diagnostic of usual interstitial pneumonia (UIP).  - 08/18/2017  Walked RA x 3 laps @ 185 ft each stopped due to  End of study, nl pace, no sob or desat  - 08/18/2017 rec prednisone reduce to 10 mg daily      The goal with a chronic steroid dependent illness is always arriving at the lowest effective dose that controls the disease/symptoms and not accepting a set "formula" which is based on statistics or guidelines that don't always take into account patient  variability or the natural hx of the dz in every individual patient, which may well vary over time.  For now therefore I recommend the patient maintain  Ceiling of 20 mg and floor of 10 mg daily    

## 2017-09-08 DIAGNOSIS — L570 Actinic keratosis: Secondary | ICD-10-CM | POA: Diagnosis not present

## 2017-09-08 DIAGNOSIS — D225 Melanocytic nevi of trunk: Secondary | ICD-10-CM | POA: Diagnosis not present

## 2017-09-08 DIAGNOSIS — L821 Other seborrheic keratosis: Secondary | ICD-10-CM | POA: Diagnosis not present

## 2017-09-08 DIAGNOSIS — M5136 Other intervertebral disc degeneration, lumbar region: Secondary | ICD-10-CM | POA: Diagnosis not present

## 2017-09-08 DIAGNOSIS — Z85820 Personal history of malignant melanoma of skin: Secondary | ICD-10-CM | POA: Diagnosis not present

## 2017-09-16 DIAGNOSIS — G5712 Meralgia paresthetica, left lower limb: Secondary | ICD-10-CM | POA: Diagnosis not present

## 2017-09-16 DIAGNOSIS — M5136 Other intervertebral disc degeneration, lumbar region: Secondary | ICD-10-CM | POA: Diagnosis not present

## 2017-09-16 DIAGNOSIS — N433 Hydrocele, unspecified: Secondary | ICD-10-CM | POA: Diagnosis not present

## 2017-09-30 DIAGNOSIS — H1131 Conjunctival hemorrhage, right eye: Secondary | ICD-10-CM | POA: Diagnosis not present

## 2017-10-09 ENCOUNTER — Encounter: Payer: Self-pay | Admitting: Podiatry

## 2017-10-09 ENCOUNTER — Ambulatory Visit: Payer: PPO | Admitting: Podiatry

## 2017-10-09 DIAGNOSIS — M79675 Pain in left toe(s): Secondary | ICD-10-CM | POA: Diagnosis not present

## 2017-10-09 DIAGNOSIS — M79674 Pain in right toe(s): Secondary | ICD-10-CM | POA: Diagnosis not present

## 2017-10-09 DIAGNOSIS — M5136 Other intervertebral disc degeneration, lumbar region: Secondary | ICD-10-CM | POA: Diagnosis not present

## 2017-10-09 DIAGNOSIS — M479 Spondylosis, unspecified: Secondary | ICD-10-CM | POA: Diagnosis not present

## 2017-10-09 DIAGNOSIS — G629 Polyneuropathy, unspecified: Secondary | ICD-10-CM

## 2017-10-09 DIAGNOSIS — B351 Tinea unguium: Secondary | ICD-10-CM | POA: Diagnosis not present

## 2017-10-09 DIAGNOSIS — M1612 Unilateral primary osteoarthritis, left hip: Secondary | ICD-10-CM | POA: Diagnosis not present

## 2017-10-09 DIAGNOSIS — L84 Corns and callosities: Secondary | ICD-10-CM | POA: Diagnosis not present

## 2017-10-12 ENCOUNTER — Encounter: Payer: Self-pay | Admitting: Podiatry

## 2017-10-12 NOTE — Progress Notes (Signed)
Subjective: Brent [name] is an 84 yo WM who  presents today with  cc of painful, discolored, thick toenails which interfere with daily activities and routine tasks.  Pain is aggravated when wearing enclosed shoe gear. Pain is getting progressively worse and he desires professional intervention.  He has polyneuropathy for which he is followed by Neurology and also has had claudication symptoms and is followed by Vascular. His last ABIs were unchanged on 07/10/2017.  His toe pressures are noted to be abnormal.  Medical History   Date Unknown AAA (abdominal aortic aneurysm) (HCC)  Date Unknown Arthritis  Date Unknown Back pain  Date Unknown CAD (coronary artery disease)   Date Unknown COPD (chronic obstructive pulmonary disease) ([place])  Date Unknown Dizziness   Date Unknown Erectile dysfunction  Date Unknown GERD (gastroesophageal reflux disease)   Date Unknown History of blood clots   Date Unknown HTN (hypertension)   Date Unknown Hx of cardiovascular stress test   Date Unknown Hyperlipidemia   Date Unknown Myocardial infarction [facility])  Date Unknown Pulmonary embolism ([place]   Problem List   New problems from outside sources are available for reconciliation  Cardiovascular and Mediastinum  CAD (coronary artery disease)   Essential hypertension   Respiratory  Postinflammatory pulmonary fibrosis ([place])   COPD GOLD III    Other  Hyperlipidemia   S/P shoulder replacement   Dyspnea on exertion   Upper airway cough syndrome   Gait disturbance   Neck pain   Falls   Numbness   Diuretic-induced hypokalemia    Surgical History   06/02/2013 Total shoulder arthroplasty (Right)   06/25/2011 Esophagoscopy   04-28-2005 Cardiovascular stress test   1998 Coronary artery bypass graft   Date Unknown Broken Ankle [Other]  Date Unknown Broken Jaw [Other]   Date Unknown Tonsillectomy   Medications    albuterol (PROAIR HFA) 108 (90 Base) MCG/ACT inhaler    Ascorbic Acid (VITAMIN C) 1000 MG tablet    aspirin EC 81 MG tablet    budesonide-formoterol (SYMBICORT) 80-4.5 MCG/ACT inhaler    carvedilol (COREG) 3.125 MG tablet    cetirizine (ZYRTEC) 10 MG tablet    diphenhydrAMINE (ALLERGY) 25 MG tablet    DULoxetine (CYMBALTA) 60 MG capsule    levofloxacin (LEVAQUIN) 750 MG tablet    meclizine (ANTIVERT) 25 MG tablet    Multiple Vitamins-Minerals (MENS 50+ ADVANCED PO)    nitroGLYCERIN (NITROSTAT) 0.4 MG SL tablet    pantoprazole (PROTONIX) 40 MG tablet    potassium chloride (K-DUR) 10 MEQ tablet    predniSONE (DELTASONE) 10 MG tablet    rosuvastatin (CRESTOR) 40 MG tablet    furosemide (LASIX) 40 MG tablet(Expired)      Allergies     MorphineItching, Hives  Morphine And Related   Tobacco History   Smoking Status  Former Smoker  Quit 02/25/1976  Types  Cigarettes  Amount 1 packs/day for 20 years (20.00 pk-yrs)      Smokeless Tobacco Status  Never Used   Family History    Mother (Deceased) Heart attack    Stroke    Breast cancer    ROS: Per HPI unless specifically indicated in ROS section   Objective: Vascular Examination: Capillary refill time immediate x 10 digits Dorsalis pedis and posterior tibial pulses present b/l No digital hair x 10 digits Skin temperature warm to warm b/l  Dermatological Examination: Skin with normal turgor, texture and tone b/l Toenails 1-5 b/l discolored, thick, dystrophic with subungual debris and pain with palpation to  nailbeds due to thickness of nails. Hyperkeratotic lesion plantarmedial hallux b/l, medial heel b/l and submet head 5 left foot  Musculoskeletal: Muscle strength 5/5 to all LE muscle groups  Neurological: Sensation diminished with 10 gram monofilament b/l Vibratory sensation absent b/l  ABIs on 07/10/2017: [facility], ABI's        were 1.06 on the right and 1.06 on the left. Final Interpretation: Right: Resting right ankle-brachial index is within normal range. No evidence  of significant right lower extremity arterial disease. The right toe-brachial index is abnormal. PPG tracings appear dampened. Left: Resting left ankle-brachial index is within normal range. No evidence of significant left lower extremity arterial disease. The left toe-brachial index is abnormal. PPG tracings appear dampened.  Assessment: Painful onychomycosis toenails 1-5 b/l  Polyneuropathy Calluses plantarmedial hallux b/l, medial heel b/l and submet head 5 left foot  Plan: 1. Toenails 1-5 b/l were debrided in length and girth without iatrogenic bleeding. 2.  Calluses debrided plantarmedial hallux b/l, medial heel b/l and submet head 5 left foot 3.  Patient to continue soft, supportive shoe gear 4.  Patient to report any pedal injuries to medical professional immediately. 5.  Follow up 3 months.  6.  Patient/POA to call should there be a concern in the interim.

## 2017-10-27 ENCOUNTER — Other Ambulatory Visit: Payer: Self-pay | Admitting: Internal Medicine

## 2017-11-17 ENCOUNTER — Ambulatory Visit: Payer: PPO | Admitting: Internal Medicine

## 2017-11-17 ENCOUNTER — Encounter: Payer: Self-pay | Admitting: Internal Medicine

## 2017-11-17 VITALS — BP 132/80 | HR 60 | Ht 72.75 in | Wt 242.0 lb

## 2017-11-17 DIAGNOSIS — J8410 Pulmonary fibrosis, unspecified: Secondary | ICD-10-CM | POA: Diagnosis not present

## 2017-11-17 DIAGNOSIS — J449 Chronic obstructive pulmonary disease, unspecified: Secondary | ICD-10-CM

## 2017-11-17 MED ORDER — BUDESONIDE-FORMOTEROL FUMARATE 80-4.5 MCG/ACT IN AERO: 2.0000 | INHALATION_SPRAY | Freq: Two times a day (BID) | RESPIRATORY_TRACT | 0 refills | Status: AC

## 2017-11-17 NOTE — Patient Instructions (Addendum)
Reduce the prednisone 10 mg one each am with breakfast - if worse go back to previous dose   GERD (REFLUX)  is an extremely common cause of respiratory symptoms just like yours , many times with no obvious heartburn at all.    It can be treated with medication, but also with lifestyle changes including elevation of the head of your bed (ideally with 6 inch  bed blocks),  Smoking cessation, avoidance of late meals, excessive alcohol, and avoid fatty foods, chocolate, peppermint, colas, red wine, and acidic juices such as orange juice.  NO MINT OR MENTHOL PRODUCTS SO NO COUGH DROPS   USE SUGARLESS CANDY INSTEAD (Jolley ranchers or Stover's or Life Savers) or even ice chips will also do - the key is to swallow to prevent all throat clearing. NO OIL BASED VITAMINS - use powdered substitutes.    Please schedule a follow up visit in 6  months but call sooner if needed with pfts on return

## 2017-11-17 NOTE — Assessment & Plan Note (Signed)
PFT's 12/04/2014 mostly restrictive but ratio 64% so quite mild relative to PF component  - 12/04/2014   75% so try symbicort 160 2bid  - 04/23/2015  extensive coaching HFA effectiveness =    90% from a baseline of 75%  - 07/24/2015 try off symbicort at pt's request > can take up to 2 pffs q 12h prn  - 01/05/2017  After extensive coaching HFA effectiveness =    75%  - PFT's  04/13/2017  FEV1 1.89 (60 % ) ratio 83  p 7 % improvement from saba p symb 160 2 prior to study with DLCO  39 % corrects to 81 % for alv volume   - 04/13/2017   symb 80 2bid and taper prednisone to 10 mg per day and hold there = [facility] RX with PRED - 08/18/2017  After extensive coaching inhaler device  effectiveness =    90%   He really has minimal asthmatic component while on pred systemically but now being tapered so critical he continue symbicort 80 2bid and step up to the 160 if any flares with the lower dose of prednisone/ reviewed    I had an extended discussion with the patient reviewing all relevant studies completed to date and  lasting 15 to 20 minutes of a 25 minute visit    Each maintenance medication was reviewed in detail including most importantly the difference between maintenance and prns and under what circumstances the prns are to be triggered using an action plan format that is not reflected in the computer generated alphabetically organized AVS.     Please see AVS for specific instructions unique to this visit that I personally wrote and verbalized to the the pt in detail and then reviewed with pt  by my nurse highlighting any  changes in therapy recommended at today's visit to their plan of care.

## 2017-11-17 NOTE — Progress Notes (Signed)
Subjective:    Patient ID: Brent [name], male    DOB: 04-24-1931,    MRN: [id number]    Brief patient profile:  27  yowm quit smoking in 1978 with cabg late 1990's  No trouble breathing until around summer 2015 then cough onset x 05/2014 with neg cardiac w/u by nishan> referred 09/11/2014  by [name] with PF on CT 08/23/14 and GOLD III critieria for copd       History of Present Illness  09/11/2014 1st [facility] office visit/ Bernhard Koskinen   Chief Complaint  Patient presents with  . Pulmonary Consult    Referred by [name]. Pt c/o cough, wheezing and SOB for the past 2 months. He states he gets SOB with walking 100 yrds. His cough is mainly non prod but will occ produce some clear sputum.   dyspnea first symptom he noted  x one year just with exertion indolent onset / minimally progressive then coughing x 2-3 months assoc with hb  And since onset of cough having more sob.   Cough not better with saba/ some better p pred/breo rec Try prilosec otc 20mg  x 2  Take 30-60 min before first meal of the day and Pepcid ac (famotidine) 20 mg one @  bedtime until return (over the counter) GERD diet     01/05/2017  f/u ov/Loriene Taunton re:  GOLD III copd/ PF  Chief Complaint  Patient presents with  . Follow-up    Breathing is overall doing well. He is coughing some with white sputum. He states he was txed for bronchitis by his PCP recently.   already finished zpak, / prednisone  Improved/ chronically not taking ppi consistently  Doe back to [facility] =1  rec Plan A = Automatic =   Symbicort  160 Take 2 puffs first thing in am and then another 2 puffs about 12 hours later.  Work on inhaler technique:   Plan B = Backup Only use your albuterol as a rescue medication     NP  05/11/17 Continue on current regimen Follow med calendar closely and bring to each visit      07/06/2017  f/u ov/Camrynn Mcclintic re:  Copd III/ pf - no med calendar on pred 10 mg daily  Chief Complaint  Patient presents with  .  Acute Visit    Increased SOB for the past 2 wks. He states that he gets SOB when he lies down and with exertion such as walking short distances and up stairs.   Dyspnea:  More sob esp steps x 2 weeks, indolent onset  Cough: none Sleep: L side down / horizontal otherwise sob supine  SABA use:  Has not tried saba  inhalers to see if they help rec See calendar for specific medication instructions and bring it back    08/18/2017  f/u ov/Deiondre Harrower re:  COPD III/ pf no med cal on pred 10 mg bid (misunderstood rec for 2 q am)  - no med calendar  Chief Complaint  Patient presents with  . Follow-up    Breathing is "100% better". He has been taking pred 10 mg twice daily.  He has not had to use his albuterol inhaler.   Dyspnea:  Steps better / [employer] nl pace Cough: none  SABA use: none 02: none   rec Prednisone 10 mg one on odd and two on even x 2 weeks then try try just one daily an if get worse start back at 20 mg each am  11/17/2017  f/u ov/Kymorah Korf re:  Copd III/ pf misunderstood instructions and still on pred 20/10  Chief Complaint  Patient presents with  . Follow-up    Breathing has been doing well.  He states he rarely uses his albuterol.    Dyspnea:  Still able to wm at nl pace - no change on days with 20 vs 10 prednisone Cough: none Sleeping: bed blocks x 5 in / 1 pillow, lies on L side due to gerd on R side  SABA use: rarely 02: none     No obvious day to day or daytime variability or assoc excess/ purulent sputum or mucus plugs or hemoptysis or cp or chest tightness, subjective wheeze or overt sinus  symptoms.   Sleeping as above  without nocturnal  or early am exacerbation  of respiratory  c/o's or need for noct saba. Also denies any obvious fluctuation of symptoms with weather or environmental changes or other aggravating or alleviating factors except as outlined above   No unusual exposure hx or h/o childhood pna/ asthma or knowledge of premature birth.  Current Allergies,  Complete Past Medical History, Past Surgical History, Family History, and Social History were reviewed in [employer] record.  ROS  The following are not active complaints unless bolded Hoarseness, sore throat, dysphagia, dental problems, itching, sneezing,  nasal congestion or discharge of excess mucus or purulent secretions, ear ache,   fever, chills, sweats, unintended wt loss or wt gain, classically pleuritic or exertional cp,  orthopnea pnd or arm/hand swelling  or leg swelling, presyncope, palpitations, abdominal pain, anorexia, nausea, vomiting, diarrhea  or change in bowel habits or change in bladder habits, change in stools or change in urine, dysuria, hematuria,  rash, arthralgias, visual complaints, headache, numbness, weakness or ataxia or problems with walking or coordination,  change in mood or  memory.        Current Meds  Medication Sig  . albuterol (PROAIR HFA) 108 (90 Base) MCG/ACT inhaler Inhale 2 puffs into the lungs every 6 (six) hours as needed for wheezing or shortness of breath.  . Ascorbic Acid (VITAMIN C) 1000 MG tablet Take 1,000 mg by mouth daily.  Marland Kitchen aspirin EC 81 MG tablet Take 81 mg by mouth at bedtime.   . budesonide-formoterol (SYMBICORT) 80-4.5 MCG/ACT inhaler Inhale 2 puffs into the lungs 2 (two) times daily.  . carvedilol (COREG) 3.125 MG tablet Take 1 tablet (3.125 mg total) by mouth 2 (two) times daily.  . cetirizine (ZYRTEC) 10 MG tablet Take 10 mg by mouth at bedtime.  . diphenhydrAMINE (ALLERGY) 25 MG tablet Take 25 mg by mouth as needed.  . DULoxetine (CYMBALTA) 60 MG capsule Take 60 mg by mouth daily.  . meclizine (ANTIVERT) 25 MG tablet Take 25 mg by mouth as needed.  . Multiple Vitamins-Minerals (MENS 50+ ADVANCED PO) Take 1 tablet by mouth daily.  . nitroGLYCERIN (NITROSTAT) 0.4 MG SL tablet PLACE 1 TABLET UNDER THE TONGUE EVERY 5 MINUTES AS NEEDED FOR CHEST PAIN UP TO 3 DOSES, IF SYMPTOMS PERSIST CALL 911  . pantoprazole  (PROTONIX) 40 MG tablet TAKE 1 TABLET BY MOUTH TWICE DAILY  . potassium chloride (K-DUR) 10 MEQ tablet Take 1 tablet (10 mEq total) by mouth daily.  . predniSONE (DELTASONE) 10 MG tablet Take one daily - take two if breathing worse, both at breakfast  . rosuvastatin (CRESTOR) 40 MG tablet TAKE 1 TABLET BY MOUTH EVERY DAY  .  Objective:   Physical Exam  amb wm nad  Vital signs reviewed - Note on arrival 02 sats  97% on RA     10/23/2014       250 >248 11/14/2014 > 12/04/2014    248  > 04/23/2015   261 > 07/24/2015  258 > 09/29/2016   249 > 01/05/2017  240 > 01/05/2017   240 > 04/13/2017   234 > 07/06/2017  233 > 08/18/2017  236 > 11/17/2017 242      HEENT: nl dentition, turbinates bilaterally, and oropharynx. Nl external ear canals without cough reflex   NECK :  without JVD/Nodes/TM/ nl carotid upstrokes bilaterally   LUNGS: no acc muscle use,  Nl contour chest which is clear to A and P bilaterally without cough on insp or exp maneuvers   CV:  RRR  no s3 or murmur or increase in P2, and trace bilateral ankle edema   ABD:  soft and nontender with nl inspiratory excursion in the supine position. No bruits or organomegaly appreciated, bowel sounds nl  MS:  Nl gait/ ext warm without deformities, calf tenderness, cyanosis  - nail bed yellow, no def clubbing  No obvious joint restrictions   SKIN: warm and dry without lesions    NEURO:  alert, approp, nl sensorium with  no motor or cerebellar deficits apparent.                   I personally reviewed images and agree with radiology impression as follows:   Chest HRCT  07/22/17 1. Spectrum of findings compatible with fibrotic interstitial lung disease with mild-to-moderate honeycombing. Slight basilar predominance. Mild progression since 2016. Findings are considered diagnostic of usual interstitial pneumonia (UIP).          Assessment & Plan:

## 2017-11-17 NOTE — Assessment & Plan Note (Addendum)
-  HRCT 08/23/14  1. Pulmonary parenchymal pattern of fibrosis is likely due to usual interstitial pneumonitis (UIP). 2. Enlarged pulmonary arteries, indicative of pulmonary arterial Hypertension. -09/11/2014  Walked RA x 3 laps @ 185 ft each stopped due to End of study, nl pace, no sob or desat  - PFT's  12/04/2014  FEV1 1.39 (43 % ) ratio 64  p no % improvement from saba with DLCO  53 % corrects to 111 % for alv volume  And ERV 20 - 04/23/2015  Walked RA x 3 laps @ 185 ft each stopped due to  End of study, nl pace, no sob or desat   - 04/13/2017 improved on prednisone 04/13/2017 but ESR still 61 so rec try taper to pred 10 mg daily and hold there=  [facility] with PREDNISONE  - 07/06/2017  ESR 39 on pred 63m daily  - HRCT 07/22/17 : Spectrum of findings compatible with fibrotic interstitial lung disease with mild-to-moderate honeycombing. Slight basilar predominance. Mild progression since 2016. Findings are considered diagnostic of usual interstitial pneumonia (UIP).  - 08/18/2017  Walked RA x 3 laps @ 185 ft each stopped due to  End of study, nl pace, no sob or desat  - 08/18/2017 rec prednisone reduce to 10 mg daily > misunderstood and only did 20/10   The goal with a chronic steroid dependent illness is always arriving at the lowest effective dose that controls the disease/symptoms and not accepting a set "formula" which is based on statistics or guidelines that don't always take into account patient  variability or the natural hx of the dz in every individual patient, which may well vary over time.  For now therefore I recommend the patient maintain  10 mg daily if tolerates and if not back to 20/10 using his ex tol as a measuring stick.  Also note overt HB symptoms if not sleeping in certain positions > reviewed impt of gerd control in pt with pf on systemic steroids   Return in 6 months for full pfts for apples to apples comparison to priors

## 2017-11-18 DIAGNOSIS — M1612 Unilateral primary osteoarthritis, left hip: Secondary | ICD-10-CM | POA: Diagnosis not present

## 2017-11-25 DIAGNOSIS — D225 Melanocytic nevi of trunk: Secondary | ICD-10-CM | POA: Diagnosis not present

## 2017-11-25 DIAGNOSIS — L821 Other seborrheic keratosis: Secondary | ICD-10-CM | POA: Diagnosis not present

## 2017-11-25 DIAGNOSIS — L570 Actinic keratosis: Secondary | ICD-10-CM | POA: Diagnosis not present

## 2017-11-25 DIAGNOSIS — L218 Other seborrheic dermatitis: Secondary | ICD-10-CM | POA: Diagnosis not present

## 2017-12-03 DIAGNOSIS — M1612 Unilateral primary osteoarthritis, left hip: Secondary | ICD-10-CM | POA: Diagnosis not present

## 2017-12-30 DIAGNOSIS — M1612 Unilateral primary osteoarthritis, left hip: Secondary | ICD-10-CM | POA: Diagnosis not present

## 2017-12-31 ENCOUNTER — Other Ambulatory Visit: Payer: Self-pay | Admitting: Internal Medicine

## 2017-12-31 DIAGNOSIS — H25813 Combined forms of age-related cataract, bilateral: Secondary | ICD-10-CM | POA: Diagnosis not present

## 2017-12-31 DIAGNOSIS — H40051 Ocular hypertension, right eye: Secondary | ICD-10-CM | POA: Diagnosis not present

## 2017-12-31 DIAGNOSIS — H52223 Regular astigmatism, bilateral: Secondary | ICD-10-CM | POA: Diagnosis not present

## 2017-12-31 DIAGNOSIS — H40011 Open angle with borderline findings, low risk, right eye: Secondary | ICD-10-CM | POA: Diagnosis not present

## 2017-12-31 DIAGNOSIS — H5203 Hypermetropia, bilateral: Secondary | ICD-10-CM | POA: Diagnosis not present

## 2017-12-31 DIAGNOSIS — H524 Presbyopia: Secondary | ICD-10-CM | POA: Diagnosis not present

## 2018-01-05 ENCOUNTER — Telehealth: Payer: Self-pay | Admitting: *Deleted

## 2018-01-05 NOTE — Telephone Encounter (Signed)
   Primary Cardiologist:Philip Nahser, MD  Chart reviewed as part of pre-operative protocol coverage. Because of DESIDERIO DOLATA past medical history and time since last visit, he/she will require a follow-up visit in order to better assess preoperative cardiovascular risk.  Patient last seen 07/07/17. Almost 6 months ago. Surgery is not until 04/07/18. Please schedule appointment early jan 2020 for surgical clearance on day Dr. Acie Fredrickson in clinic.   Pre-op covering staff: - Please schedule appointment and call patient to inform them. - Please contact requesting surgeon's office via preferred method (i.e, phone, fax) to inform them of need for appointment prior to surgery.   [place], [place]  01/05/2018, 2:19 PM

## 2018-01-05 NOTE — Telephone Encounter (Signed)
Pt ha been scheduled to see Dr. Acie Fredrickson 03/05/18 @ 8:00.

## 2018-01-05 NOTE — Telephone Encounter (Signed)
   [facility] Pre-operative Risk Assessment    Request for surgical clearance:  1. What type of surgery is being performed? LEFT TOTAL HIP   2. When is this surgery scheduled? 04/07/18   3. Are there any medications that need to be held prior to surgery and how long? ASA   4. Practice name and name of physician performing surgery? [facility]; DR. Maureen Ralphs   5. What is your office phone and fax number? PH# [phone]; FAX# [phone]   6. Anesthesia type (None, local, MAC, general) ? CHOICE    Brent [name] 01/05/2018, 9:14 AM  _________________________________________________________________   (provider comments below)

## 2018-01-08 ENCOUNTER — Ambulatory Visit: Payer: PPO | Admitting: Podiatry

## 2018-01-13 ENCOUNTER — Other Ambulatory Visit: Payer: Self-pay

## 2018-01-13 ENCOUNTER — Encounter: Payer: Self-pay | Admitting: Neurology

## 2018-01-13 ENCOUNTER — Ambulatory Visit (INDEPENDENT_AMBULATORY_CARE_PROVIDER_SITE_OTHER): Payer: PPO | Admitting: Neurology

## 2018-01-13 VITALS — BP 167/88 | HR 56 | Ht 72.75 in | Wt 244.0 lb

## 2018-01-13 DIAGNOSIS — M1610 Unilateral primary osteoarthritis, unspecified hip: Secondary | ICD-10-CM | POA: Insufficient documentation

## 2018-01-13 DIAGNOSIS — I2510 Atherosclerotic heart disease of native coronary artery without angina pectoris: Secondary | ICD-10-CM | POA: Diagnosis not present

## 2018-01-13 DIAGNOSIS — R269 Unspecified abnormalities of gait and mobility: Secondary | ICD-10-CM | POA: Diagnosis not present

## 2018-01-13 DIAGNOSIS — W19XXXD Unspecified fall, subsequent encounter: Secondary | ICD-10-CM

## 2018-01-13 NOTE — Patient Instructions (Signed)
Neck Exercises Neck exercises can be important for many reasons:  They can help you to improve and maintain flexibility in your neck. This can be especially important as you age.  They can help to make your neck stronger. This can make movement easier.  They can reduce or prevent neck pain.  They may help your upper back.  Ask your health care provider which neck exercises would be best for you. Exercises Neck Press Repeat this exercise 10 times. Do it first thing in the morning and right before bed or as told by your health care provider. 1. Lie on your back on a firm bed or on the floor with a pillow under your head. 2. Use your neck muscles to push your head down on the pillow and straighten your spine. 3. Hold the position as well as you can. Keep your head facing up and your chin tucked. 4. Slowly count to 5 while holding this position. 5. Relax for a few seconds. Then repeat.  Isometric Strengthening Do a full set of these exercises 2 times a day or as told by your health care provider. 1. Sit in a supportive chair and place your hand on your forehead. 2. Push forward with your head and neck while pushing back with your hand. Hold for 10 seconds. 3. Relax. Then repeat the exercise 3 times. 4. Next, do thesequence again, this time putting your hand against the back of your head. Use your head and neck to push backward against the hand pressure. 5. Finally, do the same exercise on either side of your head, pushing sideways against the pressure of your hand.  Prone Head Lifts Repeat this exercise 5 times. Do this 2 times a day or as told by your health care provider. 1. Lie face-down, resting on your elbows so that your chest and upper back are raised. 2. Start with your head facing downward, near your chest. Position your chin either on or near your chest. 3. Slowly lift your head upward. Lift until you are looking straight ahead. Then continue lifting your head as far back as  you can stretch. 4. Hold your head up for 5 seconds. Then slowly lower it to your starting position.  Supine Head Lifts Repeat this exercise 8-10 times. Do this 2 times a day or as told by your health care provider. 1. Lie on your back, bending your knees to point to the ceiling and keeping your feet flat on the floor. 2. Lift your head slowly off the floor, raising your chin toward your chest. 3. Hold for 5 seconds. 4. Relax and repeat.  Scapular Retraction Repeat this exercise 5 times. Do this 2 times a day or as told by your health care provider. 1. Stand with your arms at your sides. Look straight ahead. 2. Slowly pull both shoulders backward and downward until you feel a stretch between your shoulder blades in your upper back. 3. Hold for 10-30 seconds. 4. Relax and repeat.  Contact a health care provider if:  Your neck pain or discomfort gets much worse when you do an exercise.  Your neck pain or discomfort does not improve within 2 hours after you exercise. If you have any of these problems, stop exercising right away. Do not do the exercises again unless your health care provider says that you can. Get help right away if:  You develop sudden, severe neck pain. If this happens, stop exercising right away. Do not do the exercises again unless your   health care provider says that you can. Exercises Neck Stretch  Repeat this exercise 3-5 times. 1. Do this exercise while standing or while sitting in a chair. 2. Place your feet flat on the floor, shoulder-width apart. 3. Slowly turn your head to the right. Turn it all the way to the right so you can look over your right shoulder. Do not tilt or tip your head. 4. Hold this position for 10-30 seconds. 5. Slowly turn your head to the left, to look over your left shoulder. 6. Hold this position for 10-30 seconds.  Neck Retraction Repeat this exercise 8-10 times. Do this 3-4 times a day or as told by your health care  provider. 1. Do this exercise while standing or while sitting in a sturdy chair. 2. Look straight ahead. Do not bend your neck. 3. Use your fingers to push your chin backward. Do not bend your neck for this movement. Continue to face straight ahead. If you are doing the exercise properly, you will feel a slight sensation in your throat and a stretch at the back of your neck. 4. Hold the stretch for 1-2 seconds. Relax and repeat.  This information is not intended to replace advice given to you by your health care provider. Make sure you discuss any questions you have with your health care provider. Document Released: 01/22/2015 Document Revised: 07/19/2015 Document Reviewed: 08/21/2014 [employer]vier Interactive Patient Education  2018 [employer]  

## 2018-01-13 NOTE — Progress Notes (Signed)
[facility]  PATIENT: Brent [name] DOB: 30-May-1931  REFERRING DOCTOR OR PCP:  Dr. Alyson Ingles SOURCE: Patient, notes from PCP, reports, lab reports, MRI images on PACS.  _________________________________   HISTORICAL  CHIEF COMPLAINT:  Chief Complaint  Patient presents with  . Follow-up    RM 13, alone. Last seen 02/25/17. Here to get clearence for left hip replacement surgery scheduled for 04/15/18 at [facility].   Marland Kitchen Gait Problem    Has had about 3 falls in last year.     HISTORY OF PRESENT ILLNESS:  Brent [name] is an 84 year old man with poor gait.  Update 01/13/2018: He feel his gait has worsened but notes more issues including left hip DJD na worse COPD>    He is going to have a left hip replacement soon.   He has pain in his feet, left > right that is better with walking.   He had a fall 6 weeks ago.  He was climbing stairs on his deck and his left foot caught.  He hit his head on the smoker.    He was more sore x 2 weeks.      He denies any tremors.     He feels his ability to turn is about the same as earlier this year.    I personally reviewed the MRIs of the brain and cervical spine in Mr. Reger presents.  The MRI of the brain shows significant atrophy and mild chronic microvascular ischemic changes but no acute findings.  There is no evidence of communicating hydrocephalus.  No extra-axial collections of fluid.  The MRI of the spine shows some degenerative changes but no spinal cord compression.  From 02/25/2017: He was first noted to have difficulty with his gait about 6-7 months ago.   Since the summer, he has had more trouble with his gait and notes steady worsening.Marland Kitchen     He has fallen 4 times since then.   The last fall was 4 days ago and he hit the right cheek but there was no loss of consciousness.   His gait has developed a short stride and wide stance.  He takes about 12 steps to turn 180 degrees.    He feels his legs have been weaker several years and he has  more weakness more recently.    He has noted urinary frequency and urgency with occasionall incontinence which seems worse recently.   He had fecal incontinence over the summer once.     He also has noted reduced strength in his arms the last 2 years.    He feels the muscles in his right arm are smaller and weaker.    He has not noted fasciculations.    He has neck pain which has worsened the last few months.    He denies numbness in his arms but notes a little bit of numbness in his feet at times.    He and his family have not noted any change in cognition.   He has mild reduced short term memory.   The physical decline has affected his ability to function in that he could play 9-18 holes golf over the summer and now could not do one hole.   He does not feel safe going up and down stairs. He is having more trouble getting out of a chair.  I personally reviewed the MRI of the lumbar spine performed 12/26/2016. There are mild multilevel degenerative changes with potential for left L4 nerve root compression due  to lateral recess stenosis at L3-L4. Additionally there is mild foraminal narrowing to the right at L4-L5 .  These changes are not significant enough to explain his gait difficulties   REVIEW OF SYSTEMS: Constitutional: No fevers, chills, sweats, or change in appetite Eyes: No visual changes, double vision, eye pain Ear, nose and throat: No hearing loss, ear pain, nasal congestion, sore throat Cardiovascular: No chest pain, palpitations Respiratory: He has some wheezes. She has COPD. Used to be a smoker. He quit 5 years ago. GastrointestinaI: No nausea, vomiting, diarrhea, abdominal pain, has one episode fecal incontinence Genitourinary: He has some urinary frequency and urgency with occasional incontinence.. Musculoskeletal: No neck pain, back pain Integumentary: No rash, pruritus, skin lesions Neurological: as above Psychiatric: No depression at this time.  No anxiety Endocrine: No  palpitations, diaphoresis, change in appetite, change in weigh or increased thirst Hematologic/Lymphatic: No anemia, purpura, petechiae. Allergic/Immunologic: No itchy/runny eyes, nasal congestion, recent allergic reactions, rashes  ALLERGIES: Allergies  Allergen Reactions  . Morphine Itching and Hives  . Morphine And Related     Itch     HOME MEDICATIONS:  Current Outpatient Medications:  .  albuterol (PROAIR HFA) 108 (90 Base) MCG/ACT inhaler, Inhale 2 puffs into the lungs every 6 (six) hours as needed for wheezing or shortness of breath., Disp: 1 Inhaler, Rfl: 5 .  Ascorbic Acid (VITAMIN C) 1000 MG tablet, Take 1,000 mg by mouth daily., Disp: , Rfl:  .  aspirin EC 81 MG tablet, Take 81 mg by mouth at bedtime. , Disp: , Rfl:  .  budesonide-formoterol (SYMBICORT) 80-4.5 MCG/ACT inhaler, Inhale 2 puffs into the lungs 2 (two) times daily., Disp: 1 Inhaler, Rfl: 0 .  carvedilol (COREG) 3.125 MG tablet, Take 1 tablet (3.125 mg total) by mouth 2 (two) times daily., Disp: 180 tablet, Rfl: 2 .  cetirizine (ZYRTEC) 10 MG tablet, Take 10 mg by mouth at bedtime., Disp: , Rfl:  .  diphenhydrAMINE (ALLERGY) 25 MG tablet, Take 25 mg by mouth as needed., Disp: , Rfl:  .  DULoxetine (CYMBALTA) 60 MG capsule, Take 60 mg by mouth daily., Disp: , Rfl:  .  meclizine (ANTIVERT) 25 MG tablet, Take 25 mg by mouth as needed., Disp: , Rfl:  .  Multiple Vitamins-Minerals (MENS 50+ ADVANCED PO), Take 1 tablet by mouth daily., Disp: , Rfl:  .  nitroGLYCERIN (NITROSTAT) 0.4 MG SL tablet, PLACE 1 TABLET UNDER THE TONGUE EVERY 5 MINUTES AS NEEDED FOR CHEST PAIN UP TO 3 DOSES, IF SYMPTOMS PERSIST CALL 911, Disp: 25 tablet, Rfl: 3 .  pantoprazole (PROTONIX) 40 MG tablet, TAKE 1 TABLET BY MOUTH TWICE DAILY, Disp: 60 tablet, Rfl: 1 .  potassium chloride (K-DUR) 10 MEQ tablet, Take 1 tablet (10 mEq total) by mouth daily., Disp: 90 tablet, Rfl: 3 .  predniSONE (DELTASONE) 10 MG tablet, Take one daily - take two if  breathing worse, both at breakfast, Disp: 100 tablet, Rfl: 2 .  rosuvastatin (CRESTOR) 40 MG tablet, TAKE 1 TABLET BY MOUTH EVERY DAY, Disp: 30 tablet, Rfl: 7 .  furosemide (LASIX) 40 MG tablet, Take 1 tablet (40 mg total) by mouth daily., Disp: 90 tablet, Rfl: 3  PAST MEDICAL HISTORY: Past Medical History:  Diagnosis Date  . AAA (abdominal aortic aneurysm) ([place])   . Arthritis   . Back pain   . CAD (coronary artery disease)    Status post CABG  . COPD (chronic obstructive pulmonary disease) ([place])   . Dizziness    takes  Antivert daily  . Erectile dysfunction   . GERD (gastroesophageal reflux disease)    takes Omeprazole daily  . History of blood clots    to the lungs   . HTN (hypertension)    takes Amlodipine and Micardis daily  . Hx of cardiovascular stress test    Lexiscan Myoview (2/16):  Possible apical infarct, no ischemia, EF 51%; Low Risk  . Hyperlipidemia    takes Crestor daily  . Myocardial infarction ([place])   . Pulmonary embolism ([place])     PAST SURGICAL HISTORY: Past Surgical History:  Procedure Laterality Date  . Broken Ankle    . Broken Jaw     20+ years ago  . CARDIOVASCULAR STRESS TEST  04-28-2005   EF 53%  . CORONARY ARTERY BYPASS GRAFT  1998   there are sequential 90% stenosis in the proximal LAD, and the mid and distal LAD is a moderate sized vessel. The mid and first diagonal branch has a 50-60% stenosis.   Marland Kitchen ESOPHAGOSCOPY  06/25/2011   Procedure: ESOPHAGOSCOPY;  Surgeon: Izora Gala, MD;  Location: [facility];  Service: ENT;  Laterality: N/A;  Direct Esophagoscopy  . TONSILLECTOMY    . TOTAL SHOULDER ARTHROPLASTY Right 06/02/2013   Procedure: RIGHT TOTAL SHOULDER ARTHROPLASTY;  Surgeon: Marin Shutter, MD;  Location: [facility];  Service: Orthopedics;  Laterality: Right;    FAMILY HISTORY: Family History  Problem Relation Age of Onset  . Heart attack Mother   . Stroke Mother   . Breast cancer Mother     SOCIAL HISTORY:  Social History   Socioeconomic  History  . Marital status: Married    Spouse name: Not on file  . Number of children: Not on file  . Years of education: Not on file  . Highest education level: Not on file  Occupational History  . Occupation: Retired- Wellsite geologist  . Financial resource strain: Not on file  . Food insecurity:    Worry: Not on file    Inability: Not on file  . Transportation needs:    Medical: Not on file    Non-medical: Not on file  Tobacco Use  . Smoking status: Former Smoker    Packs/day: 1.00    Years: 20.00    Pack years: 20.00    Types: Cigarettes    Last attempt to quit: 02/25/1976    Years since quitting: 41.9  . Smokeless tobacco: Never Used  Substance and Sexual Activity  . Alcohol use: Yes    Alcohol/week: 1.0 standard drinks    Types: 1 Glasses of wine per week    Comment: 1-2 beers per week  . Drug use: No  . Sexual activity: Not on file  Lifestyle  . Physical activity:    Days per week: Not on file    Minutes per session: Not on file  . Stress: Not on file  Relationships  . Social connections:    Talks on phone: Not on file    Gets together: Not on file    Attends religious service: Not on file    Active member of club or organization: Not on file    Attends meetings of clubs or organizations: Not on file    Relationship status: Not on file  . Intimate partner violence:    Fear of current or ex partner: Not on file    Emotionally abused: Not on file    Physically abused: Not on file    Forced sexual activity: Not on  file  Other Topics Concern  . Not on file  Social History Narrative   Left handed       Caffeine use: Coffee daily     PHYSICAL EXAM  Vitals:   01/13/18 0955  BP: (!) 167/88  Pulse: (!) 56  Weight: 244 lb (110.7 kg)  Height: 6' 0.75" (1.848 m)    Body mass index is 32.41 kg/m.   General: The patient is well-developed and well-nourished and in no acute distress  Neck:    The neck is nontender with mild reduced range of  motion.   Neurologic Exam  Mental status: The patient is alert and oriented x 3 at the time of the examination. The patient has apparent normal recent and remote memory, with an apparently normal attention span and concentration ability.   Speech is normal.  Cranial nerves: Extraocular movements are full.  Facial strength and sensation is normal.  Trapezius strength is normal.. No obvious hearing deficits are noted.  Motor:  Muscle bulk is normal.   Tone is normal. Strength is  5 / 5 in the left arm and 4+/5 in the right triceps and intrinsic hand muscles..   Sensory: He has intact sensation to touch, temperature and vibration in the arms. He has moderately reduced vibration sensation in the toes and mild reduced vibration sensation at the ankles. He also has reduced sensation to temperature and touch in both feet to above the ankles..  Coordination: Cerebellar testing reveals good finger-nose-finger and reduced heel-to-shin bilaterally.  Gait and station: He has trouble getting out of a chair without using his arms. The station is normal. The gait is wide with a short stride. He takes 3-4 steps to turn 180 (improved, was 5 at the last visit).  The tandem gait is wide.. Romberg sign is negative..   Reflexes: Deep tendon reflexes are symmetric and normal in the arms and knees and absent at the ankles.  Normal plantar responses    DIAGNOSTIC DATA (LABS, IMAGING, TESTING) - I reviewed patient records, labs, notes, testing and imaging myself where available.  Lab Results  Component Value Date   WBC 9.3 07/06/2017   HGB 14.3 07/06/2017   HCT 43.3 07/06/2017   MCV 92.7 07/06/2017   PLT 291.0 07/06/2017      Component Value Date/Time   NA 143 07/06/2017 1157   NA 138 02/25/2017 1140   K 3.3 (L) 07/06/2017 1157   CL 100 07/06/2017 1157   CO2 34 (H) 07/06/2017 1157   GLUCOSE 109 (H) 07/06/2017 1157   BUN 27 (H) 07/06/2017 1157   BUN 22 02/25/2017 1140   CREATININE 1.40 07/06/2017  1157   CREATININE 1.54 (H) August 09, 202017 1553   CALCIUM 9.3 07/06/2017 1157   PROT 6.8 02/25/2017 1140   ALBUMIN 4.0 02/25/2017 1140   AST 19 02/25/2017 1140   ALT 10 02/25/2017 1140   ALKPHOS 89 02/25/2017 1140   BILITOT 0.8 02/25/2017 1140   GFRNONAA 39 (L) 02/25/2017 1140   GFRAA 45 (L) 02/25/2017 1140   Lab Results  Component Value Date   CHOL 159 06/11/2016   HDL 37 (L) 06/11/2016   LDLCALC 87 06/11/2016   TRIG 177 (H) 06/11/2016   CHOLHDL 4.3 06/11/2016   Lab Results  Component Value Date   HGBA1C 6.3 (H) 02/25/2017   Lab Results  Component Value Date   IRWERXVQ00 867 02/25/2017   Lab Results  Component Value Date   TSH 2.35 07/06/2017       ASSESSMENT  AND PLAN  Gait disturbance  Fall, subsequent encounter  Coronary artery disease involving native coronary artery of native heart without angina pectoris  Hip arthritis  1.   He is neurologically cleared to undergo hip replacement.  He is on aspirin, prescribed by his cardiologist (he has had CABG in the past) recommendations on duration of time off aspirin could be made by his cardiologist 2.    His gait is actually better today than earlier this year.  Gait issues are likely a combination of his degenerative arthritis with may be a slight contribution from changes noted on MRI including moderately severe atrophy.  There is no evidence of communicating hydrocephalus or subdural collections or spinal stenosis. 3.    He will return to see me as needed.  Relena Ivancic A. Felecia Shelling, MD, [facility] 96/88/6484, 72:07 PM Certified in Neurology, Clinical Neurophysiology, Sleep Medicine, Pain Medicine and Neuroimaging  [facility] [address], [place], [place] [address] [phone]

## 2018-01-13 NOTE — Progress Notes (Signed)
Faxed completed/signed surgery clearance form to [facility] at [phone]. Received fax confirmation.

## 2018-01-14 DIAGNOSIS — J449 Chronic obstructive pulmonary disease, unspecified: Secondary | ICD-10-CM | POA: Diagnosis not present

## 2018-01-14 DIAGNOSIS — I119 Hypertensive heart disease without heart failure: Secondary | ICD-10-CM | POA: Diagnosis not present

## 2018-01-14 DIAGNOSIS — Z0000 Encounter for general adult medical examination without abnormal findings: Secondary | ICD-10-CM | POA: Diagnosis not present

## 2018-01-14 DIAGNOSIS — I2510 Atherosclerotic heart disease of native coronary artery without angina pectoris: Secondary | ICD-10-CM | POA: Diagnosis not present

## 2018-01-14 DIAGNOSIS — Z1389 Encounter for screening for other disorder: Secondary | ICD-10-CM | POA: Diagnosis not present

## 2018-01-14 DIAGNOSIS — K219 Gastro-esophageal reflux disease without esophagitis: Secondary | ICD-10-CM | POA: Diagnosis not present

## 2018-01-14 DIAGNOSIS — I5022 Chronic systolic (congestive) heart failure: Secondary | ICD-10-CM | POA: Diagnosis not present

## 2018-01-14 DIAGNOSIS — G2581 Restless legs syndrome: Secondary | ICD-10-CM | POA: Diagnosis not present

## 2018-01-14 DIAGNOSIS — N183 Chronic kidney disease, stage 3 (moderate): Secondary | ICD-10-CM | POA: Diagnosis not present

## 2018-01-14 DIAGNOSIS — E7800 Pure hypercholesterolemia, unspecified: Secondary | ICD-10-CM | POA: Diagnosis not present

## 2018-01-14 DIAGNOSIS — I714 Abdominal aortic aneurysm, without rupture: Secondary | ICD-10-CM | POA: Diagnosis not present

## 2018-01-14 DIAGNOSIS — M545 Low back pain: Secondary | ICD-10-CM | POA: Diagnosis not present

## 2018-01-26 ENCOUNTER — Ambulatory Visit (HOSPITAL_COMMUNITY)
Admission: RE | Admit: 2018-01-26 | Discharge: 2018-01-26 | Disposition: A | Discharge status: Home / Self Care | Payer: PPO | Source: Ambulatory Visit | Attending: Family Medicine | Admitting: Family Medicine

## 2018-01-26 ENCOUNTER — Ambulatory Visit: Payer: PPO | Admitting: Vascular Surgery

## 2018-01-26 ENCOUNTER — Other Ambulatory Visit: Payer: Self-pay

## 2018-01-26 ENCOUNTER — Encounter: Payer: Self-pay | Admitting: Vascular Surgery

## 2018-01-26 VITALS — BP 156/84 | HR 60 | Temp 97.2°F | Resp 20 | Ht 72.0 in | Wt 242.0 lb

## 2018-01-26 DIAGNOSIS — I713 Abdominal aortic aneurysm, ruptured, unspecified: Secondary | ICD-10-CM

## 2018-01-26 NOTE — Progress Notes (Signed)
[facility]  Patient name: Brent [name] MRN: [id number] DOB: 1932/01/25 Sex: male  REASON FOR VISIT: Follow-up asymptomatic abdominal aortic aneurysm  HPI: Brent [name] is a 84 y.o. male here today for follow-up.  He has no new complaints.  He does have severe arthritis in his left hip and is anticipating hip replacement in February 2020.  He has no symptoms referable to his aneurysm.  Past Medical History:  Diagnosis Date  . AAA (abdominal aortic aneurysm) ([place])   . Arthritis   . Back pain   . CAD (coronary artery disease)    Status post CABG  . COPD (chronic obstructive pulmonary disease) ([place])   . Dizziness    takes Antivert daily  . Erectile dysfunction   . GERD (gastroesophageal reflux disease)    takes Omeprazole daily  . History of blood clots    to the lungs   . HTN (hypertension)    takes Amlodipine and Micardis daily  . Hx of cardiovascular stress test    Lexiscan Myoview (2/16):  Possible apical infarct, no ischemia, EF 51%; Low Risk  . Hyperlipidemia    takes Crestor daily  . Myocardial infarction ([place])   . Pulmonary embolism ([facility])     Family History  Problem Relation Age of Onset  . Heart attack Mother   . Stroke Mother   . Breast cancer Mother     SOCIAL HISTORY: Social History   Tobacco Use  . Smoking status: Former Smoker    Packs/day: 1.00    Years: 20.00    Pack years: 20.00    Types: Cigarettes    Last attempt to quit: 02/25/1976    Years since quitting: 41.9  . Smokeless tobacco: Never Used  Substance Use Topics  . Alcohol use: Yes    Alcohol/week: 1.0 standard drinks    Types: 1 Glasses of wine per week    Comment: 1-2 beers per week    Allergies  Allergen Reactions  . Morphine Itching and Hives  . Morphine And Related     Itch     Current Outpatient Medications  Medication Sig Dispense Refill  . albuterol (PROAIR HFA) 108 (90 Base) MCG/ACT inhaler Inhale 2  puffs into the lungs every 6 (six) hours as needed for wheezing or shortness of breath. 1 Inhaler 5  . Ascorbic Acid (VITAMIN C) 1000 MG tablet Take 1,000 mg by mouth daily.    Marland Kitchen aspirin EC 81 MG tablet Take 81 mg by mouth at bedtime.     . budesonide-formoterol (SYMBICORT) 80-4.5 MCG/ACT inhaler Inhale 2 puffs into the lungs 2 (two) times daily. 1 Inhaler 0  . carvedilol (COREG) 3.125 MG tablet Take 1 tablet (3.125 mg total) by mouth 2 (two) times daily. 180 tablet 2  . cetirizine (ZYRTEC) 10 MG tablet Take 10 mg by mouth at bedtime.    . diphenhydrAMINE (ALLERGY) 25 MG tablet Take 25 mg by mouth as needed.    . DULoxetine (CYMBALTA) 60 MG capsule Take 60 mg by mouth daily.    . meclizine (ANTIVERT) 25 MG tablet Take 25 mg by mouth as needed.    . Multiple Vitamins-Minerals (MENS 50+ ADVANCED PO) Take 1 tablet by mouth daily.    . nitroGLYCERIN (NITROSTAT) 0.4 MG SL tablet PLACE 1 TABLET UNDER THE TONGUE EVERY 5 MINUTES AS NEEDED FOR CHEST PAIN UP TO 3 DOSES, IF SYMPTOMS PERSIST CALL 911 25 tablet 3  . pantoprazole (PROTONIX) 40 MG tablet  TAKE 1 TABLET BY MOUTH TWICE DAILY 60 tablet 1  . potassium chloride (K-DUR) 10 MEQ tablet Take 1 tablet (10 mEq total) by mouth daily. 90 tablet 3  . predniSONE (DELTASONE) 10 MG tablet Take one daily - take two if breathing worse, both at breakfast 100 tablet 2  . rosuvastatin (CRESTOR) 40 MG tablet TAKE 1 TABLET BY MOUTH EVERY DAY 30 tablet 7  . furosemide (LASIX) 40 MG tablet Take 1 tablet (40 mg total) by mouth daily. 90 tablet 3   No current facility-administered medications for this visit.     REVIEW OF SYSTEMS:  [X]  denotes positive finding, [ ]  denotes negative finding Cardiac  Comments:  Chest pain or chest pressure:    Shortness of breath upon exertion:    Short of breath when lying flat:    Irregular heart rhythm:        Vascular    Pain in calf, thigh, or hip brought on by ambulation:    Pain in feet at night that wakes you up from your  sleep:     Blood clot in your veins:    Leg swelling:           PHYSICAL EXAM: Vitals:   01/26/18 0933  BP: (!) 156/84  Pulse: 60  Resp: 20  Temp: (!) 97.2 F (36.2 C)  TempSrc: Oral  SpO2: 97%  Weight: 242 lb (109.8 kg)  Height: 6' (1.829 m)    GENERAL: The patient is a well-nourished male, in no acute distress. The vital signs are documented above. CARDIOVASCULAR: 2+ radial and 2+ femoral pulses.  2+ popliteal pulses.  Abdomen moderately obese and no aneurysm palpable and no tenderness PULMONARY: There is good air exchange  MUSCULOSKELETAL: There are no major deformities or cyanosis. NEUROLOGIC: No focal weakness or paresthesias are detected. SKIN: There are no ulcers or rashes noted. PSYCHIATRIC: The patient has a normal affect.  DATA:  [facility] today ultrasound shows maximal diameter of his aorta at 5.1 cm.  This compares to maximal diameter by ultrasound of 5.2 cm in September 2018.  MEDICAL ISSUES: Long discussion regarding this with the patient.  On his study in September 2018, this had been a significant increase and he underwent CT scan showing actual CTs size prediction of 4.4 cm.  I am at a loss as to explanation of our ultrasound discrepancy with the CT size.  I do feel the most likely explanation is that he has had no change by 2 separate ultrasound showing essentially the same size.  I would recommend CT scan follow-up in 6 months as well.  Did explain the typical threshold for repair would be 5.5 cm.  He is comfortable with this plan we will see him again with CT of his abdomen and pelvis in 6 months    Rosetta Posner, MD [facility] Tel [phone] Pager [phone]

## 2018-02-04 DIAGNOSIS — L570 Actinic keratosis: Secondary | ICD-10-CM | POA: Diagnosis not present

## 2018-02-04 DIAGNOSIS — D485 Neoplasm of uncertain behavior of skin: Secondary | ICD-10-CM | POA: Diagnosis not present

## 2018-02-04 DIAGNOSIS — D1801 Hemangioma of skin and subcutaneous tissue: Secondary | ICD-10-CM | POA: Diagnosis not present

## 2018-02-04 DIAGNOSIS — L821 Other seborrheic keratosis: Secondary | ICD-10-CM | POA: Diagnosis not present

## 2018-02-04 DIAGNOSIS — L820 Inflamed seborrheic keratosis: Secondary | ICD-10-CM | POA: Diagnosis not present

## 2018-02-04 DIAGNOSIS — L814 Other melanin hyperpigmentation: Secondary | ICD-10-CM | POA: Diagnosis not present

## 2018-02-04 DIAGNOSIS — D229 Melanocytic nevi, unspecified: Secondary | ICD-10-CM | POA: Diagnosis not present

## 2018-02-04 DIAGNOSIS — Z85820 Personal history of malignant melanoma of skin: Secondary | ICD-10-CM | POA: Diagnosis not present

## 2018-02-08 ENCOUNTER — Other Ambulatory Visit: Payer: Self-pay | Admitting: Cardiovascular Disease

## 2018-03-01 ENCOUNTER — Other Ambulatory Visit: Payer: Self-pay | Admitting: Cardiovascular Disease

## 2018-03-05 ENCOUNTER — Ambulatory Visit (INDEPENDENT_AMBULATORY_CARE_PROVIDER_SITE_OTHER): Payer: PPO | Admitting: Cardiovascular Disease

## 2018-03-05 ENCOUNTER — Encounter: Payer: Self-pay | Admitting: Cardiovascular Disease

## 2018-03-05 ENCOUNTER — Encounter (INDEPENDENT_AMBULATORY_CARE_PROVIDER_SITE_OTHER): Payer: Self-pay

## 2018-03-05 ENCOUNTER — Other Ambulatory Visit: Payer: Self-pay | Admitting: Internal Medicine

## 2018-03-05 VITALS — BP 148/88 | HR 58 | Ht 73.0 in | Wt 243.4 lb

## 2018-03-05 DIAGNOSIS — E782 Mixed hyperlipidemia: Secondary | ICD-10-CM

## 2018-03-05 DIAGNOSIS — I2510 Atherosclerotic heart disease of native coronary artery without angina pectoris: Secondary | ICD-10-CM

## 2018-03-05 DIAGNOSIS — I110 Hypertensive heart disease with heart failure: Secondary | ICD-10-CM | POA: Diagnosis not present

## 2018-03-05 LAB — LIPID PANEL
CHOL/HDL RATIO: 2.6 ratio (ref 0.0–5.0)
Cholesterol, Total: 160 mg/dL (ref 100–199)
HDL: 62 mg/dL (ref >39)
LDL Calculated: 86 mg/dL (ref 0–99)
TRIGLYCERIDES: 60 mg/dL (ref 0–149)
VLDL Cholesterol Cal: 12 mg/dL (ref 5–40)

## 2018-03-05 LAB — BASIC METABOLIC PANEL
BUN/Creatinine Ratio: 16 (ref 10–24)
BUN: 21 mg/dL (ref 8–27)
CHLORIDE: 105 mmol/L (ref 96–106)
CO2: 24 mmol/L (ref 20–29)
Calcium: 9.2 mg/dL (ref 8.6–10.2)
Creatinine, Ser: 1.35 mg/dL — ABNORMAL HIGH (ref 0.76–1.27)
GFR calc Af Amer: 55 mL/min/{1.73_m2} — ABNORMAL LOW (ref >59)
GFR calc non Af Amer: 48 mL/min/{1.73_m2} — ABNORMAL LOW (ref >59)
Glucose: 107 mg/dL — ABNORMAL HIGH (ref 65–99)
Potassium: 4.2 mmol/L (ref 3.5–5.2)
Sodium: 146 mmol/L — ABNORMAL HIGH (ref 134–144)

## 2018-03-05 LAB — HEPATIC FUNCTION PANEL
ALBUMIN: 3.8 g/dL (ref 3.5–4.7)
ALT: 13 IU/L (ref 0–44)
AST: 21 IU/L (ref 0–40)
Alkaline Phosphatase: 82 IU/L (ref 39–117)
BILIRUBIN TOTAL: 0.3 mg/dL (ref 0.0–1.2)
BILIRUBIN, DIRECT: 0.12 mg/dL (ref 0.00–0.40)
TOTAL PROTEIN: 6.2 g/dL (ref 6.0–8.5)

## 2018-03-05 MED ORDER — LOSARTAN POTASSIUM 50 MG PO TABS: 50.0000 mg | ORAL_TABLET | Freq: Every day | ORAL | 11 refills | Status: DC

## 2018-03-05 MED ORDER — PANTOPRAZOLE SODIUM 40 MG PO TBEC: 40.0000 mg | DELAYED_RELEASE_TABLET | Freq: Two times a day (BID) | ORAL | 1 refills | Status: DC

## 2018-03-05 NOTE — Patient Instructions (Addendum)
Medication Instructions:  Your physician has recommended you make the following change in your medication:  START Losartan (Cozaar) 50 mg once daily  If you need a refill on your cardiac medications before your next appointment, please call your pharmacy.   Lab work: TODAY - cholesterol, liver panel, basic metabolic panel  Your physician recommends that you return for lab work in: 3 weeks for basic metabolic panel   Testing/Procedures: None Ordered   Follow-Up: At [facility], you and your health needs are our priority.  As part of our continuing mission to provide you with exceptional heart care, we have created designated Provider Care Teams.  These Care Teams include your primary Cardiologist (physician) and Advanced Practice Providers (APPs -  Physician Assistants and Nurse Practitioners) who all work together to provide you with the care you need, when you need it. You will need a follow up appointment in:  6 months.  Please call our office 2 months in advance to schedule this appointment.  You may see Mertie Moores, MD or one of the following Advanced Practice Providers on your designated Care Team: Richardson Dopp, PA-C [place], [place] . Daune Perch, NP

## 2018-03-05 NOTE — Telephone Encounter (Signed)
Brent [name] is at low risk from a cardiac standpoint for his upcoming hip surgery  He may hold his ASA for 5 days prior to surgery

## 2018-03-05 NOTE — Progress Notes (Signed)
Cardiology Office Note   Date:  03/05/2018   ID:  Brent [name], DOB 09-Sep-1931, MRN [id number]  PCP:  Maury Dus, MD  Cardiologist:   Mertie Moores, MD   No chief complaint on file.  1. CAD / CABG - 2000 2. HTN 3. Hyperlipidemia     Brent [name] is 85 y.o. gentleman with a hx of CAD/CABG, hyperlipidemia, HTN. He has not had any chest pain. He has been having lots of left hip pain. He had a steroid injection which helped quite a bit but it appears that the injection has now worn off.  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  Feb. 24, 2014:  He has been having some dyspnea with exercise. He has lots of orthopedic problems. He is exercising some some but not as much as he would like.  Nov. 6 ,2014:  Brent [name] is doing well. He recently had a laser treatment for an esophageal issue. No cardiac issues. Feeling well. Still working on a [name] . He has lost 10 lbs.   September 21, 2013:  Brent [name] is doing well. Has had a right shoulder replacement.  Feb. 17, 2016:  Brent [name] is a 85 y.o. male who presents for follow up of his dyspnea. He has been getting more short of breath over the past several weeks.  Was having DOE climbing up an incline.  He was seen by Nicki Reaper in clinic. He was started on Lasix and feels much better. He has decreased the lasix to twice a week. .  Follow-up Myoview was low risk .Overall Impression: Low risk stress nuclear study With no ischemia identified. Possible apical infarct pattern. Decreased sensitivity secondary to bowel loop attenuation..  LV Ejection Fraction: 51%. LV Wall Motion: Septal wall hypokinesis with otherwise normal LV function  Not having any chest pain   Jul 14, 2014:  Brent [name] is a 85 y.o. male who presents for follow up of his CAD  Having some bronchitis.  No CP . Still working on his antique car. Not exercising much   Jul 16, 2015  Doing well. No cardiac problems  Chronic back  problems. Has COPD   Oct. 5, 2017:  Has been having low energy.   BP was low.  Amlodipine has been held  BP is normal today   June 11, 2016:  BP has been good.   Jan. 16  2019      Doing well from a heart [place] Has become weaker  Has fallen ,  Had pneumonia last week .   Has been losing weight   Jul 07, 2017:   Brent [name] is seen back today for follow-up visit. Blood pressure is very elevated today-168/94.  BP was normal at Dr. Gustavus Bryant office yesterday  Is on prednisone 10 mg a day   He is been having more problems with shortness of breath.  He is having lots of wheezing.  He has trace ankle edema. His symptoms and his exam is really more consistent with pulmonary issues/COPD.  I do not think that he has significant heart failure.  Echocardiogram from 2018 reveals normal left ventricular systolic function with grade 1 diastolic dysfunction.  He saw our posterior about peripheral arterial disease.  He is concerned because he is having some leg discomfort.  March 05, 2018  Brent [name] is seen back today for follow-up of his coronary artery disease, hypertension, COPD.  He has a medium to large abdominal aortic aneurysm.    His AAA  is 5.2 cm  Needs left hip replacement  No CP - his DOE is stable  ( has COPD )    Past Medical History:  Diagnosis Date  . AAA (abdominal aortic aneurysm) ([place])   . Arthritis   . Back pain   . CAD (coronary artery disease)    Status post CABG  . COPD (chronic obstructive pulmonary disease) ([place])   . Dizziness    takes Antivert daily  . Erectile dysfunction   . GERD (gastroesophageal reflux disease)    takes Omeprazole daily  . History of blood clots    to the lungs   . HTN (hypertension)    takes Amlodipine and Micardis daily  . Hx of cardiovascular stress test    Lexiscan Myoview (2/16):  Possible apical infarct, no ischemia, EF 51%; Low Risk  . Hyperlipidemia    takes Crestor daily  . Myocardial infarction ([place])   . Pulmonary embolism  [facility])     Past Surgical History:  Procedure Laterality Date  . Broken Ankle    . Broken Jaw     20+ years ago  . CARDIOVASCULAR STRESS TEST  04-28-2005   EF 53%  . CORONARY ARTERY BYPASS GRAFT  1998   there are sequential 90% stenosis in the proximal LAD, and the mid and distal LAD is a moderate sized vessel. The mid and first diagonal branch has a 50-60% stenosis.   Marland Kitchen ESOPHAGOSCOPY  06/25/2011   Procedure: ESOPHAGOSCOPY;  Surgeon: Izora Gala, MD;  Location: [place];  Service: ENT;  Laterality: N/A;  Direct Esophagoscopy  . TONSILLECTOMY    . TOTAL SHOULDER ARTHROPLASTY Right 06/02/2013   Procedure: RIGHT TOTAL SHOULDER ARTHROPLASTY;  Surgeon: Marin Shutter, MD;  Location: [place];  Service: Orthopedics;  Laterality: Right;     Current Outpatient Medications  Medication Sig Dispense Refill  . albuterol (PROAIR HFA) 108 (90 Base) MCG/ACT inhaler Inhale 2 puffs into the lungs every 6 (six) hours as needed for wheezing or shortness of breath. 1 Inhaler 5  . Ascorbic Acid (VITAMIN C) 1000 MG tablet Take 1,000 mg by mouth daily.    Marland Kitchen aspirin EC 81 MG tablet Take 81 mg by mouth at bedtime.     . budesonide-formoterol (SYMBICORT) 80-4.5 MCG/ACT inhaler Inhale 2 puffs into the lungs 2 (two) times daily. 1 Inhaler 0  . carvedilol (COREG) 3.125 MG tablet TAKE 1 TABLET (3.125 MG) BY MOUTH TWICE DAILY 180 tablet 0  . cetirizine (ZYRTEC) 10 MG tablet Take 10 mg by mouth at bedtime.    . diphenhydrAMINE (ALLERGY) 25 MG tablet Take 25 mg by mouth as needed.    . DULoxetine (CYMBALTA) 60 MG capsule Take 60 mg by mouth daily.    . meclizine (ANTIVERT) 25 MG tablet Take 25 mg by mouth as needed.    . Multiple Vitamins-Minerals (MENS 50+ ADVANCED PO) Take 1 tablet by mouth daily.    . nitroGLYCERIN (NITROSTAT) 0.4 MG SL tablet PLACE 1 TABLET UNDER THE TONGUE EVERY 5 MINUTES AS NEEDED FOR CHEST PAIN UP TO 3 DOSES, IF SYMPTOMS PERSIST CALL 911 25 tablet 3  . pantoprazole (PROTONIX) 40 MG tablet TAKE 1 TABLET BY  MOUTH TWICE DAILY 60 tablet 1  . potassium chloride (K-DUR) 10 MEQ tablet Take 1 tablet (10 mEq total) by mouth daily. 90 tablet 3  . predniSONE (DELTASONE) 10 MG tablet Take one daily - take two if breathing worse, both at breakfast 100 tablet 2  . rosuvastatin (CRESTOR) 40 MG  tablet TAKE 1 TABLET BY MOUTH DAILY 30 tablet 5  . furosemide (LASIX) 40 MG tablet Take 1 tablet (40 mg total) by mouth daily. 90 tablet 3   No current facility-administered medications for this visit.     Allergies:   Morphine and Morphine and related    Social History:  The patient  reports that he quit smoking about 42 years ago. His smoking use included cigarettes. He has a 20.00 pack-year smoking history. He has never used smokeless tobacco. He reports current alcohol use of about 1.0 standard drinks of alcohol per week. He reports that he does not use drugs.   Family History:  The patient's family history includes Breast cancer in his mother; Heart attack in his mother; Stroke in his mother.    ROS: Noted in current history, all other systems are negative  Physical Exam: Blood pressure (!) 148/88, pulse (!) 58, height 6\' 1"  (1.854 m), weight 243 lb 6.4 oz (110.4 kg), SpO2 99 %.  GEN:  Well nourished, well developed in no acute distress HEENT: Normal NECK: No JVD; No carotid bruits LYMPHATICS: No lymphadenopathy CARDIAC: RRR , no significant murmur  RESPIRATORY:  Rales, especially in left base.  ABDOMEN: Soft, non-tender, non-distended MUSCULOSKELETAL:  No edema; No deformity  SKIN: Warm and dry NEUROLOGIC:  Alert and oriented x 3    EKG:    March 05, 2018: Sinus bradycardia at 58 beats minute.  First-degree AV block.  Nonspecific ST abnormalities.  Recent Labs: 07/06/2017: BUN 27; Creatinine, Ser 1.40; Hemoglobin 14.3; Platelets 291.0; Potassium 3.3; Pro B Natriuretic peptide (BNP) 121.0; Sodium 143; TSH 2.35    Lipid Panel    Component Value Date/Time   CHOL 159 06/11/2016 1100   TRIG 177  (H) 06/11/2016 1100   HDL 37 (L) 06/11/2016 1100   CHOLHDL 4.3 06/11/2016 1100   CHOLHDL 3.9 May 12, 202017 1553   VLDL 37 (H) May 12, 202017 1553   LDLCALC 87 06/11/2016 1100      Wt Readings from Last 3 Encounters:  03/05/18 243 lb 6.4 oz (110.4 kg)  01/26/18 242 lb (109.8 kg)  01/13/18 244 lb (110.7 kg)      Other studies Reviewed: Additional studies/ records that were reviewed today include: . Review of the above records demonstrates:    ASSESSMENT AND PLAN:  1. CAD / CABG -  No angina   2. HTN-    BP remains elevated.  He avoids salt.  He is not able to exercise much was suggested trying perhaps a stationary bike.  3. Hyperlipidemia -    check labs today.  4.   HX of chronic diastolic CHF:    grade 1 diastolic dysfunction.  He has rales particularly in his left base.  He does not have any other signs or symptoms of congestive heart failure.  This may be from chronic scarring or some degree of pulmonary fibrosis.  5. COPD -he has what sounds like chronic rales in his left base and slight rales in his right base.  I am not sure if this is related to his COPD or some chronic fibrosis. He did skip his Lasix today because he was coming to the office and I doubt that skipping 1 day of Lasix would result in pulmonary edema.  In addition, he really does not have any worsening symptoms and his O2 saturation is 99%. He will check back in with Dr. [name]     Current medicines are reviewed at length with the patient today.  The  patient does not have concerns regarding medicines.  The following changes have been made:  no change  Labs/ tests ordered today include:  No orders of the defined types were placed in this encounter.   Disposition:   FU with me in 6 months    Mertie Moores, MD  03/05/2018 8:21 AM    [facility] [address], [place], [place]  [address] Phone: [phone]; Fax: [phone]

## 2018-03-10 DIAGNOSIS — N5089 Other specified disorders of the male genital organs: Secondary | ICD-10-CM | POA: Diagnosis not present

## 2018-03-10 DIAGNOSIS — N4341 Spermatocele of epididymis, single: Secondary | ICD-10-CM | POA: Diagnosis not present

## 2018-03-10 DIAGNOSIS — N433 Hydrocele, unspecified: Secondary | ICD-10-CM | POA: Diagnosis not present

## 2018-03-22 DIAGNOSIS — M1612 Unilateral primary osteoarthritis, left hip: Secondary | ICD-10-CM | POA: Diagnosis not present

## 2018-03-26 ENCOUNTER — Other Ambulatory Visit: Payer: PPO | Admitting: *Deleted

## 2018-03-26 DIAGNOSIS — I110 Hypertensive heart disease with heart failure: Secondary | ICD-10-CM | POA: Diagnosis not present

## 2018-03-26 DIAGNOSIS — I2510 Atherosclerotic heart disease of native coronary artery without angina pectoris: Secondary | ICD-10-CM

## 2018-03-26 DIAGNOSIS — E782 Mixed hyperlipidemia: Secondary | ICD-10-CM | POA: Diagnosis not present

## 2018-03-27 LAB — BASIC METABOLIC PANEL
BUN / CREAT RATIO: 16 (ref 10–24)
BUN: 26 mg/dL (ref 8–27)
CO2: 26 mmol/L (ref 20–29)
Calcium: 9.5 mg/dL (ref 8.6–10.2)
Chloride: 100 mmol/L (ref 96–106)
Creatinine, Ser: 1.67 mg/dL — ABNORMAL HIGH (ref 0.76–1.27)
GFR, EST AFRICAN AMERICAN: 43 mL/min/{1.73_m2} — AB (ref >59)
GFR, EST NON AFRICAN AMERICAN: 37 mL/min/{1.73_m2} — AB (ref >59)
Glucose: 109 mg/dL — ABNORMAL HIGH (ref 65–99)
POTASSIUM: 4.5 mmol/L (ref 3.5–5.2)
SODIUM: 146 mmol/L — AB (ref 134–144)

## 2018-03-30 NOTE — Patient Instructions (Signed)
Brent [name]  03/30/2018   Your procedure is scheduled on: Wednesday 04/07/2018  Report to [facility] Main  Entrance              Report to admitting at 1010 AM    Call this number if you have problems the morning of surgery [phone]   Remember: Do not eat food or drink liquids :After Midnight.             BRUSH YOUR TEETH MORNING OF SURGERY AND RINSE YOUR MOUTH OUT, NO CHEWING GUM CANDY OR MINTS.     Take these medicines the morning of surgery with A SIP OF WATER: Carvedilol (Coreg), Duloxetine (Cymbalta), Pantoprazole (Protonix), use Symbicort inhaler and use Albuterol inhaler if needed and bring inhalers with you to the hospital.                                    You may not have any metal on your body including hair pins and              piercings  Do not wear jewelry, make-up, lotions, powders or perfumes, deodorant                         Men may shave face and neck.   Do not bring valuables to the hospital. [place].  Contacts, dentures or bridgework may not be worn into surgery.  Leave suitcase in the car. After surgery it may be brought to your room.                  Please read over the following fact sheets you were given: _____________________________________________________________________             [facility] - Preparing for Surgery Before surgery, you can play an important role.  Because skin is not sterile, your skin needs to be as free of germs as possible.  You can reduce the number of germs on your skin by washing with CHG (chlorahexidine gluconate) soap before surgery.  CHG is an antiseptic cleaner which kills germs and bonds with the skin to continue killing germs even after washing. Please DO NOT use if you have an allergy to CHG or antibacterial soaps.  If your skin becomes reddened/irritated stop using the CHG and inform your nurse when you arrive at Short Stay. Do  not shave (including legs and underarms) for at least 48 hours prior to the first CHG shower.  You may shave your face/neck. Please follow these instructions carefully:  1.  Shower with CHG Soap the night before surgery and the  morning of Surgery.  2.  If you choose to wash your hair, wash your hair first as usual with your  normal  shampoo.  3.  After you shampoo, rinse your hair and body thoroughly to remove the  shampoo.                           4.  Use CHG as you would any other liquid soap.  You can apply chg directly  to the skin and wash  Gently with a scrungie or clean washcloth.  5.  Apply the CHG Soap to your body ONLY FROM THE NECK DOWN.   Do not use on face/ open                           Wound or open sores. Avoid contact with eyes, ears mouth and genitals (private parts).                       Wash face,  Genitals (private parts) with your normal soap.             6.  Wash thoroughly, paying special attention to the area where your surgery  will be performed.  7.  Thoroughly rinse your body with warm water from the neck down.  8.  DO NOT shower/wash with your normal soap after using and rinsing off  the CHG Soap.                9.  Pat yourself dry with a clean towel.            10.  Wear clean pajamas.            11.  Place clean sheets on your bed the night of your first shower and do not  sleep with pets. Day of Surgery : Do not apply any lotions/deodorants the morning of surgery.  Please wear clean clothes to the hospital/surgery center.  FAILURE TO FOLLOW THESE INSTRUCTIONS MAY RESULT IN THE CANCELLATION OF YOUR SURGERY PATIENT SIGNATURE_________________________________  NURSE SIGNATURE__________________________________  ________________________________________________________________________   Brent [name]  An incentive spirometer is a tool that can help keep your lungs clear and active. This tool measures how well you are filling your  lungs with each breath. Taking long deep breaths may help reverse or decrease the chance of developing breathing (pulmonary) problems (especially infection) following:  A long period of time when you are unable to move or be active. BEFORE THE PROCEDURE   If the spirometer includes an indicator to show your best effort, your nurse or respiratory therapist will set it to a desired goal.  If possible, sit up straight or lean slightly forward. Try not to slouch.  Hold the incentive spirometer in an upright position. INSTRUCTIONS FOR USE  1. Sit on the edge of your bed if possible, or sit up as far as you can in bed or on a chair. 2. Hold the incentive spirometer in an upright position. 3. Breathe out normally. 4. Place the mouthpiece in your mouth and seal your lips tightly around it. 5. Breathe in slowly and as deeply as possible, raising the piston or the ball toward the top of the column. 6. Hold your breath for 3-5 seconds or for as long as possible. Allow the piston or ball to fall to the bottom of the column. 7. Remove the mouthpiece from your mouth and breathe out normally. 8. Rest for a few seconds and repeat Steps 1 through 7 at least 10 times every 1-2 hours when you are awake. Take your time and take a few normal breaths between deep breaths. 9. The spirometer may include an indicator to show your best effort. Use the indicator as a goal to work toward during each repetition. 10. After each set of 10 deep breaths, practice coughing to be sure your lungs are clear. If you have an incision (the cut made at the time of surgery),  support your incision when coughing by placing a pillow or rolled up towels firmly against it. Once you are able to get out of bed, walk around indoors and cough well. You may stop using the incentive spirometer when instructed by your caregiver.  RISKS AND COMPLICATIONS  Take your time so you do not get dizzy or light-headed.  If you are in pain, you may need  to take or ask for pain medication before doing incentive spirometry. It is harder to take a deep breath if you are having pain. AFTER USE  Rest and breathe slowly and easily.  It can be helpful to keep track of a log of your progress. Your caregiver can provide you with a simple table to help with this. If you are using the spirometer at home, follow these instructions: [place] IF:   You are having difficultly using the spirometer.  You have trouble using the spirometer as often as instructed.  Your pain medication is not giving enough relief while using the spirometer.  You develop fever of 100.5 F (38.1 C) or higher. SEEK IMMEDIATE MEDICAL CARE IF:   You cough up bloody sputum that had not been present before.  You develop fever of 102 F (38.9 C) or greater.  You develop worsening pain at or near the incision site. MAKE SURE YOU:   Understand these instructions.  Will watch your condition.  Will get help right away if you are not doing well or get worse. Document Released: 06/23/2006 Document Revised: 05/05/2011 Document Reviewed: 08/24/2006 ExitCare Patient Information 2014 ExitCare, [place].   ________________________________________________________________________  WHAT IS A BLOOD TRANSFUSION? Blood Transfusion Information  A transfusion is the replacement of blood or some of its parts. Blood is made up of multiple cells which provide different functions.  Red blood cells carry oxygen and are used for blood loss replacement.  White blood cells fight against infection.  Platelets control bleeding.  Plasma helps clot blood.  Other blood products are available for specialized needs, such as hemophilia or other clotting disorders. BEFORE THE TRANSFUSION  Who gives blood for transfusions?   Healthy volunteers who are fully evaluated to make sure their blood is safe. This is blood bank blood. Transfusion therapy is the safest it has ever been in the  practice of medicine. Before blood is taken from a donor, a complete history is taken to make sure that person has no history of diseases nor engages in risky social behavior (examples are intravenous drug use or sexual activity with multiple partners). The donor's travel history is screened to minimize risk of transmitting infections, such as malaria. The donated blood is tested for signs of infectious diseases, such as HIV and hepatitis. The blood is then tested to be sure it is compatible with you in order to minimize the chance of a transfusion reaction. If you or a relative donates blood, this is often done in anticipation of surgery and is not appropriate for emergency situations. It takes many days to process the donated blood. RISKS AND COMPLICATIONS Although transfusion therapy is very safe and saves many lives, the main dangers of transfusion include:   Getting an infectious disease.  Developing a transfusion reaction. This is an allergic reaction to something in the blood you were given. Every precaution is taken to prevent this. The decision to have a blood transfusion has been considered carefully by your caregiver before blood is given. Blood is not given unless the benefits outweigh the risks. AFTER THE TRANSFUSION  Right after receiving a blood transfusion, you will usually feel much better and more energetic. This is especially true if your red blood cells have gotten low (anemic). The transfusion raises the level of the red blood cells which carry oxygen, and this usually causes an energy increase.  The nurse administering the transfusion will monitor you carefully for complications. HOME CARE INSTRUCTIONS  No special instructions are needed after a transfusion. You may find your energy is better. Speak with your caregiver about any limitations on activity for underlying diseases you may have. SEEK MEDICAL CARE IF:   Your condition is not improving after your transfusion.  You  develop redness or irritation at the intravenous (IV) site. SEEK IMMEDIATE MEDICAL CARE IF:  Any of the following symptoms occur over the next 12 hours:  Shaking chills.  You have a temperature by mouth above 102 F (38.9 C), not controlled by medicine.  Chest, back, or muscle pain.  People around you feel you are not acting correctly or are confused.  Shortness of breath or difficulty breathing.  Dizziness and fainting.  You get a rash or develop hives.  You have a decrease in urine output.  Your urine turns a dark color or changes to pink, red, or brown. Any of the following symptoms occur over the next 10 days:  You have a temperature by mouth above 102 F (38.9 C), not controlled by medicine.  Shortness of breath.  Weakness after normal activity.  The white part of the eye turns yellow (jaundice).  You have a decrease in the amount of urine or are urinating less often.  Your urine turns a dark color or changes to pink, red, or brown. Document Released: 02/08/2000 Document Revised: 05/05/2011 Document Reviewed: 09/27/2007 [facility] Patient Information 2014 [place], [place].  _______________________________________________________________________

## 2018-03-30 NOTE — Progress Notes (Signed)
03/05/2018- noted in Epic-Office note from Dr. Acie Fredrickson  01/26/2018-  Noted in Epic-VAS [place] AA Duplex limited   01/13/2018- Neurology Clearance on chart from Dr. Arlice Colt  11/17/2017- noted in [place] note from Dr. Christinia Gully  07/22/2017-noted in [place] chest high resolution  07/06/2017- noted in Epic-CXR

## 2018-03-31 ENCOUNTER — Encounter (HOSPITAL_COMMUNITY): Payer: Self-pay

## 2018-03-31 ENCOUNTER — Other Ambulatory Visit: Payer: Self-pay

## 2018-03-31 ENCOUNTER — Encounter (HOSPITAL_COMMUNITY)
Admission: RE | Admit: 2018-03-31 | Discharge: 2018-03-31 | Disposition: A | Discharge status: Home / Self Care | Payer: PPO | Source: Ambulatory Visit | Attending: Orthopedic Surgery | Admitting: Orthopedic Surgery

## 2018-03-31 ENCOUNTER — Inpatient Hospital Stay (HOSPITAL_COMMUNITY): Admission: RE | Admit: 2018-03-31 | Payer: PPO | Source: Ambulatory Visit

## 2018-03-31 DIAGNOSIS — R9431 Abnormal electrocardiogram [ECG] [EKG]: Secondary | ICD-10-CM | POA: Insufficient documentation

## 2018-03-31 DIAGNOSIS — Z01818 Encounter for other preprocedural examination: Secondary | ICD-10-CM | POA: Diagnosis not present

## 2018-03-31 DIAGNOSIS — M1612 Unilateral primary osteoarthritis, left hip: Secondary | ICD-10-CM | POA: Diagnosis not present

## 2018-03-31 DIAGNOSIS — I10 Essential (primary) hypertension: Secondary | ICD-10-CM | POA: Diagnosis not present

## 2018-03-31 HISTORY — DX: Adverse effect of unspecified anesthetic, initial encounter: T41.45XA

## 2018-03-31 HISTORY — DX: Pneumonia, unspecified organism: J18.9

## 2018-03-31 HISTORY — DX: Other complications of anesthesia, initial encounter: T88.59XA

## 2018-03-31 HISTORY — DX: Dyspnea, unspecified: R06.00

## 2018-03-31 LAB — CBC WITH DIFFERENTIAL/PLATELET
Abs Immature Granulocytes: 0.02 10*3/uL (ref 0.00–0.07)
Basophils Absolute: 0 10*3/uL (ref 0.0–0.1)
Basophils Relative: 0 %
Eosinophils Absolute: 0.1 10*3/uL (ref 0.0–0.5)
Eosinophils Relative: 1 %
HCT: 44.7 % (ref 39.0–52.0)
Hemoglobin: 13.5 g/dL (ref 13.0–17.0)
Immature Granulocytes: 0 %
Lymphocytes Relative: 17 %
Lymphs Abs: 1.3 10*3/uL (ref 0.7–4.0)
MCH: 30.7 pg (ref 26.0–34.0)
MCHC: 30.2 g/dL (ref 30.0–36.0)
MCV: 101.6 fL — ABNORMAL HIGH (ref 80.0–100.0)
Monocytes Absolute: 0.4 10*3/uL (ref 0.1–1.0)
Monocytes Relative: 5 %
Neutro Abs: 6.1 10*3/uL (ref 1.7–7.7)
Neutrophils Relative %: 77 %
Platelets: 200 10*3/uL (ref 150–400)
RBC: 4.4 MIL/uL (ref 4.22–5.81)
RDW: 13.4 % (ref 11.5–15.5)
WBC: 8 10*3/uL (ref 4.0–10.5)
nRBC: 0 % (ref 0.0–0.2)

## 2018-03-31 LAB — COMPREHENSIVE METABOLIC PANEL
ALT: 20 U/L (ref 0–44)
AST: 28 U/L (ref 15–41)
Albumin: 3.8 g/dL (ref 3.5–5.0)
Alkaline Phosphatase: 64 U/L (ref 38–126)
Anion gap: 8 (ref 5–15)
BUN: 32 mg/dL — ABNORMAL HIGH (ref 8–23)
CO2: 29 mmol/L (ref 22–32)
Calcium: 8.8 mg/dL — ABNORMAL LOW (ref 8.9–10.3)
Chloride: 107 mmol/L (ref 98–111)
Creatinine, Ser: 1.62 mg/dL — ABNORMAL HIGH (ref 0.61–1.24)
GFR calc Af Amer: 45 mL/min — ABNORMAL LOW (ref >60)
GFR calc non Af Amer: 38 mL/min — ABNORMAL LOW (ref >60)
Glucose, Bld: 126 mg/dL — ABNORMAL HIGH (ref 70–99)
Potassium: 4.4 mmol/L (ref 3.5–5.1)
Sodium: 144 mmol/L (ref 135–145)
Total Bilirubin: 0.3 mg/dL (ref 0.3–1.2)
Total Protein: 7.2 g/dL (ref 6.5–8.1)

## 2018-03-31 LAB — SURGICAL PCR SCREEN
MRSA, PCR: NEGATIVE
Staphylococcus aureus: NEGATIVE

## 2018-03-31 LAB — APTT: aPTT: 25 seconds (ref 24–36)

## 2018-03-31 LAB — PROTIME-INR
INR: 1.06
Prothrombin Time: 13.7 seconds (ref 11.4–15.2)

## 2018-03-31 NOTE — H&P (Signed)
TOTAL HIP ADMISSION H&P  Patient is admitted for left total hip arthroplasty.  Subjective:  Chief Complaint: left hip pain  HPI: Brent [name], 85 y.o. male, has a history of pain and functional disability in the left hip(s) due to arthritis and patient has failed non-surgical conservative treatments for greater than 12 weeks to include corticosteriod injections and activity modification.  Onset of symptoms was gradual starting several years ago with gradually worsening course since that time.The patient noted no past surgery on the left hip(s).  Patient currently rates pain in the left hip at 7 out of 10 with activity. Patient has worsening of pain with activity and weight bearing, pain that interfers with activities of daily living and instability. Patient has evidence of bone-on-bone arthritis with marginal osteophyte formation and subchondral cysts by imaging studies. This condition presents safety issues increasing the risk of falls. There is no current active infection.  Patient Active Problem List   Diagnosis Date Noted  . Hip arthritis 01/13/2018  . Diuretic-induced hypokalemia 07/08/2017  . Numbness 03/17/2017  . Gait disturbance 02/25/2017  . Neck pain 02/25/2017  . Falls 02/25/2017  . COPD GOLD III  12/04/2014  . Postinflammatory pulmonary fibrosis ([place]) 09/12/2014  . Upper airway cough syndrome 09/12/2014  . Dyspnea on exertion 04/12/2014  . S/P shoulder replacement 06/02/2013  . CAD (coronary artery disease) 03/17/2011  . Hyperlipidemia 03/17/2011  . Essential hypertension 03/17/2011   Past Medical History:  Diagnosis Date  . AAA (abdominal aortic aneurysm) ([place])   . Arthritis   . Back pain   . CAD (coronary artery disease)    Status post CABG  . COPD (chronic obstructive pulmonary disease) ([place])   . Dizziness    takes Antivert daily  . Erectile dysfunction   . GERD (gastroesophageal reflux disease)    takes Omeprazole daily  . History of blood clots    to the  lungs   . HTN (hypertension)    takes Amlodipine and Micardis daily  . Hx of cardiovascular stress test    Lexiscan Myoview (2/16):  Possible apical infarct, no ischemia, EF 51%; Low Risk  . Hyperlipidemia    takes Crestor daily  . Myocardial infarction ([place])   . Pulmonary embolism [facility])     Past Surgical History:  Procedure Laterality Date  . Broken Ankle    . Broken Jaw     20+ years ago  . CARDIOVASCULAR STRESS TEST  04-28-2005   EF 53%  . CORONARY ARTERY BYPASS GRAFT  1998   there are sequential 90% stenosis in the proximal LAD, and the mid and distal LAD is a moderate sized vessel. The mid and first diagonal branch has a 50-60% stenosis.   Marland Kitchen ESOPHAGOSCOPY  06/25/2011   Procedure: ESOPHAGOSCOPY;  Surgeon: Izora Gala, MD;  Location: [place];  Service: ENT;  Laterality: N/A;  Direct Esophagoscopy  . TONSILLECTOMY    . TOTAL SHOULDER ARTHROPLASTY Right 06/02/2013   Procedure: RIGHT TOTAL SHOULDER ARTHROPLASTY;  Surgeon: Marin Shutter, MD;  Location: [place];  Service: Orthopedics;  Laterality: Right;    No current facility-administered medications for this encounter.    Current Outpatient Medications  Medication Sig Dispense Refill Last Dose  . albuterol (PROAIR HFA) 108 (90 Base) MCG/ACT inhaler Inhale 2 puffs into the lungs every 6 (six) hours as needed for wheezing or shortness of breath. 1 Inhaler 5 Taking  . Ascorbic Acid (VITAMIN C) 1000 MG tablet Take 1,000 mg by mouth daily.   Taking  .  aspirin EC 81 MG tablet Take 81 mg by mouth at bedtime.    Taking  . budesonide-formoterol (SYMBICORT) 80-4.5 MCG/ACT inhaler Inhale 2 puffs into the lungs 2 (two) times daily. 1 Inhaler 0 Taking  . carvedilol (COREG) 3.125 MG tablet TAKE 1 TABLET (3.125 MG) BY MOUTH TWICE DAILY 180 tablet 0 Taking  . cetirizine (ZYRTEC) 10 MG tablet Take 10 mg by mouth at bedtime.   Taking  . DULoxetine (CYMBALTA) 60 MG capsule Take 60 mg by mouth daily.   Taking  . furosemide (LASIX) 40 MG tablet Take 1 tablet  (40 mg total) by mouth daily. 90 tablet 3 Taking  . losartan (COZAAR) 50 MG tablet Take 1 tablet (50 mg total) by mouth daily. 30 tablet 11   . Multiple Vitamin (MULTIVITAMIN WITH MINERALS) TABS tablet Take 1 tablet by mouth daily.     . nitroGLYCERIN (NITROSTAT) 0.4 MG SL tablet PLACE 1 TABLET UNDER THE TONGUE EVERY 5 MINUTES AS NEEDED FOR CHEST PAIN UP TO 3 DOSES, IF SYMPTOMS PERSIST CALL 911 25 tablet 3 Taking  . pantoprazole (PROTONIX) 40 MG tablet Take 1 tablet (40 mg total) by mouth 2 (two) times daily. 60 tablet 1   . potassium chloride (K-DUR) 10 MEQ tablet Take 1 tablet (10 mEq total) by mouth daily. 90 tablet 3 Taking  . predniSONE (DELTASONE) 10 MG tablet Take one daily - take two if breathing worse, both at breakfast (Patient taking differently: Take 10 mg by mouth See admin instructions. Take one daily - take two if breathing worse, both at breakfast) 100 tablet 2 Taking  . rosuvastatin (CRESTOR) 40 MG tablet TAKE 1 TABLET BY MOUTH DAILY (Patient taking differently: Take 40 mg by mouth every evening. ) 30 tablet 5 Taking   Allergies  Allergen Reactions  . Morphine Itching and Hives    Social History   Tobacco Use  . Smoking status: Former Smoker    Packs/day: 1.00    Years: 20.00    Pack years: 20.00    Types: Cigarettes    Last attempt to quit: 02/25/1976    Years since quitting: 42.1  . Smokeless tobacco: Never Used  Substance Use Topics  . Alcohol use: Yes    Alcohol/week: 1.0 standard drinks    Types: 1 Glasses of wine per week    Comment: 1-2 beers per week    Family History  Problem Relation Age of Onset  . Heart attack Mother   . Stroke Mother   . Breast cancer Mother      Review of Systems  Constitutional: Negative for chills and fever.  HENT: Negative for congestion, sore throat and tinnitus.   Eyes: Negative for double vision, photophobia and pain.  Respiratory: Negative for cough, shortness of breath and wheezing.   Cardiovascular: Negative for chest  pain, palpitations and orthopnea.  Gastrointestinal: Negative for heartburn, nausea and vomiting.  Genitourinary: Negative for dysuria, frequency and urgency.  Musculoskeletal: Positive for joint pain.  Neurological: Negative for dizziness, weakness and headaches.    Objective:  Physical Exam  Well nourished and well developed.  General: Alert and oriented x3, cooperative and pleasant, no acute distress.  Head: normocephalic, atraumatic, neck supple.  Eyes: EOMI.  Respiratory: breath sounds clear in all fields, no wheezing, rales, or rhonchi. Cardiovascular: Regular rate and rhythm, no murmurs, gallops or rubs.  Abdomen: non-tender to palpation and soft, normoactive bowel sounds. Musculoskeletal:  Left Hip Exam: ROM: Flexion to 90, Internal Rotation 10, External Rotation 30, and  Abduction 30 with pain and discomfort. There is no tenderness over the greater trochanter bursa.  Calves soft and nontender. Motor function intact in LE. Strength 5/5 LE bilaterally. Neuro: Distal pulses 2+. Sensation to light touch intact in LE.  Vital signs in last 24 hours: Blood pressure: 144/94 mmHg  Labs:   Estimated body mass index is 32.11 kg/m as calculated from the following:   Height as of 03/05/18: 6\' 1"  (1.854 m).   Weight as of 03/05/18: 110.4 kg.   Imaging Review Plain radiographs demonstrate severe degenerative joint disease of the left hip(s). The bone quality appears to be adequate for age and reported activity level.    Preoperative templating of the joint replacement has been completed, documented, and submitted to the Operating Room personnel in order to optimize intra-operative equipment management.     Assessment/Plan:  End stage arthritis, left hip(s)  The patient history, physical examination, clinical judgement of the provider and imaging studies are consistent with end stage degenerative joint disease of the left hip(s) and total hip arthroplasty is deemed medically  necessary. The treatment options including medical management, injection therapy, arthroscopy and arthroplasty were discussed at length. The risks and benefits of total hip arthroplasty were presented and reviewed. The risks due to aseptic loosening, infection, stiffness, dislocation/subluxation,  thromboembolic complications and other imponderables were discussed.  The patient acknowledged the explanation, agreed to proceed with the plan and consent was signed. Patient is being admitted for inpatient treatment for surgery, pain control, PT, OT, prophylactic antibiotics, VTE prophylaxis, progressive ambulation and ADL's and discharge planning.The patient is planning to be discharged home.   Therapy Plans: HEP Disposition: Home with wife Planned DVT Prophylaxis: Xarelto 10 mg daily (hx PE) DME needed: None PCP: Maury Dus, MD Cardiologist: Mertie Moores, MD Neurologist: Weldon Picking, MD TXA: Topical (hx PE) Allergies: Morphine (hives) Anesthesia Concerns: None BMI: 30.5  - Patient was instructed on what medications to stop prior to surgery. - Follow-up visit in 2 weeks with Dr. Wynelle Link - Begin physical therapy following surgery - Pre-operative lab work as pre-surgical testing - Prescriptions will be provided in hospital at time of discharge  Theresa Duty, PA-C Orthopedic Surgery [facility] [place]

## 2018-04-01 ENCOUNTER — Ambulatory Visit (INDEPENDENT_AMBULATORY_CARE_PROVIDER_SITE_OTHER): Payer: PPO | Admitting: Internal Medicine

## 2018-04-01 ENCOUNTER — Other Ambulatory Visit (HOSPITAL_COMMUNITY): Payer: Self-pay | Admitting: *Deleted

## 2018-04-01 ENCOUNTER — Encounter: Payer: Self-pay | Admitting: Internal Medicine

## 2018-04-01 VITALS — BP 128/72 | HR 60 | Ht 72.75 in | Wt 241.0 lb

## 2018-04-01 DIAGNOSIS — J8410 Pulmonary fibrosis, unspecified: Secondary | ICD-10-CM

## 2018-04-01 DIAGNOSIS — J449 Chronic obstructive pulmonary disease, unspecified: Secondary | ICD-10-CM

## 2018-04-01 DIAGNOSIS — L820 Inflamed seborrheic keratosis: Secondary | ICD-10-CM | POA: Diagnosis not present

## 2018-04-01 DIAGNOSIS — L821 Other seborrheic keratosis: Secondary | ICD-10-CM | POA: Diagnosis not present

## 2018-04-01 DIAGNOSIS — D0439 Carcinoma in situ of skin of other parts of face: Secondary | ICD-10-CM | POA: Diagnosis not present

## 2018-04-01 DIAGNOSIS — C44321 Squamous cell carcinoma of skin of nose: Secondary | ICD-10-CM | POA: Diagnosis not present

## 2018-04-01 DIAGNOSIS — C44329 Squamous cell carcinoma of skin of other parts of face: Secondary | ICD-10-CM | POA: Diagnosis not present

## 2018-04-01 LAB — URINALYSIS, ROUTINE W REFLEX MICROSCOPIC
Bilirubin Urine: NEGATIVE
Glucose, UA: NEGATIVE mg/dL
Hgb urine dipstick: NEGATIVE
Ketones, ur: NEGATIVE mg/dL
Leukocytes, UA: NEGATIVE
Nitrite: NEGATIVE
Protein, ur: NEGATIVE mg/dL
Specific Gravity, Urine: 1.013 (ref 1.005–1.030)
pH: 6 (ref 5.0–8.0)

## 2018-04-01 LAB — ABO/RH: ABO/RH(D): A NEG

## 2018-04-01 NOTE — Patient Instructions (Signed)
Key to recovery from hip surgery is early mobilization and minimize the pain medication    You are cleared for hip surgery - our team can see you as needed   Keep your previous appt

## 2018-04-01 NOTE — Progress Notes (Signed)
Subjective:    Patient ID: Brent [name], male    DOB: 1931/08/04,    MRN: [id number]    Brief patient profile:  68  yowm quit smoking in 1978 with cabg late 1990's  No trouble breathing until around summer 2015 then cough onset x 05/2014 with neg cardiac w/u by Brent [name]> referred 09/11/2014  by Dr Brent [name] with PF on CT 08/23/14 and GOLD III critieria for copd       History of Present Illness  09/11/2014 1st [facility] office visit/ Brent [name]   Chief Complaint  Patient presents with  . Pulmonary Consult    Referred by Dr. Maury [name]. Pt c/o cough, wheezing and SOB for the past 2 months. He states he gets SOB with walking 100 yrds. His cough is mainly non prod but will occ produce some clear sputum.   dyspnea first symptom he noted  x one year just with exertion indolent onset / minimally progressive then coughing x 2-3 months assoc with hb  And since onset of cough having more sob.   Cough not better with saba/ some better p pred/breo rec Try prilosec otc 20mg  x 2  Take 30-60 min before first meal of the day and Pepcid ac (famotidine) 20 mg one @  bedtime until return (over the counter) GERD diet     01/05/2017  f/u ov/Brent [name] re:  GOLD III copd/ PF  Chief Complaint  Patient presents with  . Follow-up    Breathing is overall doing well. He is coughing some with white sputum. He states he was txed for bronchitis by his PCP recently.   already finished zpak, / prednisone  Improved/ chronically not taking ppi consistently  Doe back to [facility] =1  rec Plan A = Automatic =   Symbicort  160 Take 2 puffs first thing in am and then another 2 puffs about 12 hours later.  Work on inhaler technique:   Plan B = Backup Only use your albuterol as a rescue medication     NP  05/11/17 Continue on current regimen Follow med calendar closely and bring to each visit      07/06/2017  f/u ov/Brent [name] re:  Copd III/ pf - no med calendar on pred 10 mg daily  Chief Complaint  Patient presents with  .  Acute Visit    Increased SOB for the past 2 wks. He states that he gets SOB when he lies down and with exertion such as walking short distances and up stairs.   Dyspnea:  More sob esp steps x 2 weeks, indolent onset  Cough: none Sleep: L side down / horizontal otherwise sob supine  SABA use:  Has not tried saba  inhalers to see if they help rec See calendar for specific medication instructions and bring it back    08/18/2017  f/u ov/Brent [name] re:  COPD III/ pf no med cal on pred 10 mg bid (misunderstood rec for 2 q am)  - no med calendar  Chief Complaint  Patient presents with  . Follow-up    Breathing is "100% better". He has been taking pred 10 mg twice daily.  He has not had to use his albuterol inhaler.   Dyspnea:  Steps better / [employer] nl pace Cough: none  SABA use: none 02: none   rec Prednisone 10 mg one on odd and two on even x 2 weeks then try try just one daily an if get worse start back at 20 mg each am  11/17/2017  f/u ov/Brent [name] re:  Copd III/ pf misunderstood instructions and still on pred 20/10  Chief Complaint  Patient presents with  . Follow-up    Breathing has been doing well.  He states he rarely uses his albuterol.    Dyspnea:  Still able to wm at nl pace - no change on days with 20 vs 10 prednisone Cough: none Sleeping: bed blocks x 5 in / 1 pillow, lies on L side due to gerd on R side  SABA use: rarely rec Reduce the prednisone 10 mg one each am with breakfast - if worse go back to previous dose  GERD diet Please schedule a follow up visit in 6  months but call sooner if needed with pfts on return     04/01/2018  Preop consult o/Brent [name] re: PF/ copd GOLD III  Prednisone 10 mg daily  Chief Complaint  Patient presents with  . Shortness of Breath    Worse in the last month.   Dyspnea:  L hip slows him down > sob Cough: no Sleeping: bed blocks  Around 30 degree/ fine SABA use: rarely 02: no    No obvious day to day or daytime variability or assoc excess/  purulent sputum or mucus plugs or hemoptysis or cp or chest tightness, subjective wheeze or overt sinus or hb symptoms.   Sleeping as above without nocturnal  or early am exacerbation  of respiratory  c/o's or need for noct saba. Also denies any obvious fluctuation of symptoms with weather or environmental changes or other aggravating or alleviating factors except as outlined above   No unusual exposure hx or h/o childhood pna/ asthma or knowledge of premature birth.  Current Allergies, Complete Past Medical History, Past Surgical History, Family History, and Social History were reviewed in [employer] record.  ROS  The following are not active complaints unless bolded Hoarseness, sore throat, dysphagia, dental problems, itching, sneezing,  nasal congestion or discharge of excess mucus or purulent secretions, ear ache,   fever, chills, sweats, unintended wt loss or wt gain, classically pleuritic or exertional cp,  orthopnea pnd or arm/hand swelling  or leg swelling, presyncope, palpitations, abdominal pain, anorexia, nausea, vomiting, diarrhea  or change in bowel habits or change in bladder habits, change in stools or change in urine, dysuria, hematuria,  rash, arthralgias, visual complaints, headache, numbness, weakness or ataxia or problems with walking or coordination,  change in mood or  memory.        Current Meds  Medication Sig  . albuterol (PROAIR HFA) 108 (90 Base) MCG/ACT inhaler Inhale 2 puffs into the lungs every 6 (six) hours as needed for wheezing or shortness of breath.  . Ascorbic Acid (VITAMIN C) 1000 MG tablet Take 1,000 mg by mouth daily.  Marland Kitchen aspirin EC 81 MG tablet Take 81 mg by mouth at bedtime.   . budesonide-formoterol (SYMBICORT) 80-4.5 MCG/ACT inhaler Inhale 2 puffs into the lungs 2 (two) times daily.  . carvedilol (COREG) 3.125 MG tablet TAKE 1 TABLET (3.125 MG) BY MOUTH TWICE DAILY  . cetirizine (ZYRTEC) 10 MG tablet Take 10 mg by mouth at bedtime.    . DULoxetine (CYMBALTA) 60 MG capsule Take 60 mg by mouth daily.  . furosemide (LASIX) 40 MG tablet Take 1 tablet (40 mg total) by mouth daily.  Marland Kitchen losartan (COZAAR) 50 MG tablet Take 1 tablet (50 mg total) by mouth daily.  . Multiple Vitamin (MULTIVITAMIN WITH MINERALS) TABS tablet Take 1 tablet by mouth  daily.  . nitroGLYCERIN (NITROSTAT) 0.4 MG SL tablet PLACE 1 TABLET UNDER THE TONGUE EVERY 5 MINUTES AS NEEDED FOR CHEST PAIN UP TO 3 DOSES, IF SYMPTOMS PERSIST CALL 911  . pantoprazole (PROTONIX) 40 MG tablet Take 1 tablet (40 mg total) by mouth 2 (two) times daily.  . potassium chloride (K-DUR) 10 MEQ tablet Take 1 tablet (10 mEq total) by mouth daily.  . predniSONE (DELTASONE) 10 MG tablet Take one daily - take two if breathing worse, both at breakfast (Patient taking differently: Take 10 mg by mouth See admin instructions. Take one daily - take two if breathing worse, both at breakfast)  . rosuvastatin (CRESTOR) 40 MG tablet TAKE 1 TABLET BY MOUTH DAILY (Patient taking differently: Take 40 mg by mouth every evening. )                        Objective:   Physical Exam   amb obese wm nad  Vital signs reviewed - Note on arrival 02 sats  94% on RA     10/23/2014       250 >248 11/14/2014 > 12/04/2014    248  > 04/23/2015   261 > 07/24/2015  258 > 09/29/2016   249 > 01/05/2017  240 > 01/05/2017   240 > 04/13/2017   234 > 07/06/2017  233 > 08/18/2017  236 > 11/17/2017 242 > 04/01/2018  241      HEENT: nl dentition, turbinates bilaterally, and oropharynx. Nl external ear canals without cough reflex - Modified Mallampati Score =   1   NECK :  without JVD/Nodes/TM/ nl carotid upstrokes bilaterally   LUNGS: no acc muscle use,  Nl contour chest with coarse insp crackles bases bilaterally without cough on insp or exp maneuvers   CV:  RRR  no s3 or murmur or increase in P2, and  Trace ankle edema sym bilaterally ABD:  soft and nontender with nl inspiratory excursion in the supine position. No  bruits or organomegaly appreciated, bowel sounds nl  MS:  Slow Gait with cane/ ext warm without deformities, calf tenderness, cyanosis - yellow nails, no def clubbing    SKIN: warm and dry without lesions    NEURO:  alert, approp, nl sensorium with  no motor or cerebellar deficits apparent.                      I personally reviewed images and agree with radiology impression as follows:   Chest HRCT  07/22/17 1. Spectrum of findings compatible with fibrotic interstitial lung disease with mild-to-moderate honeycombing. Slight basilar predominance. Mild progression since 2016. Findings are considered diagnostic of usual interstitial pneumonia (UIP).          Assessment & Plan:

## 2018-04-02 ENCOUNTER — Encounter: Payer: Self-pay | Admitting: Internal Medicine

## 2018-04-02 NOTE — Assessment & Plan Note (Signed)
Symptom onset summer 2015 - HRCT 08/23/14  1. Pulmonary parenchymal pattern of fibrosis is likely due to usual interstitial pneumonitis (UIP). 2. Enlarged pulmonary arteries, indicative of pulmonary arterial Hypertension. -09/11/2014  Walked RA x 3 laps @ 185 ft each stopped due to End of study, nl pace, no sob or desat  - PFT's  12/04/2014  FEV1 1.39 (43 % ) ratio 64  p no % improvement from saba with DLCO  53 % corrects to 111 % for alv volume  And ERV 20 - 04/23/2015  Walked RA x 3 laps @ 185 ft each stopped due to  End of study, nl pace, no sob or desat   - 04/13/2017 improved on prednisone 04/13/2017 but ESR 61 so rec try taper to pred 10 mg daily and hold there=  [facility] RX with PREDNISONE  - 07/06/2017  ESR 39 on pred 28m daily  - HRCT 07/22/17 : Spectrum of findings compatible with fibrotic interstitial lung disease with mild-to-moderate honeycombing. Slight basilar predominance. Mild progression since 2016. Findings are considered diagnostic of usual interstitial pneumonia (UIP). - 08/18/2017  Walked RA x 3 laps @ 185 ft each stopped due to  End of study, nl pace, no sob or desat  - 08/18/2017 rec prednisone reduce to 10 mg daily > misunderstood and only did 20/10  - 04/01/2018 no change doe on pred 10 mg daily     Although he has uip on HRCT he has clinically done much better since starting prednisone and so far no flare of cough or sob with taper   The goal with a chronic steroid dependent illness is always arriving at the lowest effective dose that controls the disease/symptoms and not accepting a set "formula" which is based on statistics or guidelines that don't always take into account patient  variability or the natural hx of the dz in every individual patient, which may well vary over time.  For now therefore I recommend the patient maintain  10 mg floor though will need to be covered with IV hydrocortisone periop per anesthesia proctocol as probably has poor adrenal reserve.

## 2018-04-02 NOTE — Assessment & Plan Note (Signed)
Quit smoking 1978 PFT's 12/04/2014 mostly restrictive but ratio 64% so quite mild relative to PF component  - 12/04/2014   75% so try symbicort 160 2bid  - 04/23/2015  extensive coaching HFA effectiveness =    90% from a baseline of 75%  - 07/24/2015 try off symbicort at pt's request > can take up to 2 pffs q 12h prn  - PFT's  04/13/2017  FEV1 1.89 (60 % ) ratio 83  p 7 % improvement from saba p symb 160 2 prior to study with DLCO  39 % corrects to 81 % for alv volume   - 04/13/2017   symb 80 2bid and taper prednisone to 10 mg per day and hold there = [facility] RX with PRED   - 04/01/2018  After extensive coaching inhaler device,  effectiveness =    90%  Adequate control on present rx, reviewed in detail with pt > no change in rx needed  - may benefit from duoneb prn peri op   He has an increased risk for post op resp complications but has had progressive decline in mobility due to due the L hip and the risks for non-thoracic surgery are not "hard lines" in terms of any objective level of FEV1 or FVC or dlco for that matter so rec proceed with hip surgery as planned with early mobilization/ min sedation/  pulmonary f/u prn as inpt/ no change in meds needed.   I had an extended discussion with the patient reviewing all relevant studies completed to date and  lasting 25 minutes of a 40  minute pre op consultation addressing   Both copd and ild.   See device teaching which extended face to face time for this visit    Each maintenance medication was reviewed in detail including most importantly the difference between maintenance and prns and under what circumstances the prns are to be triggered using an action plan format that is not reflected in the computer generated alphabetically organized AVS.    Please see AVS for specific instructions unique to this office visit that I personally wrote and verbalized to the the pt in detail and then reviewed with pt  by my nurse highlighting any changes in  therapy/plan of care  recommended at today's visit.

## 2018-04-02 NOTE — Progress Notes (Signed)
Anesthesia Chart Review   Case:  [id number] Date/Time:  04/07/18 1225   Procedure:  TOTAL HIP ARTHROPLASTY ANTERIOR APPROACH (Left ) - 178min   Anesthesia type:  Choice   Pre-op diagnosis:  left hip osteoarthritis   Location:  [facility] ROOM 09 / [facility]   Surgeon:  Gaynelle Arabian, MD      DISCUSSION: 85 yo former smoker (20 pack years, quit 02/25/76) with h/o CAD (h/o MI, CABG), GERD, renal insufficiency (creatinine at PST 03/31/18 1.62 which appears to be baseline), gait disturbance, HTN, HLD, PE, AAA, COPD, left hip OA scheduled for above surgery with Dr. Gaynelle Arabian on 04/07/18.   Pt last seen by pulmonologist, Dr. Christinia Gully, on 04/01/2018.  Per his note, "Adequate control on present rx, reviewed in detail with pt > no change in rx needed  - may benefit from duoneb prn peri op.  He has an increased risk for post op resp complications but has had progressive decline in mobility due to due the L hip and the risks for non-thoracic surgery are not "hard lines" in terms of any objective level of FEV1 or FVC or dlco for that matter so rec proceed with hip surgery as planned with early mobilization/ min sedation/  pulmonary f/u prn as inpt/ no change in meds needed.  For now therefore I recommend the patient maintain 10 mg floor though will need to be covered with IV hydrocortisone periop per anesthesia proctocol as probably has poor adrenal reserve."  Pt last seen by cardiologist, Dr. Acie Fredrickson, on 03/05/2018. Per Dr. Acie Fredrickson, "Kaveh is at low risk from a cardiac standpoint for his upcoming hip surgery.  He may hold his ASA for 5 days prior to surgery"  Asymptomatic AAA 5.1cm on ultrasound 01/26/18.  Followed by Dr. Curt Jews.  Repeat CT abdomen pelvis in 6 months. Dr. Donnetta Hutching aware of upcoming surgery.    Pt followed by Dr. Arlice Colt with neurology for gait disturbance, last seen 01/13/18.  Per this office visit, "He is neurologically cleared to undergo hip replacement.  He is on aspirin, prescribed by his  cardiologist (he has had CABG in the past) recommendations on duration of time off aspirin could be made by his cardiologist."  Pt can proceed with planned procedure barring acute status change.  VS: BP (!) 164/80   Pulse 72   Temp 36.4 C (Oral)   Resp 20   Ht 6\' 1"  (1.854 m)   Wt 108.4 kg   SpO2 99%   BMI 31.53 kg/m   PROVIDERS: Maury Dus, MD is PCP   Christinia Gully, MD is Pulmonologist   Nahser, Arnette Norris, MD is Cardiologist   Curt Jews, MD is vascular surgeon  Arlice Colt, MD is neurologist  LABS: Labs reviewed: Acceptable for surgery. (all labs ordered are listed, but only abnormal results are displayed)  Labs Reviewed  CBC WITH DIFFERENTIAL/PLATELET - Abnormal; Notable for the following components:      Result Value   MCV 101.6 (*)    All other components within normal limits  COMPREHENSIVE METABOLIC PANEL - Abnormal; Notable for the following components:   Glucose, Bld 126 (*)    BUN 32 (*)    Creatinine, Ser 1.62 (*)    Calcium 8.8 (*)    GFR calc non Af Amer 38 (*)    GFR calc Af Amer 45 (*)    All other components within normal limits  SURGICAL PCR SCREEN  APTT  PROTIME-INR  URINALYSIS, ROUTINE W REFLEX MICROSCOPIC  TYPE AND SCREEN  ABO/RH     IMAGES: Chest CT 07/22/2017 IMPRESSION: 1. Spectrum of findings compatible with fibrotic interstitial lung disease with mild-to-moderate honeycombing. Slight basilar predominance. Mild progression since 2016. Findings are considered diagnostic of usual interstitial pneumonia (UIP). 2. Stable ectatic 4.1 cm ascending thoracic aorta. Recommend annual imaging followup by CTA or MRA. This recommendation follows 2010 ACCF/AHA/AATS/ACR/ASA/SCA/SCAI/SIR/STS/SVM Guidelines for the Diagnosis and Management of Patients with Thoracic Aortic Disease. Circulation. 2010; 121: G992-E268. 3. Stable dilated main pulmonary artery, suggesting chronic pulmonary arterial hypertension.  EKG: 03/31/2018 Rate 71  bpm Sinus rhythm with 1st degree AV block with occasional Premature ventricular complexes Possible inferior infarct, age undetermined Abnormal ECG  No significant change since last tracing   CV: Echo 06/18/2016 Study Conclusions  - Left ventricle: The cavity size was normal. There was mild   concentric hypertrophy. Systolic function was normal. The   estimated ejection fraction was in the range of 55% to 60%. Wall   motion was normal; there were no regional wall motion   abnormalities. Doppler parameters are consistent with abnormal   left ventricular relaxation (grade 1 diastolic dysfunction). - Aorta: Aortic root dimension: 39 mm (ED). - Ascending aorta: The ascending aorta was mildly dilated. - Left atrium: Volume/bsa, ES, (1-plane Simpson&'s, A2C): 23.8   ml/m^2. - Right ventricle: The cavity size was mildly dilated. Wall   thickness was normal. - Tricuspid valve: There was trivial regurgitation. - Pulmonary arteries: Systolic pressure was mildly increased. PA   peak pressure: 38 mm Hg (S).  Impressions:  - EF is improved when compared to prior study (40%)  Stress Test 04/04/2014 Low risk myoview Past Medical History:  Diagnosis Date  . AAA (abdominal aortic aneurysm) ([place])   . Arthritis   . Back pain   . CAD (coronary artery disease)    Status post CABG  . Complication of anesthesia    35 years ago-spinal did not take for left ankle surgery and then had blood clots in both lungs-all 4 lobes  . COPD (chronic obstructive pulmonary disease) ([place])   . Dizziness    takes Antivert daily  . Dyspnea    on exertion, gets short of breath climbing steps after 6 or 7 steps and if unable to hold onto a railing  . Erectile dysfunction   . GERD (gastroesophageal reflux disease)    takes Omeprazole daily  . History of blood clots    to the lungs   . HTN (hypertension)    takes Amlodipine and Micardis daily  . Hx of cardiovascular stress test    Lexiscan Myoview (2/16):   Possible apical infarct, no ischemia, EF 51%; Low Risk  . Hyperlipidemia    takes Crestor daily  . Myocardial infarction ([place])   . Pneumonia 03/2017   patient states had pneumonia bilateral  . Pulmonary embolism [facility])     Past Surgical History:  Procedure Laterality Date  . Broken Ankle    . Broken Jaw     20+ years ago  . CARDIOVASCULAR STRESS TEST  04-28-2005   EF 53%  . CORONARY ARTERY BYPASS GRAFT  1998   there are sequential 90% stenosis in the proximal LAD, and the mid and distal LAD is a moderate sized vessel. The mid and first diagonal branch has a 50-60% stenosis.   Marland Kitchen ESOPHAGOSCOPY  06/25/2011   Procedure: ESOPHAGOSCOPY;  Surgeon: Izora Gala, MD;  Location: [place];  Service: ENT;  Laterality: N/A;  Direct Esophagoscopy  . TONSILLECTOMY    .  TOTAL SHOULDER ARTHROPLASTY Right 06/02/2013   Procedure: RIGHT TOTAL SHOULDER ARTHROPLASTY;  Surgeon: Marin Shutter, MD;  Location: [place];  Service: Orthopedics;  Laterality: Right;    MEDICATIONS: . albuterol (PROAIR HFA) 108 (90 Base) MCG/ACT inhaler  . Ascorbic Acid (VITAMIN C) 1000 MG tablet  . aspirin EC 81 MG tablet  . budesonide-formoterol (SYMBICORT) 80-4.5 MCG/ACT inhaler  . carvedilol (COREG) 3.125 MG tablet  . cetirizine (ZYRTEC) 10 MG tablet  . DULoxetine (CYMBALTA) 60 MG capsule  . furosemide (LASIX) 40 MG tablet  . losartan (COZAAR) 50 MG tablet  . Multiple Vitamin (MULTIVITAMIN WITH MINERALS) TABS tablet  . nitroGLYCERIN (NITROSTAT) 0.4 MG SL tablet  . pantoprazole (PROTONIX) 40 MG tablet  . potassium chloride (K-DUR) 10 MEQ tablet  . predniSONE (DELTASONE) 10 MG tablet  . rosuvastatin (CRESTOR) 40 MG tablet   No current facility-administered medications for this encounter.      Maia Plan [facility] Pre-Surgical Testing [phone] 04/05/18 4:19 PM

## 2018-04-05 NOTE — Anesthesia Preprocedure Evaluation (Addendum)
Anesthesia Evaluation  Patient identified by MRN, date of birth, ID band Patient awake    Reviewed: Allergy & Precautions, NPO status , Patient's Chart, lab work & pertinent test results  History of Anesthesia Complications Negative for: history of anesthetic complications  Airway Mallampati: II  TM Distance: >3 FB Neck ROM: Full    Dental  (+) Dental Advisory Given   Pulmonary COPD, former smoker, PE   breath sounds clear to auscultation       Cardiovascular hypertension, Pt. on medications (-) angina+ CAD, + Past MI and + CABG (1998)   Rhythm:Regular Rate:Normal   AAA  '19 AAA [place] - There is evidence of abnormal dilitation of the Proximal and Mid Abdominal aorta. The largest aortic measurement is 5.1 cm. The largest aortic diameter remains essentially unchanged compared to prior exam.   '18 TTE - mild concentric LVH. EF 55% to 60%. Grade 1 diastolic dysfunction. Aortic root dimension: 39 mm. The ascending aorta was mildly dilated. RV cavity size was mildly dilated. Trivial TR. PASP was mildly increased, 38 mmHg     Neuro/Psych negative neurological ROS  negative psych ROS   GI/Hepatic Neg liver ROS, GERD  Controlled,  Endo/Other   Obesity   Renal/GU Renal InsufficiencyRenal disease     Musculoskeletal  (+) Arthritis ,   Abdominal   Peds  Hematology negative hematology ROS (+)   Anesthesia Other Findings Hx failed spinal in past  Reproductive/Obstetrics                           Anesthesia Physical Anesthesia Plan  ASA: III  Anesthesia Plan: Spinal   Post-op Pain Management:    Induction:   PONV Risk Score and Plan: 2 and Treatment may vary due to age or medical condition and Propofol infusion  Airway Management Planned: Natural Airway and Simple Face Mask  Additional Equipment: None  Intra-op Plan:   Post-operative Plan:   Informed Consent: I have reviewed the patients  History and Physical, chart, labs and discussed the procedure including the risks, benefits and alternatives for the proposed anesthesia with the patient or authorized representative who has indicated his/her understanding and acceptance.       Plan Discussed with: CRNA and Anesthesiologist  Anesthesia Plan Comments: (Labs reviewed, platelets acceptable. Discussed risks and benefits of spinal, including spinal/epidural hematoma, infection, failed block, and PDPH. Patient expressed understanding and wished to proceed. )      Anesthesia Quick Evaluation

## 2018-04-06 MED ORDER — TRANEXAMIC ACID 1000 MG/10ML IV SOLN
2000.0000 mg | INTRAVENOUS | Status: DC
  Filled 2018-04-06: qty 20

## 2018-04-07 ENCOUNTER — Inpatient Hospital Stay (HOSPITAL_COMMUNITY)
Admission: RE | Admit: 2018-04-07 | Discharge: 2018-04-09 | DRG: 470 | Disposition: A | Discharge status: Home / Self Care | Payer: PPO | Attending: Orthopedic Surgery | Admitting: Orthopedic Surgery

## 2018-04-07 ENCOUNTER — Inpatient Hospital Stay (HOSPITAL_COMMUNITY): Payer: PPO

## 2018-04-07 ENCOUNTER — Inpatient Hospital Stay (HOSPITAL_COMMUNITY): Payer: PPO | Admitting: Physician Assistant

## 2018-04-07 ENCOUNTER — Inpatient Hospital Stay (HOSPITAL_COMMUNITY): Payer: PPO | Admitting: Anesthesiology

## 2018-04-07 ENCOUNTER — Other Ambulatory Visit: Payer: Self-pay

## 2018-04-07 ENCOUNTER — Encounter (HOSPITAL_COMMUNITY): Payer: Self-pay | Admitting: Anesthesiology

## 2018-04-07 ENCOUNTER — Encounter (HOSPITAL_COMMUNITY)
Admission: RE | Disposition: A | Discharge status: Home / Self Care | Payer: Self-pay | Source: Home / Self Care | Attending: Orthopedic Surgery

## 2018-04-07 DIAGNOSIS — Z86718 Personal history of other venous thrombosis and embolism: Secondary | ICD-10-CM | POA: Diagnosis not present

## 2018-04-07 DIAGNOSIS — M169 Osteoarthritis of hip, unspecified: Secondary | ICD-10-CM

## 2018-04-07 DIAGNOSIS — E669 Obesity, unspecified: Secondary | ICD-10-CM | POA: Diagnosis not present

## 2018-04-07 DIAGNOSIS — Z87891 Personal history of nicotine dependence: Secondary | ICD-10-CM

## 2018-04-07 DIAGNOSIS — Z471 Aftercare following joint replacement surgery: Secondary | ICD-10-CM | POA: Diagnosis not present

## 2018-04-07 DIAGNOSIS — Z96642 Presence of left artificial hip joint: Secondary | ICD-10-CM | POA: Diagnosis not present

## 2018-04-07 DIAGNOSIS — K219 Gastro-esophageal reflux disease without esophagitis: Secondary | ICD-10-CM | POA: Diagnosis not present

## 2018-04-07 DIAGNOSIS — Z96611 Presence of right artificial shoulder joint: Secondary | ICD-10-CM | POA: Diagnosis not present

## 2018-04-07 DIAGNOSIS — Z96649 Presence of unspecified artificial hip joint: Secondary | ICD-10-CM

## 2018-04-07 DIAGNOSIS — I252 Old myocardial infarction: Secondary | ICD-10-CM

## 2018-04-07 DIAGNOSIS — Z7951 Long term (current) use of inhaled steroids: Secondary | ICD-10-CM | POA: Diagnosis not present

## 2018-04-07 DIAGNOSIS — M1612 Unilateral primary osteoarthritis, left hip: Principal | ICD-10-CM | POA: Diagnosis present

## 2018-04-07 DIAGNOSIS — Z79899 Other long term (current) drug therapy: Secondary | ICD-10-CM | POA: Diagnosis not present

## 2018-04-07 DIAGNOSIS — Z6831 Body mass index (BMI) 31.0-31.9, adult: Secondary | ICD-10-CM | POA: Diagnosis not present

## 2018-04-07 DIAGNOSIS — Z7982 Long term (current) use of aspirin: Secondary | ICD-10-CM

## 2018-04-07 DIAGNOSIS — I10 Essential (primary) hypertension: Secondary | ICD-10-CM | POA: Diagnosis present

## 2018-04-07 DIAGNOSIS — J449 Chronic obstructive pulmonary disease, unspecified: Secondary | ICD-10-CM | POA: Diagnosis not present

## 2018-04-07 DIAGNOSIS — M25559 Pain in unspecified hip: Secondary | ICD-10-CM

## 2018-04-07 DIAGNOSIS — Z7952 Long term (current) use of systemic steroids: Secondary | ICD-10-CM | POA: Diagnosis not present

## 2018-04-07 DIAGNOSIS — Z951 Presence of aortocoronary bypass graft: Secondary | ICD-10-CM | POA: Diagnosis not present

## 2018-04-07 DIAGNOSIS — E785 Hyperlipidemia, unspecified: Secondary | ICD-10-CM | POA: Diagnosis present

## 2018-04-07 DIAGNOSIS — I2510 Atherosclerotic heart disease of native coronary artery without angina pectoris: Secondary | ICD-10-CM | POA: Diagnosis not present

## 2018-04-07 DIAGNOSIS — Z86711 Personal history of pulmonary embolism: Secondary | ICD-10-CM | POA: Diagnosis not present

## 2018-04-07 HISTORY — PX: TOTAL HIP ARTHROPLASTY: SHX124

## 2018-04-07 LAB — TYPE AND SCREEN
ABO/RH(D): A NEG
Antibody Screen: NEGATIVE

## 2018-04-07 SURGERY — ARTHROPLASTY, HIP, TOTAL, ANTERIOR APPROACH
Anesthesia: Spinal | Site: Hip | Laterality: Left

## 2018-04-07 MED ORDER — HYDROCODONE-ACETAMINOPHEN 7.5-325 MG PO TABS
1.0000 | ORAL_TABLET | ORAL | Status: DC | PRN
  Administered 2018-04-07 – 2018-04-09 (×5): 1 via ORAL
  Administered 2018-04-09: 2 via ORAL
  Filled 2018-04-07: qty 1
  Filled 2018-04-07: qty 2
  Filled 2018-04-07 (×2): qty 1
  Filled 2018-04-07: qty 2
  Filled 2018-04-07: qty 1

## 2018-04-07 MED ORDER — MENTHOL 3 MG MT LOZG
1.0000 | LOZENGE | OROMUCOSAL | Status: DC | PRN
  Administered 2018-04-09: 3 mg via ORAL
  Filled 2018-04-07: qty 9

## 2018-04-07 MED ORDER — FUROSEMIDE 40 MG PO TABS
40.0000 mg | ORAL_TABLET | Freq: Every day | ORAL | Status: DC
  Administered 2018-04-08: 40 mg via ORAL
  Filled 2018-04-07 (×2): qty 1

## 2018-04-07 MED ORDER — DEXAMETHASONE SODIUM PHOSPHATE 10 MG/ML IJ SOLN
10.0000 mg | Freq: Once | INTRAMUSCULAR | Status: AC
  Administered 2018-04-08: 10 mg via INTRAVENOUS
  Filled 2018-04-07: qty 1

## 2018-04-07 MED ORDER — FENTANYL CITRATE (PF) 100 MCG/2ML IJ SOLN
25.0000 ug | INTRAMUSCULAR | Status: DC | PRN
  Administered 2018-04-07 (×2): 50 ug via INTRAVENOUS

## 2018-04-07 MED ORDER — CHLORHEXIDINE GLUCONATE 4 % EX LIQD: 60.0000 mL | Freq: Once | CUTANEOUS | Status: DC

## 2018-04-07 MED ORDER — ACETAMINOPHEN 10 MG/ML IV SOLN
1000.0000 mg | Freq: Four times a day (QID) | INTRAVENOUS | Status: DC
  Administered 2018-04-07: 1000 mg via INTRAVENOUS
  Filled 2018-04-07: qty 100

## 2018-04-07 MED ORDER — ONDANSETRON HCL 4 MG/2ML IJ SOLN: 4.0000 mg | Freq: Four times a day (QID) | INTRAMUSCULAR | Status: DC | PRN

## 2018-04-07 MED ORDER — ACETAMINOPHEN 500 MG PO TABS
500.0000 mg | ORAL_TABLET | Freq: Four times a day (QID) | ORAL | Status: AC
  Administered 2018-04-07 – 2018-04-08 (×4): 500 mg via ORAL
  Filled 2018-04-07 (×4): qty 1

## 2018-04-07 MED ORDER — DIPHENHYDRAMINE HCL 12.5 MG/5ML PO ELIX
12.5000 mg | ORAL_SOLUTION | ORAL | Status: DC | PRN
  Administered 2018-04-08: 12.5 mg via ORAL
  Filled 2018-04-07: qty 5

## 2018-04-07 MED ORDER — LACTATED RINGERS IV SOLN
INTRAVENOUS | Status: DC
  Administered 2018-04-07 (×2): via INTRAVENOUS

## 2018-04-07 MED ORDER — ALBUTEROL SULFATE HFA 108 (90 BASE) MCG/ACT IN AERS: 2.0000 | INHALATION_SPRAY | Freq: Four times a day (QID) | RESPIRATORY_TRACT | Status: DC | PRN

## 2018-04-07 MED ORDER — CEFAZOLIN SODIUM-DEXTROSE 2-4 GM/100ML-% IV SOLN
2.0000 g | Freq: Four times a day (QID) | INTRAVENOUS | Status: AC
  Administered 2018-04-07 (×2): 2 g via INTRAVENOUS
  Filled 2018-04-07 (×2): qty 100

## 2018-04-07 MED ORDER — FENTANYL CITRATE (PF) 100 MCG/2ML IJ SOLN
INTRAMUSCULAR | Status: AC
  Filled 2018-04-07: qty 2

## 2018-04-07 MED ORDER — 0.9 % SODIUM CHLORIDE (POUR BTL) OPTIME
TOPICAL | Status: DC | PRN
  Administered 2018-04-07: 1000 mL

## 2018-04-07 MED ORDER — ONDANSETRON HCL 4 MG/2ML IJ SOLN
INTRAMUSCULAR | Status: AC
  Filled 2018-04-07: qty 2

## 2018-04-07 MED ORDER — ONDANSETRON HCL 4 MG/2ML IJ SOLN: 4.0000 mg | Freq: Once | INTRAMUSCULAR | Status: DC | PRN

## 2018-04-07 MED ORDER — SODIUM CHLORIDE 0.9 % IV SOLN
INTRAVENOUS | Status: DC
  Administered 2018-04-07 – 2018-04-08 (×2): via INTRAVENOUS

## 2018-04-07 MED ORDER — ALBUMIN HUMAN 5 % IV SOLN
INTRAVENOUS | Status: AC
  Filled 2018-04-07: qty 250

## 2018-04-07 MED ORDER — FLEET ENEMA 7-19 GM/118ML RE ENEM: 1.0000 | ENEMA | Freq: Once | RECTAL | Status: DC | PRN

## 2018-04-07 MED ORDER — FENTANYL CITRATE (PF) 100 MCG/2ML IJ SOLN
INTRAMUSCULAR | Status: DC | PRN
  Administered 2018-04-07: 25 ug via INTRAVENOUS

## 2018-04-07 MED ORDER — LOSARTAN POTASSIUM 50 MG PO TABS
50.0000 mg | ORAL_TABLET | Freq: Every day | ORAL | Status: DC
  Administered 2018-04-08 – 2018-04-09 (×2): 50 mg via ORAL
  Filled 2018-04-07 (×2): qty 1

## 2018-04-07 MED ORDER — ALBUTEROL SULFATE (2.5 MG/3ML) 0.083% IN NEBU: 2.5000 mg | INHALATION_SOLUTION | Freq: Four times a day (QID) | RESPIRATORY_TRACT | Status: DC | PRN

## 2018-04-07 MED ORDER — OXYCODONE HCL 5 MG/5ML PO SOLN
5.0000 mg | Freq: Once | ORAL | Status: DC | PRN
  Filled 2018-04-07: qty 5

## 2018-04-07 MED ORDER — POTASSIUM CHLORIDE CRYS ER 10 MEQ PO TBCR
10.0000 meq | EXTENDED_RELEASE_TABLET | Freq: Every day | ORAL | Status: DC
  Administered 2018-04-08 – 2018-04-09 (×2): 10 meq via ORAL
  Filled 2018-04-07 (×2): qty 1

## 2018-04-07 MED ORDER — DEXAMETHASONE SODIUM PHOSPHATE 10 MG/ML IJ SOLN
10.0000 mg | Freq: Once | INTRAMUSCULAR | Status: AC
  Administered 2018-04-07: 8 mg via INTRAVENOUS

## 2018-04-07 MED ORDER — DULOXETINE HCL 60 MG PO CPEP
60.0000 mg | ORAL_CAPSULE | Freq: Every day | ORAL | Status: DC
  Administered 2018-04-08 – 2018-04-09 (×2): 60 mg via ORAL
  Filled 2018-04-07 (×2): qty 1

## 2018-04-07 MED ORDER — MOMETASONE FURO-FORMOTEROL FUM 100-5 MCG/ACT IN AERO
2.0000 | INHALATION_SPRAY | Freq: Two times a day (BID) | RESPIRATORY_TRACT | Status: DC
  Administered 2018-04-07 – 2018-04-09 (×4): 2 via RESPIRATORY_TRACT
  Filled 2018-04-07: qty 8.8

## 2018-04-07 MED ORDER — ROSUVASTATIN CALCIUM 20 MG PO TABS
40.0000 mg | ORAL_TABLET | Freq: Every evening | ORAL | Status: DC
  Administered 2018-04-07 – 2018-04-08 (×2): 40 mg via ORAL
  Filled 2018-04-07 (×2): qty 2

## 2018-04-07 MED ORDER — ONDANSETRON HCL 4 MG/2ML IJ SOLN
INTRAMUSCULAR | Status: DC | PRN
  Administered 2018-04-07: 4 mg via INTRAVENOUS

## 2018-04-07 MED ORDER — BUPIVACAINE IN DEXTROSE 0.75-8.25 % IT SOLN
INTRATHECAL | Status: DC | PRN
  Administered 2018-04-07: 1.6 mL via INTRATHECAL

## 2018-04-07 MED ORDER — PHENOL 1.4 % MT LIQD: 1.0000 | OROMUCOSAL | Status: DC | PRN

## 2018-04-07 MED ORDER — DOCUSATE SODIUM 100 MG PO CAPS
100.0000 mg | ORAL_CAPSULE | Freq: Two times a day (BID) | ORAL | Status: DC
  Administered 2018-04-07 – 2018-04-09 (×4): 100 mg via ORAL
  Filled 2018-04-07 (×4): qty 1

## 2018-04-07 MED ORDER — METOCLOPRAMIDE HCL 5 MG PO TABS: 5.0000 mg | ORAL_TABLET | Freq: Three times a day (TID) | ORAL | Status: DC | PRN

## 2018-04-07 MED ORDER — PROPOFOL 10 MG/ML IV BOLUS
INTRAVENOUS | Status: AC
  Filled 2018-04-07: qty 40

## 2018-04-07 MED ORDER — STERILE WATER FOR IRRIGATION IR SOLN
Status: DC | PRN
  Administered 2018-04-07: 2000 mL

## 2018-04-07 MED ORDER — METHOCARBAMOL 500 MG PO TABS
500.0000 mg | ORAL_TABLET | Freq: Four times a day (QID) | ORAL | Status: DC | PRN
  Administered 2018-04-07 – 2018-04-09 (×3): 500 mg via ORAL
  Filled 2018-04-07 (×3): qty 1

## 2018-04-07 MED ORDER — PREDNISONE 5 MG PO TABS
10.0000 mg | ORAL_TABLET | Freq: Every day | ORAL | Status: DC
  Administered 2018-04-08 – 2018-04-09 (×2): 10 mg via ORAL
  Filled 2018-04-07 (×2): qty 2

## 2018-04-07 MED ORDER — POLYETHYLENE GLYCOL 3350 17 G PO PACK: 17.0000 g | PACK | Freq: Every day | ORAL | Status: DC | PRN

## 2018-04-07 MED ORDER — METHOCARBAMOL 500 MG IVPB - SIMPLE MED
500.0000 mg | Freq: Four times a day (QID) | INTRAVENOUS | Status: DC | PRN
  Filled 2018-04-07: qty 50

## 2018-04-07 MED ORDER — CARVEDILOL 3.125 MG PO TABS
3.1250 mg | ORAL_TABLET | Freq: Two times a day (BID) | ORAL | Status: DC
  Administered 2018-04-07: 3.125 mg via ORAL
  Filled 2018-04-07 (×3): qty 1

## 2018-04-07 MED ORDER — CEFAZOLIN SODIUM-DEXTROSE 2-4 GM/100ML-% IV SOLN
2.0000 g | INTRAVENOUS | Status: AC
  Administered 2018-04-07: 2 g via INTRAVENOUS
  Filled 2018-04-07: qty 100

## 2018-04-07 MED ORDER — NITROGLYCERIN 0.4 MG SL SUBL: 0.4000 mg | SUBLINGUAL_TABLET | SUBLINGUAL | Status: DC | PRN

## 2018-04-07 MED ORDER — SODIUM CHLORIDE 0.9 % IV SOLN
INTRAVENOUS | Status: DC | PRN
  Administered 2018-04-07: 25 ug/min via INTRAVENOUS

## 2018-04-07 MED ORDER — METOCLOPRAMIDE HCL 5 MG/ML IJ SOLN: 5.0000 mg | Freq: Three times a day (TID) | INTRAMUSCULAR | Status: DC | PRN

## 2018-04-07 MED ORDER — PANTOPRAZOLE SODIUM 40 MG PO TBEC
40.0000 mg | DELAYED_RELEASE_TABLET | Freq: Two times a day (BID) | ORAL | Status: DC
  Administered 2018-04-07 – 2018-04-09 (×4): 40 mg via ORAL
  Filled 2018-04-07 (×4): qty 1

## 2018-04-07 MED ORDER — HYDROCODONE-ACETAMINOPHEN 5-325 MG PO TABS
1.0000 | ORAL_TABLET | ORAL | Status: DC | PRN
  Administered 2018-04-09: 1 via ORAL
  Filled 2018-04-07: qty 1

## 2018-04-07 MED ORDER — LORATADINE 10 MG PO TABS
10.0000 mg | ORAL_TABLET | Freq: Every day | ORAL | Status: DC
  Administered 2018-04-07 – 2018-04-08 (×2): 10 mg via ORAL
  Filled 2018-04-07 (×2): qty 1

## 2018-04-07 MED ORDER — OXYCODONE HCL 5 MG PO TABS: 5.0000 mg | ORAL_TABLET | Freq: Once | ORAL | Status: DC | PRN

## 2018-04-07 MED ORDER — BUPIVACAINE HCL (PF) 0.25 % IJ SOLN
INTRAMUSCULAR | Status: DC | PRN
  Administered 2018-04-07: 30 mL

## 2018-04-07 MED ORDER — ALBUMIN HUMAN 5 % IV SOLN
INTRAVENOUS | Status: DC | PRN
  Administered 2018-04-07: 13:00:00 via INTRAVENOUS

## 2018-04-07 MED ORDER — RIVAROXABAN 10 MG PO TABS
10.0000 mg | ORAL_TABLET | Freq: Every day | ORAL | Status: DC
  Administered 2018-04-08 – 2018-04-09 (×2): 10 mg via ORAL
  Filled 2018-04-07 (×2): qty 1

## 2018-04-07 MED ORDER — ONDANSETRON HCL 4 MG PO TABS: 4.0000 mg | ORAL_TABLET | Freq: Four times a day (QID) | ORAL | Status: DC | PRN

## 2018-04-07 MED ORDER — BUPIVACAINE HCL (PF) 0.25 % IJ SOLN
INTRAMUSCULAR | Status: AC
  Filled 2018-04-07: qty 30

## 2018-04-07 MED ORDER — PROPOFOL 500 MG/50ML IV EMUL
INTRAVENOUS | Status: DC | PRN
  Administered 2018-04-07: 40 ug/kg/min via INTRAVENOUS

## 2018-04-07 MED ORDER — BISACODYL 10 MG RE SUPP: 10.0000 mg | Freq: Every day | RECTAL | Status: DC | PRN

## 2018-04-07 SURGICAL SUPPLY — 42 items
ARTICULEZE HEAD (Hips) ×3 IMPLANT
BAG DECANTER FOR FLEXI CONT (MISCELLANEOUS) ×3 IMPLANT
BAG SPEC THK2 15X12 ZIP CLS (MISCELLANEOUS)
BAG ZIPLOCK 12X15 (MISCELLANEOUS) IMPLANT
BLADE SAG 18X100X1.27 (BLADE) ×3 IMPLANT
BLADE SURG SZ10 CARB STEEL (BLADE) ×6 IMPLANT
CLOSURE WOUND 1/2 X4 (GAUZE/BANDAGES/DRESSINGS) ×2
COVER PERINEAL POST (MISCELLANEOUS) ×3 IMPLANT
COVER SURGICAL LIGHT HANDLE (MISCELLANEOUS) ×3 IMPLANT
COVER WAND RF STERILE (DRAPES) IMPLANT
CUP ACETBLR 54 OD PINNACLE (Hips) ×3 IMPLANT
DECANTER SPIKE VIAL GLASS SM (MISCELLANEOUS) ×3 IMPLANT
DRAPE STERI IOBAN 125X83 (DRAPES) ×3 IMPLANT
DRAPE U-SHAPE 47X51 STRL (DRAPES) ×6 IMPLANT
DRSG ADAPTIC 3X8 NADH LF (GAUZE/BANDAGES/DRESSINGS) ×3 IMPLANT
DRSG MEPILEX BORDER 4X4 (GAUZE/BANDAGES/DRESSINGS) ×3 IMPLANT
DRSG MEPILEX BORDER 4X8 (GAUZE/BANDAGES/DRESSINGS) ×3 IMPLANT
DURAPREP 26ML APPLICATOR (WOUND CARE) ×3 IMPLANT
ELECT REM PT RETURN 15FT ADLT (MISCELLANEOUS) ×3 IMPLANT
EVACUATOR 1/8 PVC DRAIN (DRAIN) ×3 IMPLANT
GLOVE BIO SURGEON STRL SZ8 (GLOVE) ×3 IMPLANT
GLOVE BIOGEL PI IND STRL 8 (GLOVE) ×1 IMPLANT
GLOVE BIOGEL PI INDICATOR 8 (GLOVE) ×2
GOWN STRL REUS W/TWL LRG LVL3 (GOWN DISPOSABLE) ×6 IMPLANT
HEAD ARTICULEZE (Hips) IMPLANT
HEAD M SROM 36MM PLUS 1.5 (Hips) ×1 IMPLANT
HOLDER FOLEY CATH W/STRAP (MISCELLANEOUS) ×3 IMPLANT
LINER MARATHON NEUT +4X54X36 (Hips) ×2 IMPLANT
MANIFOLD NEPTUNE II (INSTRUMENTS) ×3 IMPLANT
PACK ANTERIOR HIP CUSTOM (KITS) ×3 IMPLANT
SROM M HEAD 36MM PLUS 1.5 (Hips) ×3 IMPLANT
STEM FEMORAL SZ6 HIGH ACTIS (Stem) ×2 IMPLANT
STRIP CLOSURE SKIN 1/2X4 (GAUZE/BANDAGES/DRESSINGS) ×3 IMPLANT
SUT ETHIBOND NAB CT1 #1 30IN (SUTURE) ×3 IMPLANT
SUT MNCRL AB 4-0 PS2 18 (SUTURE) ×3 IMPLANT
SUT STRATAFIX 0 PDS 27 VIOLET (SUTURE) ×3
SUT VIC AB 2-0 CT1 27 (SUTURE) ×6
SUT VIC AB 2-0 CT1 TAPERPNT 27 (SUTURE) ×2 IMPLANT
SUTURE STRATFX 0 PDS 27 VIOLET (SUTURE) ×1 IMPLANT
SYR 50ML LL SCALE MARK (SYRINGE) IMPLANT
TRAY FOLEY MTR SLVR 16FR STAT (SET/KITS/TRAYS/PACK) ×3 IMPLANT
YANKAUER SUCT BULB TIP 10FT TU (MISCELLANEOUS) ×3 IMPLANT

## 2018-04-07 NOTE — Transfer of Care (Signed)
Immediate Anesthesia Transfer of Care Note  Patient: Brent [name]  Procedure(s) Performed: Procedure(s) with comments: TOTAL HIP ARTHROPLASTY ANTERIOR APPROACH (Left) - 139min  Patient Location: PACU  Anesthesia Type:Spinal  Level of Consciousness:  sedated, patient cooperative and responds to stimulation  Airway & Oxygen Therapy:Patient Spontanous Breathing and Patient connected to face mask oxgen  Post-op Assessment:  Report given to PACU RN and Post -op Vital signs reviewed and stable  Post vital signs:  Reviewed and stable  Last Vitals:  Vitals:   04/07/18 1019  BP: 138/67  Resp: 20  Temp: (!) 36.3 C  SpO2: 68%    Complications: No apparent anesthesia complications

## 2018-04-07 NOTE — Interval H&P Note (Signed)
History and Physical Interval Note:  04/07/2018 10:22 AM  Brent [name]  has presented today for surgery, with the diagnosis of left hip osteoarthritis  The various methods of treatment have been discussed with the patient and family. After consideration of risks, benefits and other options for treatment, the patient has consented to  Procedure(s) with comments: [place] (Left) - 133min as a surgical intervention .  The patient's history has been reviewed, patient examined, no change in status, stable for surgery.  I have reviewed the patient's chart and labs.  Questions were answered to the patient's satisfaction.     Pilar Plate [name]

## 2018-04-07 NOTE — Evaluation (Signed)
Physical Therapy Evaluation Patient Details Name: Brent [name] MRN: [id number] DOB: 1932-02-23 Today's Date: 04/07/2018   History of Present Illness  85 yo male s/p L DA-THA on 04/07/18. PMH includes OA, numbness unspecified, falls, COPD, post-inflammatory pulmonary fibrosis, DOE, R shoulder replacement 2015, CAD s/p CABG 1998, HLD, HTN, AAA, PE.   Clinical Impression   Pt presents with L hip pain, LE weakness, difficulty performing bed mobility, increased time and effort to perform mobility tasks, and decreased tolerance for ambulation due to L hip pain. Pt to benefit from acute PT to address deficits. Pt ambulated 15 ft with RW with min guard assist, verbal cuing provided throughout for safety and form. Pt educated on ankle pumps (20/hour) to perform this afternoon/evening to increase circulation, to pt's tolerance and limited by pain. PT to progress mobility as tolerated, and will continue to follow acutely.        Follow Up Recommendations Follow surgeon's recommendation for DC plan and follow-up therapies;Supervision for mobility/OOB(HEP)    Equipment Recommendations  None recommended by PT    Recommendations for Other Services       Precautions / Restrictions Precautions Precautions: Fall Restrictions Weight Bearing Restrictions: No Other Position/Activity Restrictions: WBAT       Mobility  Bed Mobility Overal bed mobility: Needs Assistance Bed Mobility: Supine to Sit     Supine to sit: HOB elevated;Mod assist     General bed mobility comments: Mod assist for trunk elevation, LE lifting and translation to EOB. Increased time to scoot to EOB.   Transfers Overall transfer level: Needs assistance Equipment used: Rolling walker (2 wheeled) Transfers: Sit to/from Stand Sit to Stand: Min guard;From elevated surface         General transfer comment: Min guard for safety when rising, min assist for steadying upon standing. verbal cuing for hand placement.    Ambulation/Gait Ambulation/Gait assistance: Min guard;+2 safety/equipment Gait Distance (Feet): 15 Feet Assistive device: Rolling walker (2 wheeled) Gait Pattern/deviations: Step-to pattern;Decreased stance time - left;Decreased weight shift to left;Antalgic;Trunk flexed Gait velocity: very decr    General Gait Details: Min guard for safety. Verbal cuing for sequencing with step-to gait LLE leading, placement in RW, upright posture. Pt with increasing antalgic gait with further distance ambulated.  Stairs            Wheelchair Mobility    Modified Rankin (Stroke Patients Only)       Balance Overall balance assessment: Needs assistance;History of Falls Sitting-balance support: No upper extremity supported Sitting balance-Leahy Scale: Good     Standing balance support: Bilateral upper extremity supported Standing balance-Leahy Scale: Poor Standing balance comment: relies on RW and PT for steadying                              Pertinent Vitals/Pain Pain Assessment: 0-10 Pain Score: 5  Pain Location: L hip  Pain Descriptors / Indicators: Sore;Burning Pain Intervention(s): Limited activity within patient's tolerance;Repositioned;Ice applied;Monitored during session    [place] expects to be discharged to:: Private residence Living Arrangements: Spouse/significant other Available Help at Discharge: Family;Available 24 hours/day Type of Home: House Home Access: Stairs to enter Entrance Stairs-Rails: Left Entrance Stairs-Number of Steps: 4 Home Layout: One level Home Equipment: Walker - 2 wheels;Cane - single point      Prior Function Level of Independence: Independent         Comments: Pt reports no use of AD PTA. Pt is former  MLB pitcher.      Hand Dominance   Dominant Hand: Left    Extremity/Trunk Assessment   Upper Extremity Assessment Upper Extremity Assessment: Overall WFL for tasks assessed    Lower Extremity  Assessment Lower Extremity Assessment: Generalized weakness;LLE deficits/detail LLE Deficits / Details: suspected post-surgical hip weakness; able to perform ankle pumps, active assist heel slides, quad set  LLE Sensation: WNL    Cervical / Trunk Assessment Cervical / Trunk Assessment: Normal  Communication   Communication: No difficulties  Cognition Arousal/Alertness: Awake/alert Behavior During Therapy: WFL for tasks assessed/performed Overall Cognitive Status: Within Functional Limits for tasks assessed                                        General Comments      Exercises     Assessment/Plan    PT Assessment Patient needs continued PT services  PT Problem List Decreased strength;Pain;Decreased activity tolerance;Decreased knowledge of use of DME;Decreased balance;Decreased mobility;Decreased safety awareness       PT Treatment Interventions DME instruction;Therapeutic activities;Gait training;Therapeutic exercise;Patient/family education;Balance training;Stair training;Functional mobility training    PT Goals (Current goals can be found in the Care Plan section)  Acute Rehab PT Goals Patient Stated Goal: none stated  PT Goal Formulation: With patient Time For Goal Achievement: 04/14/18 Potential to Achieve Goals: Good    Frequency 7X/week   Barriers to discharge        Co-evaluation               AM-PAC PT "6 Clicks" Mobility  Outcome Measure Help needed turning from your back to your side while in a flat bed without using bedrails?: A Lot Help needed moving from lying on your back to sitting on the side of a flat bed without using bedrails?: A Lot Help needed moving to and from a bed to a chair (including a wheelchair)?: A Little Help needed standing up from a chair using your arms (e.g., wheelchair or bedside chair)?: A Little Help needed to walk in hospital room?: A Little Help needed climbing 3-5 steps with a railing? : A Little 6  Click Score: 16    End of Session Equipment Utilized During Treatment: Gait belt Activity Tolerance: Patient tolerated treatment well;Patient limited by pain Patient left: in chair;with chair alarm set;with call bell/phone within reach;with family/visitor present(Pt taking shift change break from SCDs) Nurse Communication: Mobility status PT Visit Diagnosis: Other abnormalities of gait and mobility (R26.89);Difficulty in walking, not elsewhere classified (R26.2)    Time: 1705-1730 PT Time Calculation (min) (ACUTE ONLY): 25 min   Charges:   PT Evaluation $PT Eval Low Complexity: 1 Low PT Treatments $Gait Training: 8-22 mins        Julien Girt, PT Acute Rehabilitation Services Pager [phone]  Office [phone]  Rozella Servello D Jeslin Bazinet 04/07/2018, 7:00 PM

## 2018-04-07 NOTE — Op Note (Signed)
OPERATIVE REPORT- TOTAL HIP ARTHROPLASTY   PREOPERATIVE DIAGNOSIS: Osteoarthritis of the Left hip.   POSTOPERATIVE DIAGNOSIS: Osteoarthritis of the Left  hip.   PROCEDURE: Left total hip arthroplasty, anterior approach.   SURGEON: Gaynelle Arabian, MD   ASSISTANT: Griffith Citron, PA-C  ANESTHESIA:  Spinal  ESTIMATED BLOOD LOSS:-550 mL    DRAINS: Hemovac x1.   COMPLICATIONS: None   CONDITION: PACU - hemodynamically stable.   BRIEF CLINICAL NOTE: Brent [name] is a 85 y.o. male who has advanced end-  stage arthritis of their Left  hip with progressively worsening pain and  dysfunction.The patient has failed nonoperative management and presents for  total hip arthroplasty.   PROCEDURE IN DETAIL: After successful administration of spinal  anesthetic, the traction boots for the [facility] bed were placed on both  feet and the patient was placed onto the [facility] bed, boots placed into the leg  holders. The Left hip was then isolated from the perineum with plastic  drapes and prepped and draped in the usual sterile fashion. ASIS and  greater trochanter were marked and a oblique incision was made, starting  at about 1 cm lateral and 2 cm distal to the ASIS and coursing towards  the anterior cortex of the femur. The skin was cut with a 10 blade  through subcutaneous tissue to the level of the fascia overlying the  tensor fascia lata muscle. The fascia was then incised in line with the  incision at the junction of the anterior third and posterior 2/3rd. The  muscle was teased off the fascia and then the interval between the TFL  and the rectus was developed. The Hohmann retractor was then placed at  the top of the femoral neck over the capsule. The vessels overlying the  capsule were cauterized and the fat on top of the capsule was removed.  A Hohmann retractor was then placed anterior underneath the rectus  femoris to give exposure to the entire anterior capsule. A T-shaped   capsulotomy was performed. The edges were tagged and the femoral head  was identified.       Osteophytes are removed off the superior acetabulum.  The femoral neck was then cut in situ with an oscillating saw. Traction  was then applied to the left lower extremity utilizing the [facility]  traction. The femoral head was then removed. Retractors were placed  around the acetabulum and then circumferential removal of the labrum was  performed. Osteophytes were also removed. Reaming starts at 51 mm to  medialize and  Increased in 2 mm increments to 53 mm. We reamed in  approximately 40 degrees of abduction, 20 degrees anteversion. A 54 mm  pinnacle acetabular shell was then impacted in anatomic position under  fluoroscopic guidance with excellent purchase. We did not need to place  any additional dome screws. A 36 mm neutral + 4 marathon liner was then  placed into the acetabular shell.       The femoral lift was then placed along the lateral aspect of the femur  just distal to the vastus ridge. The leg was  externally rotated and capsule  was stripped off the inferior aspect of the femoral neck down to the  level of the lesser trochanter, this was done with electrocautery. The femur was lifted after this was performed. The  leg was then placed in an extended and adducted position essentially delivering the femur. We also removed the capsule superiorly and the piriformis from the piriformis  fossa to gain excellent exposure of the  proximal femur. Rongeur was used to remove some cancellous bone to get  into the lateral portion of the proximal femur for placement of the  initial starter reamer. The starter broaches was placed  the starter broach  and was shown to go down the center of the canal. Broaching  with the Actis system was then performed starting at size 0  coursing  Up to size 6. A size 6 had excellent torsional and rotational  and axial stability. The trial high offset neck was then placed   with a 36 + 1.5 trial head. The hip was then reduced. We confirmed that  the stem was in the canal both on AP and lateral x-rays. It also has excellent sizing. The hip was reduced with outstanding stability through full extension and full external rotation.. AP pelvis was taken and the leg lengths were measured and found to be equal. Hip was then dislocated again and the femoral head and neck removed. The  femoral broach was removed. Size 6 Actis stem with a high offset  neck was then impacted into the femur following native anteversion. Has  excellent purchase in the canal. Excellent torsional and rotational and  axial stability. It is confirmed to be in the canal on AP and lateral  fluoroscopic views. The 36 + 1.5 metal head was placed and the hip  reduced with outstanding stability. Again AP pelvis was taken and it  confirmed that the leg lengths were equal. The wound was then copiously  irrigated with saline solution and the capsule reattached and repaired  with Ethibond suture. 30 ml of .25% Bupivicaine was  injected into the capsule and into the edge of the tensor fascia lata as well as subcutaneous tissue. The fascia overlying the tensor fascia lata was then closed with a running #1 V-Loc. Subcu was closed with interrupted 2-0 Vicryl and subcuticular running 4-0 Monocryl. Incision was cleaned  and dried. Steri-Strips and a bulky sterile dressing applied. Hemovac  drain was hooked to suction and then the patient was awakened and transported to  recovery in stable condition.        Please note that a surgical assistant was a medical necessity for this procedure to perform it in a safe and expeditious manner. Assistant was necessary to provide appropriate retraction of vital neurovascular structures and to prevent femoral fracture and allow for anatomic placement of the prosthesis.  Gaynelle Arabian, M.D.

## 2018-04-07 NOTE — Anesthesia Procedure Notes (Signed)
Spinal  Patient location during procedure: OR Start time: 04/07/2018 11:32 AM End time: 04/07/2018 11:36 AM Staffing Anesthesiologist: Audry Pili, MD Performed: anesthesiologist  Preanesthetic Checklist Completed: patient identified, surgical consent, pre-op evaluation, timeout performed, IV checked, risks and benefits discussed and monitors and equipment checked Spinal Block Patient position: sitting Prep: DuraPrep Patient monitoring: heart rate, cardiac monitor, continuous pulse ox and blood pressure Approach: midline Location: L2-3 Injection technique: single-shot Needle Needle type: Pencan  Needle gauge: 24 G Additional Notes Consent was obtained prior to the procedure with all questions answered and concerns addressed. Risks including, but not limited to, bleeding, infection, nerve damage, paralysis, failed block, inadequate analgesia, allergic reaction, high spinal, itching, and headache were discussed and the patient wished to proceed. Functioning IV was confirmed and monitors were applied. Sterile prep and drape, including hand hygiene, mask, and sterile gloves were used. The patient was positioned and the spine was prepped. The skin was anesthetized with lidocaine. Free flow of clear CSF was obtained prior to injecting local anesthetic into the CSF. The spinal needle aspirated freely following injection. The needle was carefully withdrawn. The patient tolerated the procedure well.   Renold Don, MD

## 2018-04-07 NOTE — Plan of Care (Signed)

## 2018-04-07 NOTE — Discharge Instructions (Addendum)
°Dr. Frank Aluisio °Total Joint Specialist °[facility] °[address] °[place], [place] [address] °[phone] ° °ANTERIOR APPROACH TOTAL HIP REPLACEMENT POSTOPERATIVE DIRECTIONS ° ° °Hip Rehabilitation, Guidelines Following Surgery  °The results of a hip operation are greatly improved after range of motion and muscle strengthening exercises. Follow all safety measures which are given to protect your hip. If any of these exercises cause increased pain or swelling in your joint, decrease the amount until you are comfortable again. Then slowly increase the exercises. Call your caregiver if you have problems or questions.  ° °HOME CARE INSTRUCTIONS  °• Remove items at home which could result in a fall. This includes throw rugs or furniture in walking pathways.  °· ICE to the affected hip every three hours for 30 minutes at a time and then as needed for pain and swelling.  Continue to use ice on the hip for pain and swelling from surgery. You may notice swelling that will progress down to the foot and ankle.  This is normal after surgery.  Elevate the leg when you are not up walking on it.   °· Continue to use the breathing machine which will help keep your temperature down.  It is common for your temperature to cycle up and down following surgery, especially at night when you are not up moving around and exerting yourself.  The breathing machine keeps your lungs expanded and your temperature down. ° °DIET °You may resume your previous home diet once your are discharged from the hospital. ° °DRESSING / WOUND CARE / SHOWERING °You may shower 3 days after surgery, but keep the wounds dry during showering.  You may use an occlusive plastic wrap (Press'n Seal for example), NO SOAKING/SUBMERGING IN THE BATHTUB.  If the bandage gets wet, change with a clean dry gauze.  If the incision gets wet, pat the wound dry with a clean towel. °You may start showering once you are discharged home but do not submerge the  incision under water. Just pat the incision dry and apply a dry gauze dressing on daily. °Change the surgical dressing daily and reapply a dry dressing each time. ° °ACTIVITY °Walk with your walker as instructed. °Use walker as long as suggested by your caregivers. °Avoid periods of inactivity such as sitting longer than an hour when not asleep. This helps prevent blood clots.  °You may resume a sexual relationship in one month or when given the OK by your doctor.  °You may return to work once you are cleared by your doctor.  °Do not drive a car for 6 weeks or until released by you surgeon.  °Do not drive while taking narcotics. ° °WEIGHT BEARING °Weight bearing as tolerated with assist device (walker, cane, etc) as directed, use it as long as suggested by your surgeon or therapist, typically at least 4-6 weeks. ° °POSTOPERATIVE CONSTIPATION PROTOCOL °Constipation - defined medically as fewer than three stools per week and severe constipation as less than one stool per week. ° °One of the most common issues patients have following surgery is constipation.  Even if you have a regular bowel pattern at home, your normal regimen is likely to be disrupted due to multiple reasons following surgery.  Combination of anesthesia, postoperative narcotics, change in appetite and fluid intake all can affect your bowels.  In order to avoid complications following surgery, here are some recommendations in order to help you during your recovery period. ° °Colace (docusate) - Pick up an over-the-counter form   of Colace or another stool softener and take twice a day as long as you are requiring postoperative pain medications.  Take with a full glass of water daily.  If you experience loose stools or diarrhea, hold the colace until you stool forms back up.  If your symptoms do not get better within 1 week or if they get worse, check with your doctor. ° °Dulcolax (bisacodyl) - Pick up over-the-counter and take as directed by the product  packaging as needed to assist with the movement of your bowels.  Take with a full glass of water.  Use this product as needed if not relieved by Colace only.  ° °MiraLax (polyethylene glycol) - Pick up over-the-counter to have on hand.  MiraLax is a solution that will increase the amount of water in your bowels to assist with bowel movements.  Take as directed and can mix with a glass of water, juice, soda, coffee, or tea.  Take if you go more than two days without a movement. °Do not use MiraLax more than once per day. Call your doctor if you are still constipated or irregular after using this medication for 7 days in a row. ° °If you continue to have problems with postoperative constipation, please contact the office for further assistance and recommendations.  If you experience "the worst abdominal pain ever" or develop nausea or vomiting, please contact the office immediatly for further recommendations for treatment. ° °ITCHING ° If you experience itching with your medications, try taking only a single pain pill, or even half a pain pill at a time.  You can also use Benadryl over the counter for itching or also to help with sleep.  ° °TED HOSE STOCKINGS °Wear the elastic stockings on both legs for three weeks following surgery during the day but you may remove then at night for sleeping. ° °MEDICATIONS °See your medication summary on the “After Visit Summary” that the nursing staff will review with you prior to discharge.  You may have some home medications which will be placed on hold until you complete the course of blood thinner medication.  It is important for you to complete the blood thinner medication as prescribed by your surgeon.  Continue your approved medications as instructed at time of discharge. ° °PRECAUTIONS °If you experience chest pain or shortness of breath - call 911 immediately for transfer to the hospital emergency department.  °If you develop a fever greater that 101 F, purulent drainage  from wound, increased redness or drainage from wound, foul odor from the wound/dressing, or calf pain - CONTACT YOUR SURGEON.   °                                                °FOLLOW-UP APPOINTMENTS °Make sure you keep all of your appointments after your operation with your surgeon and caregivers. You should call the office at the above phone number and make an appointment for approximately two weeks after the date of your surgery or on the date instructed by your surgeon outlined in the "After Visit Summary". ° °RANGE OF MOTION AND STRENGTHENING EXERCISES  °These exercises are designed to help you keep full movement of your hip joint. Follow your caregiver's or physical therapist's instructions. Perform all exercises about fifteen times, three times per day or as directed. Exercise both hips, even if you have   had only one joint replacement. These exercises can be done on a training (exercise) mat, on the floor, on a table or on a bed. Use whatever works the best and is most comfortable for you. Use music or television while you are exercising so that the exercises are a pleasant break in your day. This will make your life better with the exercises acting as a break in routine you can look forward to.   Lying on your back, slowly slide your foot toward your buttocks, raising your knee up off the floor. Then slowly slide your foot back down until your leg is straight again.   Lying on your back spread your legs as far apart as you can without causing discomfort.   Lying on your side, raise your upper leg and foot straight up from the floor as far as is comfortable. Slowly lower the leg and repeat.   Lying on your back, tighten up the muscle in the front of your thigh (quadriceps muscles). You can do this by keeping your leg straight and trying to raise your heel off the floor. This helps strengthen the largest muscle supporting your knee.   Lying on your back, tighten up the muscles of your buttocks both  with the legs straight and with the knee bent at a comfortable angle while keeping your heel on the floor.   IF YOU ARE TRANSFERRED TO A SKILLED REHAB FACILITY If the patient is transferred to a skilled rehab facility following release from the hospital, a list of the current medications will be sent to the facility for the patient to continue.  When discharged from the skilled rehab facility, please have the facility set up the patient's [place] prior to being released. Also, the skilled facility will be responsible for providing the patient with their medications at time of release from the facility to include their pain medication, the muscle relaxants, and their blood thinner medication. If the patient is still at the rehab facility at time of the two week follow up appointment, the skilled rehab facility will also need to assist the patient in arranging follow up appointment in our office and any transportation needs.  MAKE SURE YOU:   Understand these instructions.   Get help right away if you are not doing well or get worse.    Pick up stool softner and laxative for home use following surgery while on pain medications. Do not submerge incision under water. Please use good hand washing techniques while changing dressing each day. May shower starting three days after surgery. Please use a clean towel to pat the incision dry following showers. Continue to use ice for pain and swelling after surgery. Do not use any lotions or creams on the incision until instructed by your surgeon.  Information on my medicine - XARELTO (Rivaroxaban)  Why was Xarelto prescribed for you? Xarelto was prescribed for you to reduce the risk of blood clots forming after orthopedic surgery. The medical term for these abnormal blood clots is venous thromboembolism (VTE).  What do you need to know about xarelto ? Take your Xarelto ONCE DAILY at the same time every day. You may take it  either with or without food.  If you have difficulty swallowing the tablet whole, you may crush it and mix in applesauce just prior to taking your dose.  Take Xarelto exactly as prescribed by your doctor and DO NOT stop taking Xarelto without talking to the doctor who prescribed the medication.  Stopping without other VTE prevention medication to take the place of Xarelto may increase your risk of developing a clot.  After discharge, you should have regular check-up appointments with your healthcare provider that is prescribing your Xarelto.    What do you do if you miss a dose? If you miss a dose, take it as soon as you remember on the same day then continue your regularly scheduled once daily regimen the next day. Do not take two doses of Xarelto on the same day.   Important Safety Information A possible side effect of Xarelto is bleeding. You should call your healthcare provider right away if you experience any of the following: ? Bleeding from an injury or your nose that does not stop. ? Unusual colored urine (red or dark brown) or unusual colored stools (red or black). ? Unusual bruising for unknown reasons. ? A serious fall or if you hit your head (even if there is no bleeding).  Some medicines may interact with Xarelto and might increase your risk of bleeding while on Xarelto. To help avoid this, consult your healthcare provider or pharmacist prior to using any new prescription or non-prescription medications, including herbals, vitamins, non-steroidal anti-inflammatory drugs (NSAIDs) and supplements.  This website has more information on Xarelto: [email or web address]

## 2018-04-07 NOTE — Anesthesia Postprocedure Evaluation (Signed)
Anesthesia Post Note  Patient: Brent [name]  Procedure(s) Performed: TOTAL HIP ARTHROPLASTY ANTERIOR APPROACH (Left Hip)     Patient location during evaluation: PACU Anesthesia Type: Spinal Level of consciousness: awake and alert Pain management: pain level controlled Vital Signs Assessment: post-procedure vital signs reviewed and stable Respiratory status: spontaneous breathing and respiratory function stable Cardiovascular status: blood pressure returned to baseline and stable Postop Assessment: spinal receding and no apparent nausea or vomiting Anesthetic complications: no    Last Vitals:  Vitals:   04/07/18 1019 04/07/18 1330  BP: 138/67 (!) 111/56  Pulse:  (!) 56  Resp: 20 10  Temp: (!) 36.3 C 36.6 C  SpO2: 99% 100%    Last Pain:  Vitals:   04/07/18 1430  TempSrc:   PainSc: 0-No pain                 Audry Pili

## 2018-04-08 LAB — CBC
HCT: 33.3 % — ABNORMAL LOW (ref 39.0–52.0)
HEMOGLOBIN: 10 g/dL — AB (ref 13.0–17.0)
MCH: 30.9 pg (ref 26.0–34.0)
MCHC: 30 g/dL (ref 30.0–36.0)
MCV: 102.8 fL — ABNORMAL HIGH (ref 80.0–100.0)
Platelets: 145 10*3/uL — ABNORMAL LOW (ref 150–400)
RBC: 3.24 MIL/uL — ABNORMAL LOW (ref 4.22–5.81)
RDW: 13.5 % (ref 11.5–15.5)
WBC: 10.5 10*3/uL (ref 4.0–10.5)
nRBC: 0 % (ref 0.0–0.2)

## 2018-04-08 LAB — BASIC METABOLIC PANEL
Anion gap: 9 (ref 5–15)
BUN: 28 mg/dL — ABNORMAL HIGH (ref 8–23)
CO2: 24 mmol/L (ref 22–32)
CREATININE: 1.46 mg/dL — AB (ref 0.61–1.24)
Calcium: 7.9 mg/dL — ABNORMAL LOW (ref 8.9–10.3)
Chloride: 107 mmol/L (ref 98–111)
GFR calc Af Amer: 50 mL/min — ABNORMAL LOW (ref >60)
GFR calc non Af Amer: 44 mL/min — ABNORMAL LOW (ref >60)
Glucose, Bld: 169 mg/dL — ABNORMAL HIGH (ref 70–99)
Potassium: 4.8 mmol/L (ref 3.5–5.1)
Sodium: 140 mmol/L (ref 135–145)

## 2018-04-08 MED ORDER — HYDROCODONE-ACETAMINOPHEN 5-325 MG PO TABS: 1.0000 | ORAL_TABLET | Freq: Four times a day (QID) | ORAL | 0 refills | Status: AC | PRN

## 2018-04-08 MED ORDER — RIVAROXABAN 10 MG PO TABS: 10.0000 mg | ORAL_TABLET | Freq: Every day | ORAL | 0 refills | Status: AC

## 2018-04-08 MED ORDER — METHOCARBAMOL 500 MG PO TABS: 500.0000 mg | ORAL_TABLET | Freq: Four times a day (QID) | ORAL | 0 refills | Status: AC | PRN

## 2018-04-08 NOTE — Progress Notes (Signed)
Subjective: 1 Day Post-Op Procedure(s) (LRB): TOTAL HIP ARTHROPLASTY ANTERIOR APPROACH (Left) Patient reports pain as mild.   Patient seen in rounds by Dr. Wynelle Link. Patient is well, and has had no acute complaints or problems other than mild discomfort in the left hip. Denies chest pain or SOB. Foley catheter removed this AM. No issues overnight.  We will continue therapy today.   Objective: Vital signs in last 24 hours: Temp:  [96 F (35.6 C)-98.1 F (36.7 C)] 98 F (36.7 C) (02/13 0509) Pulse Rate:  [51-63] 57 (02/13 0509) Resp:  [10-20] 16 (02/13 0509) BP: (111-157)/(56-93) 145/61 (02/13 0509) SpO2:  [99 %-100 %] 99 % (02/13 0608) Weight:  [109.3 kg-109.8 kg] 109.8 kg (02/12 1514)  Intake/Output from previous day:  Intake/Output Summary (Last 24 hours) at 04/08/2018 0748 Last data filed at 04/08/2018 0600 Gross per 24 hour  Intake 3824.27 ml  Output 1940 ml  Net 1884.27 ml    Labs: Recent Labs    04/08/18 0518  HGB 10.0*   Recent Labs    04/08/18 0518  WBC 10.5  RBC 3.24*  HCT 33.3*  PLT 145*   Recent Labs    04/08/18 0518  NA 140  K 4.8  CL 107  CO2 24  BUN 28*  CREATININE 1.46*  GLUCOSE 169*  CALCIUM 7.9*   Exam: General - Patient is Alert and Oriented Extremity - Neurologically intact Neurovascular intact Sensation intact distally Dorsiflexion/Plantar flexion intact Dressing - dressing C/D/I Motor Function - intact, moving foot and toes well on exam.   Past Medical History:  Diagnosis Date  . AAA (abdominal aortic aneurysm) ([place])   . Arthritis   . Back pain   . CAD (coronary artery disease)    Status post CABG  . Complication of anesthesia    35 years ago-spinal did not take for left ankle surgery and then had blood clots in both lungs-all 4 lobes  . COPD (chronic obstructive pulmonary disease) ([place])   . Dizziness    takes Antivert daily  . Dyspnea    on exertion, gets short of breath climbing steps after 6 or 7 steps and if unable  to hold onto a railing  . Erectile dysfunction   . GERD (gastroesophageal reflux disease)    takes Omeprazole daily  . History of blood clots    to the lungs   . HTN (hypertension)    takes Amlodipine and Micardis daily  . Hx of cardiovascular stress test    Lexiscan Myoview (2/16):  Possible apical infarct, no ischemia, EF 51%; Low Risk  . Hyperlipidemia    takes Crestor daily  . Myocardial infarction ([place])   . Pneumonia 03/2017   patient states had pneumonia bilateral  . Pulmonary embolism ([facility])     Assessment/Plan: 1 Day Post-Op Procedure(s) (LRB): TOTAL HIP ARTHROPLASTY ANTERIOR APPROACH (Left) Principal Problem:   OA (osteoarthritis) of hip  Estimated body mass index is 31.93 kg/m as calculated from the following:   Height as of this encounter: 6\' 1"  (1.854 m).   Weight as of this encounter: 109.8 kg. Advance diet Up with therapy D/C IV fluids  DVT Prophylaxis - Xarelto Weight bearing as tolerated. D/C O2 and pulse ox and try on room air. Hemovac pulled without difficulty, will continue therapy.  Plan is to go Home with HEP after hospital stay. Possible discharge this afternoon if progresses with therapy and meeting his goals. Follow-up in the office in 2 weeks.   Theresa Duty, PA-C Orthopedic  Surgery 04/08/2018, 7:48 AM

## 2018-04-08 NOTE — Progress Notes (Signed)
PT NOTE   04/08/18 1500  PT Visit Information  Last PT Received On 04/08/18  Pt reports he is not feeling that well; does not feel like walking just got back from the bathroom. Exercise focused session. Will see in am, should be ready for d/c in am  Assistance Needed +1  History of Present Illness 85 yo male s/p L DA-THA on 04/07/18. PMH includes OA, numbness unspecified, falls, COPD, post-inflammatory pulmonary fibrosis, DOE, R shoulder replacement 2015, CAD s/p CABG 1998, HLD, HTN, AAA, PE.   Subjective Data  Patient Stated Goal none stated   Precautions  Precautions Fall  Restrictions  Weight Bearing Restrictions No  Other Position/Activity Restrictions WBAT   Pain Assessment  Pain Assessment 0-10  Pain Score 4  Pain Location L hip   Pain Descriptors / Indicators Grimacing;Sore  Pain Intervention(s) Limited activity within patient's tolerance;Monitored during session  Cognition  Arousal/Alertness Awake/alert  Behavior During Therapy [facility] for tasks assessed/performed  Overall Cognitive Status Within Functional Limits for tasks assessed  Balance  Overall balance assessment Needs assistance;History of Falls  Sitting-balance support No upper extremity supported  Sitting balance-Leahy Scale Good  Standing balance support Bilateral upper extremity supported  Standing balance-Leahy Scale Poor  Standing balance comment relies on RW and PT for steadying   Total Joint Exercises  Ankle Circles/Pumps AROM;Both;10 reps  Quad Sets AROM;Both;10 reps  Heel Slides AROM;AAROM;15 reps  Hip ABduction/ADduction AAROM;AROM;Left;15 reps  Short Arc Quad AROM;Left;15 reps  PT - End of Session  Equipment Utilized During Treatment Gait belt  Activity Tolerance Patient tolerated treatment well;Patient limited by fatigue  Patient left in chair;with call bell/phone within reach;with chair alarm set  Nurse Communication Mobility status   PT - Assessment/Plan  PT Plan Current plan remains appropriate   PT Visit Diagnosis Other abnormalities of gait and mobility (R26.89);Difficulty in walking, not elsewhere classified (R26.2)  PT Frequency (ACUTE ONLY) 7X/week  Follow Up Recommendations Follow surgeon's recommendation for DC plan and follow-up therapies;Supervision for mobility/OOB  PT equipment None recommended by PT  AM-PAC PT "6 Clicks" Mobility Outcome Measure (Version 2)  Help needed turning from your back to your side while in a flat bed without using bedrails? 2  Help needed moving from lying on your back to sitting on the side of a flat bed without using bedrails? 2  Help needed moving to and from a bed to a chair (including a wheelchair)? 3  Help needed standing up from a chair using your arms (e.g., wheelchair or bedside chair)? 3  Help needed to walk in hospital room? 3  Help needed climbing 3-5 steps with a railing?  3  6 Click Score 16  Consider Recommendation of Discharge To: Home with [facility]  PT Goal Progression  Progress towards PT goals Progressing toward goals  Acute Rehab PT Goals  PT Goal Formulation With patient  Time For Goal Achievement 04/14/18  Potential to Achieve Goals Good  PT Time Calculation  PT Start Time (ACUTE ONLY) 1533  PT Stop Time (ACUTE ONLY) 1547  PT Time Calculation (min) (ACUTE ONLY) 14 min  PT General Charges  $$ ACUTE PT VISIT 1 Visit  PT Treatments  $Therapeutic Exercise 8-22 mins  Kenyon Ana, PT  Pager: [phone] Acute Rehab Dept [facility]): [phone]   04/08/2018

## 2018-04-08 NOTE — Care Management Note (Signed)
Case Management Note  Patient Details  Name: Brent [name] MRN: [id number] Date of Birth: 1931/08/07  Subjective/Objective:       Spoke with patient at bedside. Confirmed plan for HEP. Has RW and 3n1. [phone]             Action/Plan:   Expected Discharge Date:  04/08/18               Expected Discharge Plan:  Home/Self Care  In-House Referral:  NA  Discharge planning Services  CM Consult  Post Acute Care Choice:  NA Choice offered to:  Patient  DME Arranged:  N/A DME Agency:  NA  HH Arranged:    [place] Agency:  NA  Status of Service:  Completed, signed off  If discussed at [place] of Stay Meetings, dates discussed:    Additional Comments:  Guadalupe Maple, RN 04/08/2018, 9:43 AM

## 2018-04-08 NOTE — Consult Note (Signed)
   [facility] Inpatient Consult   04/08/2018  Brent [name] March 04, 1931 [id number]     Patient screened for potential [facility] Care Management needs.  Spoke with [name] at bedside to offer [place] Management services for COPD management. [name] pleasantly declines [facility] Care Management follow up. States " I manage my COPD pretty well."  Provided [facility] Care Management brochure with contact information to call should he change his mind.   Marthenia Rolling, MSN-Ed, RN,BSN [facility] Liaison [phone]

## 2018-04-08 NOTE — Progress Notes (Signed)
Physical Therapy Treatment Patient Details Name: Brent [name] MRN: [id number] DOB: 09-19-1931 Today's Date: 04/08/2018    History of Present Illness 85 yo male s/p L DA-THA on 04/07/18. PMH includes OA, numbness unspecified, falls, COPD, post-inflammatory pulmonary fibrosis, DOE, R shoulder replacement 2015, CAD s/p CABG 1998, HLD, HTN, AAA, PE.     PT Comments    Pt progressing slowly. Limited amb distance d/t fatigue. Will continue to follow. Pt may not be ready to d/c today, will see how he does this pm.    Follow Up Recommendations  Follow surgeon's recommendation for DC plan and follow-up therapies;Supervision for mobility/OOB     Equipment Recommendations  None recommended by PT    Recommendations for Other Services       Precautions / Restrictions Precautions Precautions: Fall Restrictions Weight Bearing Restrictions: No Other Position/Activity Restrictions: WBAT     Mobility  Bed Mobility Overal bed mobility: Needs Assistance Bed Mobility: Supine to Sit     Supine to sit: HOB elevated;Mod assist     General bed mobility comments: Mod assist for trunk elevation, LE lifting and translation to EOB. Increased time to scoot to EOB.   Transfers Overall transfer level: Needs assistance Equipment used: Rolling walker (2 wheeled) Transfers: Sit to/from Stand Sit to Stand: Min guard;From elevated surface         General transfer comment: cues for hand placement, LLE position,  control of descent  Ambulation/Gait Ambulation/Gait assistance: Min guard Gait Distance (Feet): 20 Feet Assistive device: Rolling walker (2 wheeled) Gait Pattern/deviations: Step-to pattern;Antalgic Gait velocity: decr   General Gait Details: cues for sequence, Rw position   Stairs             Wheelchair Mobility    Modified Rankin (Stroke Patients Only)       Balance Overall balance assessment: Needs assistance;History of Falls Sitting-balance support: No upper  extremity supported Sitting balance-Leahy Scale: Good     Standing balance support: Bilateral upper extremity supported Standing balance-Leahy Scale: Poor Standing balance comment: relies on RW and PT for steadying                             Cognition Arousal/Alertness: Awake/alert Behavior During Therapy: WFL for tasks assessed/performed Overall Cognitive Status: Within Functional Limits for tasks assessed                                        Exercises Total Joint Exercises Ankle Circles/Pumps: AROM;Both;10 reps Quad Sets: AROM;Both;10 reps    General Comments        Pertinent Vitals/Pain Pain Assessment: 0-10 Pain Score: 3  Pain Location: L hip  Pain Descriptors / Indicators: Grimacing;Sore Pain Intervention(s): Limited activity within patient's tolerance;Monitored during session;Premedicated before session;Repositioned;Ice applied    Home Living                      Prior Function            PT Goals (current goals can now be found in the care plan section) Acute Rehab PT Goals Patient Stated Goal: none stated  PT Goal Formulation: With patient Time For Goal Achievement: 04/14/18 Potential to Achieve Goals: Good Progress towards PT goals: Progressing toward goals    Frequency    7X/week      PT Plan Current plan remains appropriate  Co-evaluation              AM-PAC PT "6 Clicks" Mobility   Outcome Measure  Help needed turning from your back to your side while in a flat bed without using bedrails?: A Lot Help needed moving from lying on your back to sitting on the side of a flat bed without using bedrails?: A Lot Help needed moving to and from a bed to a chair (including a wheelchair)?: A Little Help needed standing up from a chair using your arms (e.g., wheelchair or bedside chair)?: A Little Help needed to walk in hospital room?: A Little Help needed climbing 3-5 steps with a railing? : A Little 6  Click Score: 16    End of Session Equipment Utilized During Treatment: Gait belt Activity Tolerance: Patient tolerated treatment well;Patient limited by fatigue Patient left: in chair;with call bell/phone within reach;with chair alarm set Nurse Communication: Mobility status PT Visit Diagnosis: Other abnormalities of gait and mobility (R26.89);Difficulty in walking, not elsewhere classified (R26.2)     Time: 2633-3545 PT Time Calculation (min) (ACUTE ONLY): 24 min  Charges:  $Gait Training: 8-22 mins $Therapeutic Activity: 8-22 mins                     Kenyon Ana, PT  Pager: [phone] Acute Rehab Dept [facility]): [phone]   04/08/2018    [facility] 04/08/2018, 11:03 AM

## 2018-04-09 ENCOUNTER — Encounter (HOSPITAL_COMMUNITY): Payer: Self-pay | Admitting: Orthopedic Surgery

## 2018-04-09 LAB — CBC
HEMATOCRIT: 30.7 % — AB (ref 39.0–52.0)
HEMOGLOBIN: 9.4 g/dL — AB (ref 13.0–17.0)
MCH: 30.9 pg (ref 26.0–34.0)
MCHC: 30.6 g/dL (ref 30.0–36.0)
MCV: 101 fL — ABNORMAL HIGH (ref 80.0–100.0)
Platelets: 141 10*3/uL — ABNORMAL LOW (ref 150–400)
RBC: 3.04 MIL/uL — ABNORMAL LOW (ref 4.22–5.81)
RDW: 13.6 % (ref 11.5–15.5)
WBC: 11.3 10*3/uL — ABNORMAL HIGH (ref 4.0–10.5)
nRBC: 0 % (ref 0.0–0.2)

## 2018-04-09 LAB — BASIC METABOLIC PANEL
Anion gap: 7 (ref 5–15)
BUN: 33 mg/dL — ABNORMAL HIGH (ref 8–23)
CO2: 27 mmol/L (ref 22–32)
Calcium: 8.4 mg/dL — ABNORMAL LOW (ref 8.9–10.3)
Chloride: 107 mmol/L (ref 98–111)
Creatinine, Ser: 1.37 mg/dL — ABNORMAL HIGH (ref 0.61–1.24)
GFR calc Af Amer: 55 mL/min — ABNORMAL LOW (ref >60)
GFR calc non Af Amer: 47 mL/min — ABNORMAL LOW (ref >60)
Glucose, Bld: 128 mg/dL — ABNORMAL HIGH (ref 70–99)
Potassium: 4.2 mmol/L (ref 3.5–5.1)
Sodium: 141 mmol/L (ref 135–145)

## 2018-04-09 NOTE — Care Management Important Message (Signed)
Important Message  Patient Details  Name: Brent [name] MRN: [id number] Date of Birth: 10/19/1931   Medicare Important Message Given:  Yes    Kerin Salen 04/09/2018, 1:02 [place] Message  Patient Details  Name: Brent [name] MRN: [id number] Date of Birth: 1931/05/16   Medicare Important Message Given:  Yes    Kerin Salen 04/09/2018, 1:02 PM

## 2018-04-09 NOTE — Progress Notes (Signed)
Physical Therapy Treatment Patient Details Name: Brent [name] MRN: [id number] DOB: 1931/03/20 Today's Date: 04/09/2018    History of Present Illness 85 yo male s/p L DA-THA on 04/07/18. PMH includes OA, numbness unspecified, falls, COPD, post-inflammatory pulmonary fibrosis, DOE, R shoulder replacement 2015, CAD s/p CABG 1998, HLD, HTN, AAA, PE.     PT Comments    Pt is progressing well with mobility. HR 39 at rest prior to PT session, pt asymptomatic, RN aware. Pt ambulated 19' with RW, no loss of balance. Instructed pt in THA HEP. Will plan to do stair training this afternoon.   Follow Up Recommendations  Follow surgeon's recommendation for DC plan and follow-up therapies;Supervision for mobility/OOB     Equipment Recommendations  None recommended by PT    Recommendations for Other Services       Precautions / Restrictions Precautions Precautions: Fall Restrictions Weight Bearing Restrictions: No Other Position/Activity Restrictions: WBAT     Mobility  Bed Mobility Overal bed mobility: Needs Assistance Bed Mobility: Supine to Sit     Supine to sit: Min assist     General bed mobility comments: min A for LLE OOB, pt used gait belt to assist lifting LLE  Transfers Overall transfer level: Needs assistance Equipment used: Rolling walker (2 wheeled) Transfers: Sit to/from Stand Sit to Stand: Min guard;From elevated surface         General transfer comment: cues for hand placement, LLE position,  control of descent  Ambulation/Gait Ambulation/Gait assistance: Min guard Gait Distance (Feet): 60 Feet Assistive device: Rolling walker (2 wheeled) Gait Pattern/deviations: Step-to pattern;Antalgic Gait velocity: decr   General Gait Details: cues for sequence, Rw position, no loss of balance   Stairs             Wheelchair Mobility    Modified Rankin (Stroke Patients Only)       Balance Overall balance assessment: Needs assistance;History of  Falls Sitting-balance support: No upper extremity supported Sitting balance-Leahy Scale: Good     Standing balance support: Bilateral upper extremity supported Standing balance-Leahy Scale: Fair                              Cognition Arousal/Alertness: Awake/alert Behavior During Therapy: WFL for tasks assessed/performed Overall Cognitive Status: Within Functional Limits for tasks assessed                                        Exercises Total Joint Exercises Ankle Circles/Pumps: AROM;Both;10 reps Quad Sets: AROM;Both;10 reps Short Arc Quad: AROM;Left;10 reps;Supine Heel Slides: AROM;AAROM;15 reps;Left;Supine Hip ABduction/ADduction: AAROM;AROM;Left;15 reps;Supine    General Comments        Pertinent Vitals/Pain Pain Score: 4  Pain Location: L hip  Pain Descriptors / Indicators: Grimacing;Sore Pain Intervention(s): Limited activity within patient's tolerance;Monitored during session;Premedicated before session;Ice applied    Home Living                      Prior Function            PT Goals (current goals can now be found in the care plan section) Acute Rehab PT Goals Patient Stated Goal: to walk farther, fix mowers PT Goal Formulation: With patient Time For Goal Achievement: 04/14/18 Potential to Achieve Goals: Good Progress towards PT goals: Progressing toward goals    Frequency  7X/week      PT Plan Current plan remains appropriate    Co-evaluation              AM-PAC PT "6 Clicks" Mobility   Outcome Measure  Help needed turning from your back to your side while in a flat bed without using bedrails?: A Little Help needed moving from lying on your back to sitting on the side of a flat bed without using bedrails?: A Little Help needed moving to and from a bed to a chair (including a wheelchair)?: A Little Help needed standing up from a chair using your arms (e.g., wheelchair or bedside chair)?: A  Little Help needed to walk in hospital room?: A Little Help needed climbing 3-5 steps with a railing? : A Little 6 Click Score: 18    End of Session Equipment Utilized During Treatment: Gait belt Activity Tolerance: Patient tolerated treatment well Patient left: in chair;with call bell/phone within reach;with chair alarm set Nurse Communication: Mobility status PT Visit Diagnosis: Other abnormalities of gait and mobility (R26.89);Difficulty in walking, not elsewhere classified (R26.2)     Time: 6803-2122 PT Time Calculation (min) (ACUTE ONLY): 33 min  Charges:  $Gait Training: 8-22 mins $Therapeutic Exercise: 8-22 mins                     Blondell Reveal Kistler PT 04/09/2018  Acute Rehabilitation Services Pager [phone] Office [phone]

## 2018-04-09 NOTE — Care Management Note (Signed)
Case Management Note  Patient Details  Name: Brent [name] MRN: [id number] Date of Birth: April 26, 1931  Subjective/Objective:                  Discharged   Action/Plan: To home with HEP has needed equipment at home  Expected Discharge Date:  04/09/18               Expected Discharge Plan:  Home/Self Care  In-House Referral:  NA  Discharge planning Services  CM Consult  Post Acute Care Choice:  NA Choice offered to:  Patient  DME Arranged:  N/A DME Agency:  NA  HH Arranged:    [place] Agency:  NA  Status of Service:  Completed, signed off  If discussed at [place] of Stay Meetings, dates discussed:    Additional Comments:  Leeroy Cha, RN 04/09/2018, 10:53 AM

## 2018-04-09 NOTE — Progress Notes (Signed)
Physical Therapy Treatment Patient Details Name: Brent [name] MRN: [id number] DOB: 04-Jan-1932 Today's Date: 04/09/2018    History of Present Illness 85 yo male s/p L DA-THA on 04/07/18. PMH includes OA, numbness unspecified, falls, COPD, post-inflammatory pulmonary fibrosis, DOE, R shoulder replacement 2015, CAD s/p CABG 1998, HLD, HTN, AAA, PE.     PT Comments    Pt is ready to DC home from PT standpoint. He completed stair training and ambulated 125' with RW. He is progressing well with mobility.   Follow Up Recommendations  Follow surgeon's recommendation for DC plan and follow-up therapies;Supervision for mobility/OOB     Equipment Recommendations  None recommended by PT    Recommendations for Other Services       Precautions / Restrictions Precautions Precautions: Fall Restrictions Weight Bearing Restrictions: No Other Position/Activity Restrictions: WBAT     Mobility  Bed Mobility Overal bed mobility: Needs Assistance Bed Mobility: Sit to Supine     Supine to sit: Min assist Sit to supine: Min assist   General bed mobility comments: min A for LLE in to bed  Transfers Overall transfer level: Needs assistance Equipment used: Rolling walker (2 wheeled) Transfers: Sit to/from Stand Sit to Stand: Min guard;From elevated surface         General transfer comment: cues for hand placement, LLE position,  control of descent  Ambulation/Gait Ambulation/Gait assistance: Min guard Gait Distance (Feet): 125 Feet Assistive device: Rolling walker (2 wheeled) Gait Pattern/deviations: Antalgic;Step-through pattern;Step-to pattern Gait velocity: decr   General Gait Details: good sequencing, no loss of balance   Stairs Stairs: Yes Stairs assistance: Min guard Stair Management: One rail Right;Forwards;Step to pattern;With cane Number of Stairs: 5 General stair comments: VCs sequencing   Wheelchair Mobility    Modified Rankin (Stroke Patients Only)        Balance Overall balance assessment: Needs assistance;History of Falls Sitting-balance support: No upper extremity supported Sitting balance-Leahy Scale: Good     Standing balance support: Bilateral upper extremity supported Standing balance-Leahy Scale: Fair                              Cognition Arousal/Alertness: Awake/alert Behavior During Therapy: WFL for tasks assessed/performed Overall Cognitive Status: Within Functional Limits for tasks assessed                                        Exercises Total Joint Exercises Ankle Circles/Pumps: AROM;Both;10 reps Quad Sets: AROM;Both;10 reps Short Arc Quad: AROM;Left;10 reps;Supine Heel Slides: AROM;AAROM;15 reps;Left;Supine Hip ABduction/ADduction: AAROM;AROM;Left;15 reps;Supine    General Comments        Pertinent Vitals/Pain Pain Score: 6  Pain Location: L hip  Pain Descriptors / Indicators: Grimacing;Sore Pain Intervention(s): Limited activity within patient's tolerance;Monitored during session;Premedicated before session;Ice applied    Home Living                      Prior Function            PT Goals (current goals can now be found in the care plan section) Acute Rehab PT Goals Patient Stated Goal: to walk farther, fix mowers PT Goal Formulation: With patient Time For Goal Achievement: 04/14/18 Potential to Achieve Goals: Good Progress towards PT goals: Progressing toward goals    Frequency    7X/week      PT Plan  Current plan remains appropriate    Co-evaluation              AM-PAC PT "6 Clicks" Mobility   Outcome Measure  Help needed turning from your back to your side while in a flat bed without using bedrails?: A Little Help needed moving from lying on your back to sitting on the side of a flat bed without using bedrails?: A Little Help needed moving to and from a bed to a chair (including a wheelchair)?: A Little Help needed standing up from a  chair using your arms (e.g., wheelchair or bedside chair)?: A Little Help needed to walk in hospital room?: A Little Help needed climbing 3-5 steps with a railing? : A Little 6 Click Score: 18    End of Session Equipment Utilized During Treatment: Gait belt Activity Tolerance: Patient tolerated treatment well Patient left: with call bell/phone within reach;with chair alarm set;in bed Nurse Communication: Mobility status PT Visit Diagnosis: Other abnormalities of gait and mobility (R26.89);Difficulty in walking, not elsewhere classified (R26.2)     Time: 3825-0539 PT Time Calculation (min) (ACUTE ONLY): 28 min  Charges:  $Gait Training: 8-22 mins  $Self Care/Home Management: 8-22                     Blondell Reveal Kistler PT 04/09/2018  Acute Rehabilitation Services Pager [phone] Office [phone]

## 2018-04-09 NOTE — Progress Notes (Signed)
   Subjective: 2 Days Post-Op Procedure(s) (LRB): TOTAL HIP ARTHROPLASTY ANTERIOR APPROACH (Left) Patient reports pain as mild.   Patient seen in rounds by Dr. Wynelle Link. Patient is well, and has had no acute complaints or problems other than discomfort in the left hip. Denies chest pain or SOB. Voiding without difficulty and positive flatus. No issues overnight. Plan is to go Home after hospital stay.  Objective: Vital signs in last 24 hours: Temp:  [97.7 F (36.5 C)-98.1 F (36.7 C)] 97.8 F (36.6 C) (02/14 0539) Pulse Rate:  [37-63] 63 (02/14 0539) Resp:  [15-16] 16 (02/14 0539) BP: (123-152)/(49-96) 144/54 (02/14 0539) SpO2:  [94 %-98 %] 97 % (02/14 0539)  Intake/Output from previous day:  Intake/Output Summary (Last 24 hours) at 04/09/2018 0737 Last data filed at 04/09/2018 0600 Gross per 24 hour  Intake 1779.57 ml  Output 970 ml  Net 809.57 ml    Labs: Recent Labs    04/08/18 0518 04/09/18 0441  HGB 10.0* 9.4*   Recent Labs    04/08/18 0518 04/09/18 0441  WBC 10.5 11.3*  RBC 3.24* 3.04*  HCT 33.3* 30.7*  PLT 145* 141*   Recent Labs    04/08/18 0518 04/09/18 0441  NA 140 141  K 4.8 4.2  CL 107 107  CO2 24 27  BUN 28* 33*  CREATININE 1.46* 1.37*  GLUCOSE 169* 128*  CALCIUM 7.9* 8.4*   Exam: General - Patient is Alert and Oriented Extremity - Neurologically intact Neurovascular intact Sensation intact distally Dorsiflexion/Plantar flexion intact Dressing/Incision - clean, dry, no drainage Motor Function - intact, moving foot and toes well on exam.   Past Medical History:  Diagnosis Date  . AAA (abdominal aortic aneurysm) ([place])   . Arthritis   . Back pain   . CAD (coronary artery disease)    Status post CABG  . Complication of anesthesia    35 years ago-spinal did not take for left ankle surgery and then had blood clots in both lungs-all 4 lobes  . COPD (chronic obstructive pulmonary disease) ([place])   . Dizziness    takes Antivert daily  .  Dyspnea    on exertion, gets short of breath climbing steps after 6 or 7 steps and if unable to hold onto a railing  . Erectile dysfunction   . GERD (gastroesophageal reflux disease)    takes Omeprazole daily  . History of blood clots    to the lungs   . HTN (hypertension)    takes Amlodipine and Micardis daily  . Hx of cardiovascular stress test    Lexiscan Myoview (2/16):  Possible apical infarct, no ischemia, EF 51%; Low Risk  . Hyperlipidemia    takes Crestor daily  . Myocardial infarction ([place])   . Pneumonia 03/2017   patient states had pneumonia bilateral  . Pulmonary embolism ([facility])     Assessment/Plan: 2 Days Post-Op Procedure(s) (LRB): TOTAL HIP ARTHROPLASTY ANTERIOR APPROACH (Left) Principal Problem:   OA (osteoarthritis) of hip  Estimated body mass index is 31.93 kg/m as calculated from the following:   Height as of this encounter: 6\' 1"  (1.854 m).   Weight as of this encounter: 109.8 kg. Up with therapy D/C IV fluids  DVT Prophylaxis - Xarelto Weight-bearing as tolerated  Plan for discharge to home with HEP later today once meeting goals with therapy. Follow-up in the office in 2 weeks.  Theresa Duty, PA-C Orthopedic Surgery 04/09/2018, 7:37 AM

## 2018-04-12 NOTE — Discharge Summary (Signed)
Physician Discharge Summary   Patient ID: Brent [name] MRN: [id number] DOB/AGE: 1931-07-07 85 y.o.  Admit date: 04/07/2018 Discharge date: 04/09/2018  Primary Diagnosis: Osteoarthritis, left hip   Admission Diagnoses:  Past Medical History:  Diagnosis Date  . AAA (abdominal aortic aneurysm) ([place])   . Arthritis   . Back pain   . CAD (coronary artery disease)    Status post CABG  . Complication of anesthesia    35 years ago-spinal did not take for left ankle surgery and then had blood clots in both lungs-all 4 lobes  . COPD (chronic obstructive pulmonary disease) ([place])   . Dizziness    takes Antivert daily  . Dyspnea    on exertion, gets short of breath climbing steps after 6 or 7 steps and if unable to hold onto a railing  . Erectile dysfunction   . GERD (gastroesophageal reflux disease)    takes Omeprazole daily  . History of blood clots    to the lungs   . HTN (hypertension)    takes Amlodipine and Micardis daily  . Hx of cardiovascular stress test    Lexiscan Myoview (2/16):  Possible apical infarct, no ischemia, EF 51%; Low Risk  . Hyperlipidemia    takes Crestor daily  . Myocardial infarction ([place])   . Pneumonia 03/2017   patient states had pneumonia bilateral  . Pulmonary embolism ([place])    Discharge Diagnoses:   Principal Problem:   OA (osteoarthritis) of hip  Estimated body mass index is 31.93 kg/m as calculated from the following:   Height as of this encounter: 6\' 1"  (1.854 m).   Weight as of this encounter: 109.8 kg.  Procedure:  Procedure(s) (LRB): TOTAL HIP ARTHROPLASTY ANTERIOR APPROACH (Left)   Consults: None  HPI: Brent [name] is a 85 y.o. male who has advanced end-stage arthritis of their Left  hip with progressively worsening pain and dysfunction.The patient has failed nonoperative management and presents for total hip arthroplasty.   Laboratory Data: Admission on 04/07/2018, Discharged on 04/09/2018  Component Date Value Ref Range Status  .  WBC 04/08/2018 10.5  4.0 - 10.5 K/uL Final  . RBC 04/08/2018 3.24* 4.22 - 5.81 MIL/uL Final  . Hemoglobin 04/08/2018 10.0* 13.0 - 17.0 g/dL Final  . HCT 04/08/2018 33.3* 39.0 - 52.0 % Final  . MCV 04/08/2018 102.8* 80.0 - 100.0 fL Final  . MCH 04/08/2018 30.9  26.0 - 34.0 pg Final  . MCHC 04/08/2018 30.0  30.0 - 36.0 g/dL Final  . RDW 04/08/2018 13.5  11.5 - 15.5 % Final  . Platelets 04/08/2018 145* 150 - 400 K/uL Final  . nRBC 04/08/2018 0.0  0.0 - 0.2 % Final   Performed at [facility], [place] [address], [place], [place] [address]  . Sodium 04/08/2018 140  135 - 145 mmol/L Final  . Potassium 04/08/2018 4.8  3.5 - 5.1 mmol/L Final  . Chloride 04/08/2018 107  98 - 111 mmol/L Final  . CO2 04/08/2018 24  22 - 32 mmol/L Final  . Glucose, Bld 04/08/2018 169* 70 - 99 mg/dL Final  . BUN 04/08/2018 28* 8 - 23 mg/dL Final  . Creatinine, Ser 04/08/2018 1.46* 0.61 - 1.24 mg/dL Final  . Calcium 04/08/2018 7.9* 8.9 - 10.3 mg/dL Final  . GFR calc non Af Amer 04/08/2018 44* >60 mL/min Final  . GFR calc Af Amer 04/08/2018 50* >60 mL/min Final  . Anion gap 04/08/2018 9  5 - 15 Final   Performed at  [facility], [place] [address], [place], [place] [address]  . WBC 04/09/2018 11.3* 4.0 - 10.5 K/uL Final  . RBC 04/09/2018 3.04* 4.22 - 5.81 MIL/uL Final  . Hemoglobin 04/09/2018 9.4* 13.0 - 17.0 g/dL Final  . HCT 04/09/2018 30.7* 39.0 - 52.0 % Final  . MCV 04/09/2018 101.0* 80.0 - 100.0 fL Final  . MCH 04/09/2018 30.9  26.0 - 34.0 pg Final  . MCHC 04/09/2018 30.6  30.0 - 36.0 g/dL Final  . RDW 04/09/2018 13.6  11.5 - 15.5 % Final  . Platelets 04/09/2018 141* 150 - 400 K/uL Final  . nRBC 04/09/2018 0.0  0.0 - 0.2 % Final   Performed at [facility], [place] [address] [place], [place] [address]  . Sodium 04/09/2018 141  135 - 145 mmol/L Final  . Potassium 04/09/2018 4.2  3.5 - 5.1 mmol/L Final  . Chloride 04/09/2018 107  98 - 111 mmol/L Final  . CO2  04/09/2018 27  22 - 32 mmol/L Final  . Glucose, Bld 04/09/2018 128* 70 - 99 mg/dL Final  . BUN 04/09/2018 33* 8 - 23 mg/dL Final  . Creatinine, Ser 04/09/2018 1.37* 0.61 - 1.24 mg/dL Final  . Calcium 04/09/2018 8.4* 8.9 - 10.3 mg/dL Final  . GFR calc non Af Amer 04/09/2018 47* >60 mL/min Final  . GFR calc Af Amer 04/09/2018 55* >60 mL/min Final  . Anion gap 04/09/2018 7  5 - 15 Final   Performed at [facility], [place] [address] [place], [place] [address]  Hospital Outpatient Visit on 03/31/2018  Component Date Value Ref Range Status  . MRSA, PCR 03/31/2018 NEGATIVE  NEGATIVE Final  . Staphylococcus aureus 03/31/2018 NEGATIVE  NEGATIVE Final   Comment: (NOTE) The Xpert SA Assay (FDA approved for NASAL specimens in patients 1 years of age and older), is one component of a comprehensive surveillance program. It is not intended to diagnose infection nor to guide or monitor treatment. Performed at [facility], [place] [address], [place], [place] [address]   . aPTT 03/31/2018 25  24 - 36 seconds Final   Performed at [facility], [place] [address] [place],  [address]  . WBC 03/31/2018 8.0  4.0 - 10.5 K/uL Final  . RBC 03/31/2018 4.40  4.22 - 5.81 MIL/uL Final  . Hemoglobin 03/31/2018 13.5  13.0 - 17.0 g/dL Final  . HCT 03/31/2018 44.7  39.0 - 52.0 % Final  . MCV 03/31/2018 101.6* 80.0 - 100.0 fL Final  . MCH 03/31/2018 30.7  26.0 - 34.0 pg Final  . MCHC 03/31/2018 30.2  30.0 - 36.0 g/dL Final  . RDW 03/31/2018 13.4  11.5 - 15.5 % Final  . Platelets 03/31/2018 200  150 - 400 K/uL Final  . nRBC 03/31/2018 0.0  0.0 - 0.2 % Final  . Neutrophils Relative % 03/31/2018 77  % Final  . Neutro Abs 03/31/2018 6.1  1.7 - 7.7 K/uL Final  . Lymphocytes Relative 03/31/2018 17  % Final  . Lymphs Abs 03/31/2018 1.3  0.7 - 4.0 K/uL Final  . Monocytes Relative 03/31/2018 5  % Final  . Monocytes Absolute 03/31/2018 0.4  0.1 - 1.0 K/uL Final  .  Eosinophils Relative 03/31/2018 1  % Final  . Eosinophils Absolute 03/31/2018 0.1  0.0 - 0.5 K/uL Final  . Basophils Relative 03/31/2018 0  % Final  . Basophils Absolute 03/31/2018 0.0  0.0 - 0.1 K/uL Final  . Immature Granulocytes 03/31/2018  0  % Final  . Abs Immature Granulocytes 03/31/2018 0.02  0.00 - 0.07 K/uL Final   Performed at [facility], [place] [address], [place], [place] [address]  . Sodium 03/31/2018 144  135 - 145 mmol/L Final  . Potassium 03/31/2018 4.4  3.5 - 5.1 mmol/L Final  . Chloride 03/31/2018 107  98 - 111 mmol/L Final  . CO2 03/31/2018 29  22 - 32 mmol/L Final  . Glucose, Bld 03/31/2018 126* 70 - 99 mg/dL Final  . BUN 03/31/2018 32* 8 - 23 mg/dL Final  . Creatinine, Ser 03/31/2018 1.62* 0.61 - 1.24 mg/dL Final  . Calcium 03/31/2018 8.8* 8.9 - 10.3 mg/dL Final  . Total Protein 03/31/2018 7.2  6.5 - 8.1 g/dL Final  . Albumin 03/31/2018 3.8  3.5 - 5.0 g/dL Final  . AST 03/31/2018 28  15 - 41 U/L Final  . ALT 03/31/2018 20  0 - 44 U/L Final  . Alkaline Phosphatase 03/31/2018 64  38 - 126 U/L Final  . Total Bilirubin 03/31/2018 0.3  0.3 - 1.2 mg/dL Final  . GFR calc non Af Amer 03/31/2018 38* >60 mL/min Final  . GFR calc Af Amer 03/31/2018 45* >60 mL/min Final  . Anion gap 03/31/2018 8  5 - 15 Final   Performed at [facility], [place] [address], [place], [place] [address]  . Prothrombin Time 03/31/2018 13.7  11.4 - 15.2 seconds Final  . INR 03/31/2018 1.06   Final   Performed at [place] [address], [place], [place] [address]  . ABO/RH(D) 03/31/2018 A NEG   Final  . Antibody Screen 03/31/2018 NEG   Final  . Sample Expiration 03/31/2018 04/10/2018   Final  . Extend sample reason 03/31/2018    Final                   Value:NO TRANSFUSIONS OR PREGNANCY IN THE PAST 3 MONTHS Performed at [facility], [place] [address] [place], [place] [address]   . ABO/RH(D) 03/31/2018    Final                    Value:A NEG Performed at [facility], [place] [address], [place], [place] [address]   . Color, Urine 03/31/2018 YELLOW  YELLOW Final  . APPearance 03/31/2018 CLEAR  CLEAR Final  . Specific Gravity, Urine 03/31/2018 1.013  1.005 - 1.030 Final  . pH 03/31/2018 6.0  5.0 - 8.0 Final  . Glucose, UA 03/31/2018 NEGATIVE  NEGATIVE mg/dL Final  . Hgb urine dipstick 03/31/2018 NEGATIVE  NEGATIVE Final  . Bilirubin Urine 03/31/2018 NEGATIVE  NEGATIVE Final  . Ketones, ur 03/31/2018 NEGATIVE  NEGATIVE mg/dL Final  . Protein, ur 03/31/2018 NEGATIVE  NEGATIVE mg/dL Final  . Nitrite 03/31/2018 NEGATIVE  NEGATIVE Final  . Leukocytes, UA 03/31/2018 NEGATIVE  NEGATIVE Final   Performed at [facility], [place] [address] [place],  [address]  Appointment on 03/26/2018  Component Date Value Ref Range Status  . Glucose 03/26/2018 109* 65 - 99 mg/dL Final  . BUN 03/26/2018 26  8 - 27 mg/dL Final  . Creatinine, Ser 03/26/2018 1.67* 0.76 - 1.27 mg/dL Final  . GFR calc non Af Amer 03/26/2018 37* >59 mL/min/1.73 Final  . GFR calc Af Amer 03/26/2018 43* >59 mL/min/1.73 Final  . BUN/Creatinine Ratio 03/26/2018 16  10 - 24 Final  . Sodium 03/26/2018 146* 134 - 144 mmol/L Final  .  Potassium 03/26/2018 4.5  3.5 - 5.2 mmol/L Final  . Chloride 03/26/2018 100  96 - 106 mmol/L Final  . CO2 03/26/2018 26  20 - 29 mmol/L Final  . Calcium 03/26/2018 9.5  8.6 - 10.2 mg/dL Final  Office Visit on 03/05/2018  Component Date Value Ref Range Status  . Cholesterol, Total 03/05/2018 160  100 - 199 mg/dL Final  . Triglycerides 03/05/2018 60  0 - 149 mg/dL Final  . HDL 03/05/2018 62  >39 mg/dL Final  . VLDL Cholesterol Cal 03/05/2018 12  5 - 40 mg/dL Final  . LDL Calculated 03/05/2018 86  0 - 99 mg/dL Final  . Chol/HDL Ratio 03/05/2018 2.6  0.0 - 5.0 ratio Final   Comment:                                   T. Chol/HDL Ratio                                             Men  Women                                1/2 Avg.Risk  3.4    3.3                                   Avg.Risk  5.0    4.4                                2X Avg.Risk  9.6    7.1                                3X Avg.Risk 23.4   11.0   . Glucose 03/05/2018 107* 65 - 99 mg/dL Final  . BUN 03/05/2018 21  8 - 27 mg/dL Final  . Creatinine, Ser 03/05/2018 1.35* 0.76 - 1.27 mg/dL Final  . GFR calc non Af Amer 03/05/2018 48* >59 mL/min/1.73 Final  . GFR calc Af Amer 03/05/2018 55* >59 mL/min/1.73 Final  . BUN/Creatinine Ratio 03/05/2018 16  10 - 24 Final  . Sodium 03/05/2018 146* 134 - 144 mmol/L Final  . Potassium 03/05/2018 4.2  3.5 - 5.2 mmol/L Final  . Chloride 03/05/2018 105  96 - 106 mmol/L Final  . CO2 03/05/2018 24  20 - 29 mmol/L Final  . Calcium 03/05/2018 9.2  8.6 - 10.2 mg/dL Final  . Total Protein 03/05/2018 6.2  6.0 - 8.5 g/dL Final  . Albumin 03/05/2018 3.8  3.5 - 4.7 g/dL Final   Comment:     **Effective March 15, 2018 Albumin reference**       interval will be changing to:              Age                Male          Male           0 -  7 days        3.6 - 4.9  3.6 - 4.9           8 - 30 days        3.4 - 4.7      3.4 - 4.7           1 -  6 month       3.7 - 4.8      3.7 - 4.8    7 months -  2 years       3.9 - 5.0      3.9 - 5.0           3 -  5 years       4.0 - 5.0      4.0 - 5.0           6 - 12 years       4.1 - 5.0      4.0 - 5.0          13 - 30 years       4.1 - 5.2      3.9 - 5.0          31 - 50 years       4.0 - 5.0      3.8 - 4.8          51 - 60 years       3.8 - 4.9      3.8 - 4.9          61 - 70 years       3.8 - 4.8      3.8 - 4.8          71 - 80 years       3.7 - 4.7      3.7 - 4.7          81 - 89 years       3.6 - 4.6      3.6 - 4.6              >89 years       3.5 - 4.6      3.5 - 4.6   . Bilirubin Total 03/05/2018 0.3  0.0 - 1.2 mg/dL Final  . Bilirubin, Direct 03/05/2018 0.12  0.00 - 0.40 mg/dL Final  . Alkaline Phosphatase 03/05/2018 82  39 - 117 IU/L  Final  . AST 03/05/2018 21  0 - 40 IU/L Final  . ALT 03/05/2018 13  0 - 44 IU/L Final     X-Rays:Dg Pelvis Portable  Result Date: 04/07/2018 CLINICAL DATA:  LEFT total hip replacement EXAM: PORTABLE PELVIS 1-2 VIEWS COMPARISON:  Portable exam 1341 hours compared intraoperative images of 04/07/2018 FINDINGS: LEFT hip prosthesis identified in expected position. No fracture, dislocation, or bone destruction. Bones appear demineralized. Surgical drain overlies the operative site. Numerous pelvic phleboliths noted. IMPRESSION: LEFT hip prosthesis without acute complication. Electronically Signed   By: Lavonia Dana M.D.   On: 04/07/2018 15:28   Dg C-arm 1-60 Min-no Report  Result Date: 04/07/2018 Fluoroscopy was utilized by the requesting physician.  No radiographic interpretation.   Dg Hip Operative Unilat W Or W/o Pelvis Left  Result Date: 04/07/2018 CLINICAL DATA:  ANTERIOR approach LEFT total hip arthroplasty. EXAM: OPERATIVE LEFT HIP (WITH PELVIS IF PERFORMED) 1 VIEW TECHNIQUE: A single fluoroscopic spot image was submitted for interpretation post-operatively. COMPARISON:  None. FINDINGS: Single AP spot image over the LEFT hip demonstrates anatomic alignment of the LEFT hip prosthesis. The prosthesis is  incompletely imaged on the spot image. The fluoroscopic device documented 8 seconds of fluoroscopy time with a patient dose of 0.56 mGy. IMPRESSION: Anatomic alignment of the LEFT hip prosthesis in the AP projection. Electronically Signed   By: Evangeline Dakin M.D.   On: 04/07/2018 14:23    EKG: Orders placed or performed during the hospital encounter of 04/07/18  . EKG 12-Lead  . EKG 12-Lead     Hospital Course: JAEL KOSTICK is a 85 y.o. who was admitted to [facility]. They were brought to the operating room on 04/07/2018 and underwent Procedure(s): [place].  Patient tolerated the procedure well and was later transferred to the recovery room and then  to the orthopaedic floor for postoperative care. They were given PO and IV analgesics for pain control following their surgery. They were given 24 hours of postoperative antibiotics of  Anti-infectives (From admission, onward)   Start     Dose/Rate Route Frequency Ordered Stop   04/07/18 1800  ceFAZolin (ANCEF) IVPB 2g/100 mL premix     2 g 200 mL/hr over 30 Minutes Intravenous Every 6 hours 04/07/18 1507 04/08/18 0018   04/07/18 1030  ceFAZolin (ANCEF) IVPB 2g/100 mL premix     2 g 200 mL/hr over 30 Minutes Intravenous On call to O.R. 04/07/18 1017 04/07/18 1134     and started on DVT prophylaxis in the form of Xarelto.   PT and OT were ordered for total joint protocol. Discharge planning consulted to help with postop disposition and equipment needs. Patient had a good night on the evening of surgery. They started to get up OOB with therapy on POD #0. Hemovac drain was pulled without difficulty on day one. Continued to work with therapy into POD #2. Pt was seen during rounds on day two and was ready to go home pending progress with therapy. Dressing was changed and the incision was clean, dry, and intact with no drainage. Pt worked with therapy for two additional sessions and was meeting their goals. He was discharged to home later that day in stable condition.  Diet: Cardiac diet Activity: WBAT Follow-up: in 2 weeks Disposition: Home with HEP Discharged Condition: stable   Discharge Instructions    Call MD / Call 911   Complete by:  As directed    If you experience chest pain or shortness of breath, CALL 911 and be transported to the hospital emergency room.  If you develope a fever above 101 F, pus (white drainage) or increased drainage or redness at the wound, or calf pain, call your surgeon's office.   Change dressing   Complete by:  As directed    You may change your dressing on Friday, then change the dressing daily with sterile 4 x 4 inch gauze dressing and paper tape.    Constipation Prevention   Complete by:  As directed    Drink plenty of fluids.  Prune juice may be helpful.  You may use a stool softener, such as Colace (over the counter) 100 mg twice a day.  Use MiraLax (over the counter) for constipation as needed.   Diet - low sodium heart healthy   Complete by:  As directed    Discharge instructions   Complete by:  As directed    Dr. Gaynelle Arabian Total Joint Specialist [facility] [address], [place], [place] [address] [phone]  ANTERIOR APPROACH TOTAL HIP REPLACEMENT POSTOPERATIVE DIRECTIONS   Hip Rehabilitation, Guidelines Following Surgery  The results of a hip operation are greatly improved after range of motion and muscle strengthening exercises. Follow all safety measures which are given to protect your hip. If any of these exercises cause increased pain or swelling in your joint, decrease the amount until you are comfortable again. Then slowly increase the exercises. Call your caregiver if you have problems or questions.   HOME CARE INSTRUCTIONS  Remove items at home which could result in a fall. This includes throw rugs or furniture in walking pathways.  ICE to the affected hip every three hours for 30 minutes at a time and then as needed for pain and swelling.  Continue to use ice on the hip for pain and swelling from surgery. You may notice swelling that will progress down to the foot and ankle.  This is normal after surgery.  Elevate the leg when you are not up walking on it.   Continue to use the breathing machine which will help keep your temperature down.  It is common for your temperature to cycle up and down following surgery, especially at night when you are not up moving around and exerting yourself.  The breathing machine keeps your lungs expanded and your temperature down.  DIET You may resume your previous home diet once your are discharged from the hospital.  DRESSING / WOUND CARE / SHOWERING You may  shower 3 days after surgery, but keep the wounds dry during showering.  You may use an occlusive plastic wrap (Press'n Seal for example), NO SOAKING/SUBMERGING IN THE BATHTUB.  If the bandage gets wet, change with a clean dry gauze.  If the incision gets wet, pat the wound dry with a clean towel. You may start showering once you are discharged home but do not submerge the incision under water. Just pat the incision dry and apply a dry gauze dressing on daily. Change the surgical dressing daily and reapply a dry dressing each time.  ACTIVITY Walk with your walker as instructed. Use walker as long as suggested by your caregivers. Avoid periods of inactivity such as sitting longer than an hour when not asleep. This helps prevent blood clots.  You may resume a sexual relationship in one month or when given the OK by your doctor.  You may return to work once you are cleared by your doctor.  Do not drive a car for 6 weeks or until released by you surgeon.  Do not drive while taking narcotics.  WEIGHT BEARING Weight bearing as tolerated with assist device (walker, cane, etc) as directed, use it as long as suggested by your surgeon or therapist, typically at least 4-6 weeks.  POSTOPERATIVE CONSTIPATION PROTOCOL Constipation - defined medically as fewer than three stools per week and severe constipation as less than one stool per week.  One of the most common issues patients have following surgery is constipation.  Even if you have a regular bowel pattern at home, your normal regimen is likely to be disrupted due to multiple reasons following surgery.  Combination of anesthesia, postoperative narcotics, change in appetite and fluid intake all can affect your bowels.  In order to avoid complications following surgery, here are some recommendations in order to help you during your recovery period.  Colace (docusate) - Pick up an over-the-counter form of Colace or another stool softener and take twice a day  as long as you are requiring postoperative pain medications.  Take with a full glass of water daily.  If you experience loose stools or  diarrhea, hold the colace until you stool forms back up.  If your symptoms do not get better within 1 week or if they get worse, check with your doctor.  Dulcolax (bisacodyl) - Pick up over-the-counter and take as directed by the product packaging as needed to assist with the movement of your bowels.  Take with a full glass of water.  Use this product as needed if not relieved by Colace only.   MiraLax (polyethylene glycol) - Pick up over-the-counter to have on hand.  MiraLax is a solution that will increase the amount of water in your bowels to assist with bowel movements.  Take as directed and can mix with a glass of water, juice, soda, coffee, or tea.  Take if you go more than two days without a movement. Do not use MiraLax more than once per day. Call your doctor if you are still constipated or irregular after using this medication for 7 days in a row.  If you continue to have problems with postoperative constipation, please contact the office for further assistance and recommendations.  If you experience "the worst abdominal pain ever" or develop nausea or vomiting, please contact the office immediatly for further recommendations for treatment.  ITCHING  If you experience itching with your medications, try taking only a single pain pill, or even half a pain pill at a time.  You can also use Benadryl over the counter for itching or also to help with sleep.   TED HOSE STOCKINGS Wear the elastic stockings on both legs for three weeks following surgery during the day but you may remove then at night for sleeping.  MEDICATIONS See your medication summary on the "After Visit Summary" that the nursing staff will review with you prior to discharge.  You may have some home medications which will be placed on hold until you complete the course of blood thinner medication.   It is important for you to complete the blood thinner medication as prescribed by your surgeon.  Continue your approved medications as instructed at time of discharge.  PRECAUTIONS If you experience chest pain or shortness of breath - call 911 immediately for transfer to the hospital emergency department.  If you develop a fever greater that 101 F, purulent drainage from wound, increased redness or drainage from wound, foul odor from the wound/dressing, or calf pain - CONTACT YOUR SURGEON.                                                   FOLLOW-UP APPOINTMENTS Make sure you keep all of your appointments after your operation with your surgeon and caregivers. You should call the office at the above phone number and make an appointment for approximately two weeks after the date of your surgery or on the date instructed by your surgeon outlined in the "After Visit Summary".  RANGE OF MOTION AND STRENGTHENING EXERCISES  These exercises are designed to help you keep full movement of your hip joint. Follow your caregiver's or physical therapist's instructions. Perform all exercises about fifteen times, three times per day or as directed. Exercise both hips, even if you have had only one joint replacement. These exercises can be done on a training (exercise) mat, on the floor, on a table or on a bed. Use whatever works the best and is most comfortable for you. Use  music or television while you are exercising so that the exercises are a pleasant break in your day. This will make your life better with the exercises acting as a break in routine you can look forward to.  Lying on your back, slowly slide your foot toward your buttocks, raising your knee up off the floor. Then slowly slide your foot back down until your leg is straight again.  Lying on your back spread your legs as far apart as you can without causing discomfort.  Lying on your side, raise your upper leg and foot straight up from the floor as far  as is comfortable. Slowly lower the leg and repeat.  Lying on your back, tighten up the muscle in the front of your thigh (quadriceps muscles). You can do this by keeping your leg straight and trying to raise your heel off the floor. This helps strengthen the largest muscle supporting your knee.  Lying on your back, tighten up the muscles of your buttocks both with the legs straight and with the knee bent at a comfortable angle while keeping your heel on the floor.   IF YOU ARE TRANSFERRED TO A SKILLED REHAB FACILITY If the patient is transferred to a skilled rehab facility following release from the hospital, a list of the current medications will be sent to the facility for the patient to continue.  When discharged from the skilled rehab facility, please have the facility set up the patient's [place] prior to being released. Also, the skilled facility will be responsible for providing the patient with their medications at time of release from the facility to include their pain medication, the muscle relaxants, and their blood thinner medication. If the patient is still at the rehab facility at time of the two week follow up appointment, the skilled rehab facility will also need to assist the patient in arranging follow up appointment in our office and any transportation needs.  MAKE SURE YOU:  Understand these instructions.  Get help right away if you are not doing well or get worse.    Pick up stool softner and laxative for home use following surgery while on pain medications. Do not submerge incision under water. Please use good hand washing techniques while changing dressing each day. May shower starting three days after surgery. Please use a clean towel to pat the incision dry following showers. Continue to use ice for pain and swelling after surgery. Do not use any lotions or creams on the incision until instructed by your surgeon.   Do not sit on low chairs, stoools or  toilet seats, as it may be difficult to get up from low surfaces   Complete by:  As directed    Driving restrictions   Complete by:  As directed    No driving for two weeks   TED hose   Complete by:  As directed    Use stockings (TED hose) for three weeks on both leg(s).  You may remove them at night for sleeping.   Weight bearing as tolerated   Complete by:  As directed      Allergies as of 04/09/2018      Reactions   Morphine Itching, Hives      Medication List    STOP taking these medications   aspirin EC 81 MG tablet   multivitamin with minerals Tabs tablet   vitamin C 1000 MG tablet     TAKE these medications   albuterol 108 (90 Base)  MCG/ACT inhaler Commonly known as:  PROAIR HFA Inhale 2 puffs into the lungs every 6 (six) hours as needed for wheezing or shortness of breath.   budesonide-formoterol 80-4.5 MCG/ACT inhaler Commonly known as:  SYMBICORT Inhale 2 puffs into the lungs 2 (two) times daily.   carvedilol 3.125 MG tablet Commonly known as:  COREG TAKE 1 TABLET (3.125 MG) BY MOUTH TWICE DAILY   cetirizine 10 MG tablet Commonly known as:  ZYRTEC Take 10 mg by mouth at bedtime.   DULoxetine 60 MG capsule Commonly known as:  CYMBALTA Take 60 mg by mouth daily.   furosemide 40 MG tablet Commonly known as:  LASIX Take 1 tablet (40 mg total) by mouth daily.   HYDROcodone-acetaminophen 5-325 MG tablet Commonly known as:  NORCO/VICODIN Take 1-2 tablets by mouth every 6 (six) hours as needed for moderate pain (pain score 4-6).   losartan 50 MG tablet Commonly known as:  COZAAR Take 1 tablet (50 mg total) by mouth daily.   methocarbamol 500 MG tablet Commonly known as:  ROBAXIN Take 1 tablet (500 mg total) by mouth every 6 (six) hours as needed for muscle spasms.   nitroGLYCERIN 0.4 MG SL tablet Commonly known as:  NITROSTAT PLACE 1 TABLET UNDER THE TONGUE EVERY 5 MINUTES AS NEEDED FOR CHEST PAIN UP TO 3 DOSES, IF SYMPTOMS PERSIST CALL 911     pantoprazole 40 MG tablet Commonly known as:  PROTONIX Take 1 tablet (40 mg total) by mouth 2 (two) times daily.   potassium chloride 10 MEQ tablet Commonly known as:  K-DUR Take 1 tablet (10 mEq total) by mouth daily.   predniSONE 10 MG tablet Commonly known as:  DELTASONE Take one daily - take two if breathing worse, both at breakfast What changed:    how much to take  how to take this  when to take this   rivaroxaban 10 MG Tabs tablet Commonly known as:  XARELTO Take 1 tablet (10 mg total) by mouth daily with breakfast for 20 days. Then resume one 81 mg aspirin once a day.   rosuvastatin 40 MG tablet Commonly known as:  CRESTOR TAKE 1 TABLET BY MOUTH DAILY What changed:  when to take this            Discharge Care Instructions  (From admission, onward)         Start     Ordered   04/08/18 0000  Weight bearing as tolerated     04/08/18 0754   04/08/18 0000  Change dressing    Comments:  You may change your dressing on Friday, then change the dressing daily with sterile 4 x 4 inch gauze dressing and paper tape.   04/08/18 0754         Follow-up Information    Gaynelle Arabian, MD. Schedule an appointment as soon as possible for a visit on 04/22/2018.   Specialty:  Orthopedic Surgery Contact information: [address] [place] [address] [phone]           Signed: Theresa Duty, PA-C Orthopedic Surgery 04/12/2018, 9:10 AM

## 2018-04-14 ENCOUNTER — Other Ambulatory Visit: Payer: Self-pay | Admitting: Internal Medicine

## 2018-05-05 DIAGNOSIS — M79675 Pain in left toe(s): Secondary | ICD-10-CM | POA: Diagnosis not present

## 2018-05-05 DIAGNOSIS — M79674 Pain in right toe(s): Secondary | ICD-10-CM | POA: Diagnosis not present

## 2018-05-05 DIAGNOSIS — B351 Tinea unguium: Secondary | ICD-10-CM | POA: Diagnosis not present

## 2018-05-05 DIAGNOSIS — L600 Ingrowing nail: Secondary | ICD-10-CM | POA: Diagnosis not present

## 2018-05-13 DIAGNOSIS — D225 Melanocytic nevi of trunk: Secondary | ICD-10-CM | POA: Diagnosis not present

## 2018-05-13 DIAGNOSIS — Z85820 Personal history of malignant melanoma of skin: Secondary | ICD-10-CM | POA: Diagnosis not present

## 2018-05-13 DIAGNOSIS — D0361 Melanoma in situ of right upper limb, including shoulder: Secondary | ICD-10-CM | POA: Diagnosis not present

## 2018-05-13 DIAGNOSIS — R238 Other skin changes: Secondary | ICD-10-CM | POA: Diagnosis not present

## 2018-05-13 DIAGNOSIS — L820 Inflamed seborrheic keratosis: Secondary | ICD-10-CM | POA: Diagnosis not present

## 2018-05-13 DIAGNOSIS — L814 Other melanin hyperpigmentation: Secondary | ICD-10-CM | POA: Diagnosis not present

## 2018-05-13 DIAGNOSIS — L570 Actinic keratosis: Secondary | ICD-10-CM | POA: Diagnosis not present

## 2018-05-13 DIAGNOSIS — L821 Other seborrheic keratosis: Secondary | ICD-10-CM | POA: Diagnosis not present

## 2018-05-13 DIAGNOSIS — C4442 Squamous cell carcinoma of skin of scalp and neck: Secondary | ICD-10-CM | POA: Diagnosis not present

## 2018-05-13 DIAGNOSIS — D485 Neoplasm of uncertain behavior of skin: Secondary | ICD-10-CM | POA: Diagnosis not present

## 2018-05-13 DIAGNOSIS — L989 Disorder of the skin and subcutaneous tissue, unspecified: Secondary | ICD-10-CM | POA: Diagnosis not present

## 2018-05-18 ENCOUNTER — Other Ambulatory Visit: Payer: Self-pay | Admitting: Cardiovascular Disease

## 2018-05-18 ENCOUNTER — Ambulatory Visit: Payer: PPO | Admitting: Internal Medicine

## 2018-05-18 DIAGNOSIS — Z96642 Presence of left artificial hip joint: Secondary | ICD-10-CM | POA: Diagnosis not present

## 2018-05-18 DIAGNOSIS — Z471 Aftercare following joint replacement surgery: Secondary | ICD-10-CM | POA: Diagnosis not present

## 2018-06-03 DIAGNOSIS — R2689 Other abnormalities of gait and mobility: Secondary | ICD-10-CM | POA: Diagnosis not present

## 2018-06-03 DIAGNOSIS — M79675 Pain in left toe(s): Secondary | ICD-10-CM | POA: Diagnosis not present

## 2018-06-03 DIAGNOSIS — G2581 Restless legs syndrome: Secondary | ICD-10-CM | POA: Diagnosis not present

## 2018-06-03 DIAGNOSIS — D485 Neoplasm of uncertain behavior of skin: Secondary | ICD-10-CM | POA: Diagnosis not present

## 2018-06-03 DIAGNOSIS — F411 Generalized anxiety disorder: Secondary | ICD-10-CM | POA: Diagnosis not present

## 2018-06-03 DIAGNOSIS — E785 Hyperlipidemia, unspecified: Secondary | ICD-10-CM | POA: Diagnosis not present

## 2018-06-03 DIAGNOSIS — Z780 Asymptomatic menopausal state: Secondary | ICD-10-CM | POA: Diagnosis not present

## 2018-06-03 DIAGNOSIS — B351 Tinea unguium: Secondary | ICD-10-CM | POA: Diagnosis not present

## 2018-06-03 DIAGNOSIS — E7800 Pure hypercholesterolemia, unspecified: Secondary | ICD-10-CM | POA: Diagnosis not present

## 2018-06-03 DIAGNOSIS — K8020 Calculus of gallbladder without cholecystitis without obstruction: Secondary | ICD-10-CM | POA: Diagnosis not present

## 2018-06-03 DIAGNOSIS — Z23 Encounter for immunization: Secondary | ICD-10-CM | POA: Diagnosis not present

## 2018-06-03 DIAGNOSIS — K219 Gastro-esophageal reflux disease without esophagitis: Secondary | ICD-10-CM | POA: Diagnosis not present

## 2018-06-03 DIAGNOSIS — M79674 Pain in right toe(s): Secondary | ICD-10-CM | POA: Diagnosis not present

## 2018-06-03 DIAGNOSIS — F339 Major depressive disorder, recurrent, unspecified: Secondary | ICD-10-CM | POA: Diagnosis not present

## 2018-06-03 DIAGNOSIS — L600 Ingrowing nail: Secondary | ICD-10-CM | POA: Diagnosis not present

## 2018-06-03 DIAGNOSIS — Z0000 Encounter for general adult medical examination without abnormal findings: Secondary | ICD-10-CM | POA: Diagnosis not present

## 2018-06-15 ENCOUNTER — Telehealth: Payer: Self-pay | Admitting: Cardiovascular Disease

## 2018-06-15 ENCOUNTER — Other Ambulatory Visit: Payer: Self-pay | Admitting: Nurse Practitioner

## 2018-06-15 MED ORDER — LOSARTAN POTASSIUM 50 MG PO TABS: 50.0000 mg | ORAL_TABLET | Freq: Every day | ORAL | 3 refills | Status: AC

## 2018-06-15 NOTE — Telephone Encounter (Signed)
°*  STAT* If patient is at the pharmacy, call can be transferred to refill team.   1. Which medications need to be refilled? (please list name of each medication and dose if known) Losartan 50 mg  2. Which pharmacy/location (including street and city if local pharmacy) is medication to be sent to? [facility]  3. Do they need a 30 day or 90 day supply? [place]

## 2018-06-16 DIAGNOSIS — D485 Neoplasm of uncertain behavior of skin: Secondary | ICD-10-CM | POA: Diagnosis not present

## 2018-06-16 DIAGNOSIS — D0361 Melanoma in situ of right upper limb, including shoulder: Secondary | ICD-10-CM | POA: Diagnosis not present

## 2018-06-16 DIAGNOSIS — L989 Disorder of the skin and subcutaneous tissue, unspecified: Secondary | ICD-10-CM | POA: Diagnosis not present

## 2018-06-16 DIAGNOSIS — C44321 Squamous cell carcinoma of skin of nose: Secondary | ICD-10-CM | POA: Diagnosis not present

## 2018-06-16 DIAGNOSIS — D0461 Carcinoma in situ of skin of right upper limb, including shoulder: Secondary | ICD-10-CM | POA: Diagnosis not present

## 2018-06-16 DIAGNOSIS — C4442 Squamous cell carcinoma of skin of scalp and neck: Secondary | ICD-10-CM | POA: Diagnosis not present

## 2018-07-26 DEATH — deceased

## 2018-08-23 ENCOUNTER — Ambulatory Visit: Payer: PPO | Admitting: Internal Medicine

## 2019-06-17 IMAGING — RF DG HIP (WITH PELVIS) OPERATIVE*L*
1 series · 1 of 1 positions shown · non-contrast
Comparison: None.

CLINICAL DATA: ANTERIOR approach LEFT total hip arthroplasty.

EXAM:
OPERATIVE LEFT HIP (WITH PELVIS IF PERFORMED) 1 VIEW
TECHNIQUE: A single fluoroscopic spot image was submitted for interpretation
post-operatively.

[Series 1: unknown protocol · 1 of 1 slices shown]
[im 1/1]
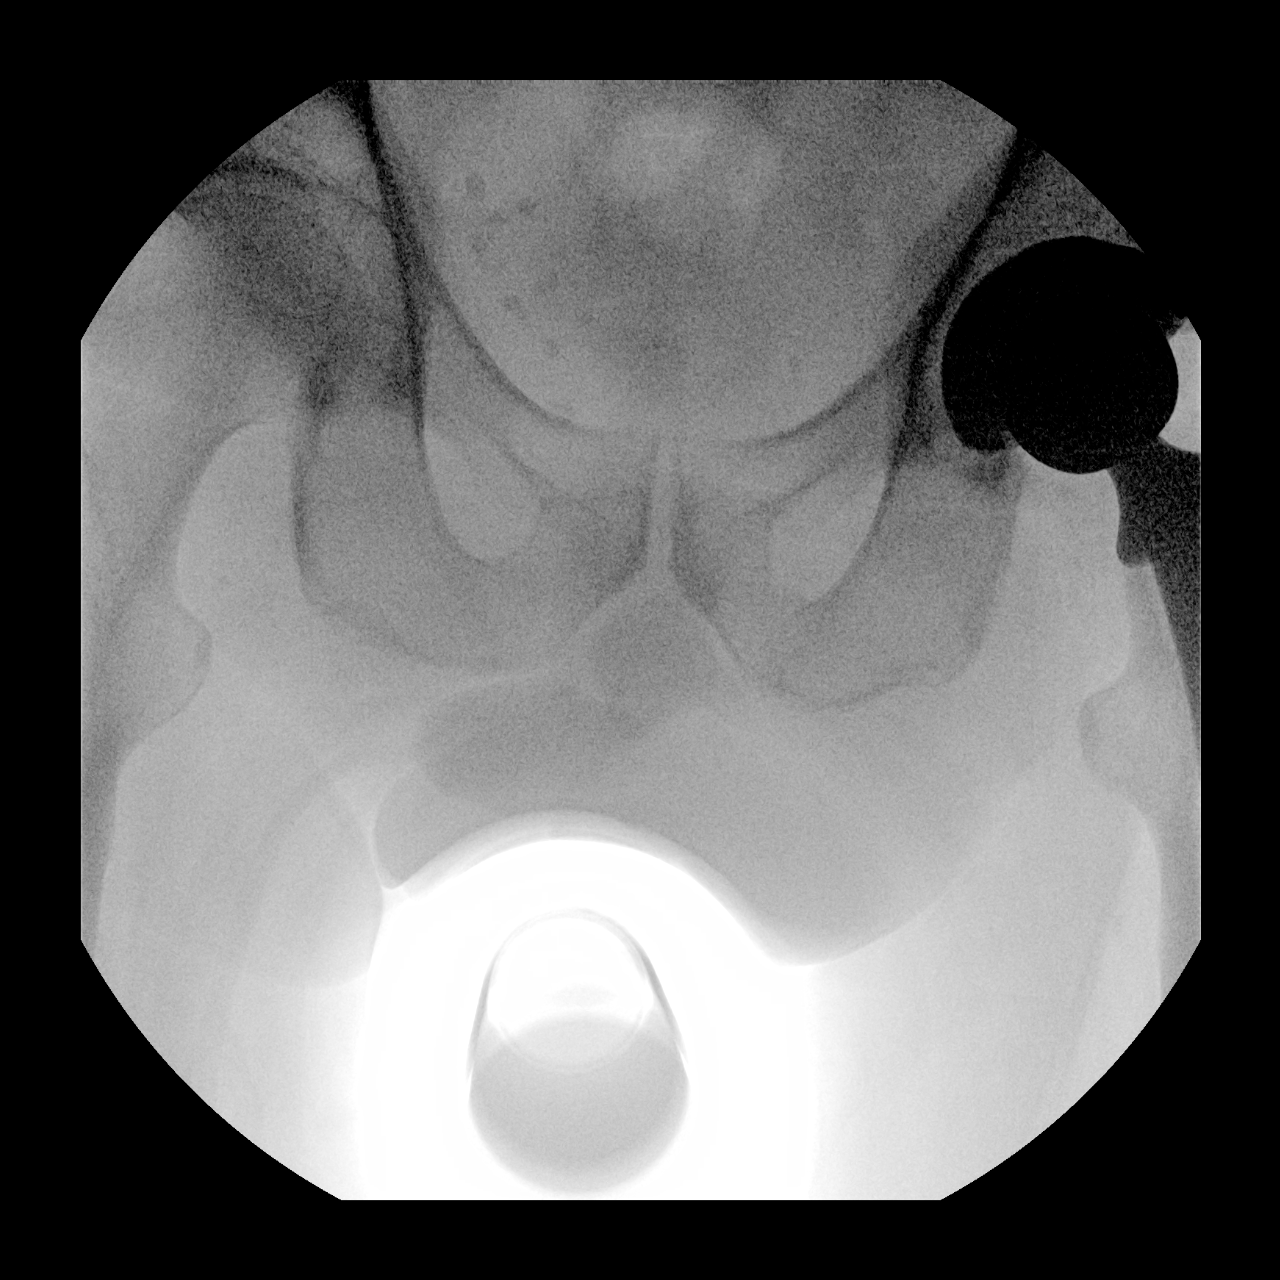

[1 of 1 positions shown; findings below may reference images not displayed]

FINDINGS: Single AP spot image over the LEFT hip demonstrates anatomic
alignment of the LEFT hip prosthesis. The prosthesis is incompletely
imaged on the spot image.

The fluoroscopic device documented 8 seconds of fluoroscopy time
with a patient dose of 0.56 mGy.
IMPRESSION: Anatomic alignment of the LEFT hip prosthesis in the AP projection.
